# Patient Record
Sex: Female | Born: 1940 | Race: White | Hispanic: No | Marital: Married | Smoking: Never smoker
Health system: Southern US, Community
[De-identification: ages and names within clinical notes are randomized; demographics above are authoritative.]

## PROBLEM LIST (undated history)

## (undated) DIAGNOSIS — K219 Gastro-esophageal reflux disease without esophagitis: Secondary | ICD-10-CM

## (undated) DIAGNOSIS — Z9889 Other specified postprocedural states: Secondary | ICD-10-CM

## (undated) DIAGNOSIS — T7840XA Allergy, unspecified, initial encounter: Secondary | ICD-10-CM

## (undated) DIAGNOSIS — I219 Acute myocardial infarction, unspecified: Secondary | ICD-10-CM

## (undated) DIAGNOSIS — E7800 Pure hypercholesterolemia, unspecified: Secondary | ICD-10-CM

## (undated) DIAGNOSIS — T8859XA Other complications of anesthesia, initial encounter: Secondary | ICD-10-CM

## (undated) DIAGNOSIS — R0600 Dyspnea, unspecified: Secondary | ICD-10-CM

## (undated) DIAGNOSIS — K760 Fatty (change of) liver, not elsewhere classified: Secondary | ICD-10-CM

## (undated) DIAGNOSIS — H3530 Unspecified macular degeneration: Secondary | ICD-10-CM

## (undated) DIAGNOSIS — M1990 Unspecified osteoarthritis, unspecified site: Secondary | ICD-10-CM

## (undated) DIAGNOSIS — Z8051 Family history of malignant neoplasm of kidney: Secondary | ICD-10-CM

## (undated) DIAGNOSIS — G4730 Sleep apnea, unspecified: Secondary | ICD-10-CM

## (undated) DIAGNOSIS — R519 Headache, unspecified: Secondary | ICD-10-CM

## (undated) DIAGNOSIS — R112 Nausea with vomiting, unspecified: Secondary | ICD-10-CM

## (undated) DIAGNOSIS — Z806 Family history of leukemia: Secondary | ICD-10-CM

## (undated) DIAGNOSIS — Z803 Family history of malignant neoplasm of breast: Secondary | ICD-10-CM

## (undated) DIAGNOSIS — C50919 Malignant neoplasm of unspecified site of unspecified female breast: Secondary | ICD-10-CM

## (undated) DIAGNOSIS — I209 Angina pectoris, unspecified: Secondary | ICD-10-CM

## (undated) DIAGNOSIS — Z923 Personal history of irradiation: Secondary | ICD-10-CM

## (undated) DIAGNOSIS — I2510 Atherosclerotic heart disease of native coronary artery without angina pectoris: Secondary | ICD-10-CM

## (undated) DIAGNOSIS — H409 Unspecified glaucoma: Secondary | ICD-10-CM

## (undated) DIAGNOSIS — F419 Anxiety disorder, unspecified: Secondary | ICD-10-CM

## (undated) DIAGNOSIS — I10 Essential (primary) hypertension: Secondary | ICD-10-CM

## (undated) DIAGNOSIS — T4145XA Adverse effect of unspecified anesthetic, initial encounter: Secondary | ICD-10-CM

## (undated) DIAGNOSIS — J45909 Unspecified asthma, uncomplicated: Secondary | ICD-10-CM

## (undated) DIAGNOSIS — Z807 Family history of other malignant neoplasms of lymphoid, hematopoietic and related tissues: Secondary | ICD-10-CM

## (undated) HISTORY — DX: Family history of other malignant neoplasms of lymphoid, hematopoietic and related tissues: Z80.7

## (undated) HISTORY — PX: MASTECTOMY: SHX3

## (undated) HISTORY — DX: Family history of malignant neoplasm of breast: Z80.3

## (undated) HISTORY — DX: Malignant neoplasm of unspecified site of unspecified female breast: C50.919

## (undated) HISTORY — DX: Family history of leukemia: Z80.6

## (undated) HISTORY — PX: ACHILLES TENDON SURGERY: SHX542

## (undated) HISTORY — PX: TRIGGER FINGER RELEASE: SHX641

## (undated) HISTORY — PX: CORONARY ANGIOPLASTY: SHX604

## (undated) HISTORY — PX: OTHER SURGICAL HISTORY: SHX169

## (undated) HISTORY — PX: BUNIONECTOMY: SHX129

## (undated) HISTORY — PX: ABDOMINAL HYSTERECTOMY: SHX81

## (undated) HISTORY — DX: Family history of malignant neoplasm of kidney: Z80.51

---

## 2004-07-12 ENCOUNTER — Ambulatory Visit: Payer: Self-pay

## 2005-08-02 ENCOUNTER — Ambulatory Visit: Payer: Self-pay | Admitting: Internal Medicine

## 2005-08-10 ENCOUNTER — Ambulatory Visit: Payer: Self-pay | Admitting: Internal Medicine

## 2005-11-02 ENCOUNTER — Ambulatory Visit: Payer: Self-pay | Admitting: Gastroenterology

## 2005-11-28 ENCOUNTER — Ambulatory Visit: Payer: Self-pay | Admitting: Internal Medicine

## 2007-08-05 ENCOUNTER — Encounter: Admission: RE | Admit: 2007-08-05 | Discharge: 2007-08-05 | Payer: Self-pay | Admitting: Gynecology

## 2008-02-05 ENCOUNTER — Ambulatory Visit: Payer: Self-pay | Admitting: Internal Medicine

## 2008-05-16 ENCOUNTER — Ambulatory Visit: Payer: Self-pay | Admitting: Internal Medicine

## 2008-11-20 ENCOUNTER — Ambulatory Visit: Payer: Self-pay | Admitting: Family Medicine

## 2009-06-23 ENCOUNTER — Encounter: Payer: Self-pay | Admitting: Internal Medicine

## 2009-06-27 ENCOUNTER — Encounter: Payer: Self-pay | Admitting: Internal Medicine

## 2009-07-06 DIAGNOSIS — R002 Palpitations: Secondary | ICD-10-CM | POA: Insufficient documentation

## 2009-07-06 DIAGNOSIS — M109 Gout, unspecified: Secondary | ICD-10-CM

## 2009-07-06 DIAGNOSIS — I10 Essential (primary) hypertension: Secondary | ICD-10-CM | POA: Insufficient documentation

## 2009-07-08 ENCOUNTER — Ambulatory Visit: Payer: Self-pay | Admitting: Internal Medicine

## 2009-07-08 DIAGNOSIS — I6350 Cerebral infarction due to unspecified occlusion or stenosis of unspecified cerebral artery: Secondary | ICD-10-CM | POA: Insufficient documentation

## 2009-07-14 ENCOUNTER — Telehealth (INDEPENDENT_AMBULATORY_CARE_PROVIDER_SITE_OTHER): Payer: Self-pay | Admitting: *Deleted

## 2009-07-16 ENCOUNTER — Encounter: Admission: RE | Admit: 2009-07-16 | Discharge: 2009-07-16 | Payer: Self-pay | Admitting: Internal Medicine

## 2009-07-19 ENCOUNTER — Telehealth: Payer: Self-pay | Admitting: Internal Medicine

## 2009-08-05 ENCOUNTER — Ambulatory Visit: Payer: Self-pay

## 2009-08-05 ENCOUNTER — Ambulatory Visit: Payer: Self-pay | Admitting: Internal Medicine

## 2009-08-05 ENCOUNTER — Ambulatory Visit: Payer: Self-pay | Admitting: Cardiology

## 2009-08-05 ENCOUNTER — Ambulatory Visit (HOSPITAL_COMMUNITY): Admission: RE | Admit: 2009-08-05 | Discharge: 2009-08-05 | Payer: Self-pay | Admitting: Internal Medicine

## 2009-08-09 ENCOUNTER — Telehealth: Payer: Self-pay | Admitting: Internal Medicine

## 2009-08-10 ENCOUNTER — Encounter: Admission: RE | Admit: 2009-08-10 | Discharge: 2009-08-10 | Payer: Self-pay | Admitting: Gynecology

## 2009-08-10 ENCOUNTER — Telehealth: Payer: Self-pay | Admitting: Internal Medicine

## 2009-08-11 ENCOUNTER — Encounter: Payer: Self-pay | Admitting: Internal Medicine

## 2010-03-19 LAB — CONVERTED CEMR LAB
AST: 18 units/L (ref 0–37)
Alkaline Phosphatase: 65 units/L (ref 39–117)
Bilirubin, Direct: 0.1 mg/dL (ref 0.0–0.3)
Creatinine, Ser: 0.59 mg/dL (ref 0.40–1.20)
Direct LDL: 142.2 mg/dL
Glucose, Bld: 84 mg/dL (ref 70–99)
HDL: 45.9 mg/dL (ref >39.00)
Potassium: 4.5 meq/L (ref 3.5–5.3)
Sodium: 141 meq/L (ref 135–145)
Total CHOL/HDL Ratio: 5
Total Protein: 6.8 g/dL (ref 6.0–8.3)
VLDL: 42.8 mg/dL — ABNORMAL HIGH (ref 0.0–40.0)

## 2010-03-21 NOTE — Progress Notes (Signed)
Summary: [facility]   Imported By: Debby Freiberg 07/07/2009 15:39:45  _____________________________________________________________________  External Attachment:    Type:   Image     Comment:   External Document

## 2010-03-21 NOTE — Assessment & Plan Note (Signed)
Summary: np6/ chestpain-prince medal angina / pt has today options/ gd   Visit Type:  Initial Consult Referring Provider:  dr Champ Mungo (EYES) Primary Provider:  Dr Candelaria Stagers  CC:  headaches-chest pain- sob.  History of Present Illness: Patient is a 70 year old who was referred for evaluation of abnormal eye exam\par The pateitn was recently seen in the [facility] by Dr. Champ Mungo for visual problems.  SHe compained of seeing a gray cloud over her R eye. The patient says that it has improved but is still present.  Eye exam suggests a strricture in the artery feeding the retinal eara consistent with intermittent branch retinal artery occlusio, question temproary vs Prinzmetals.  The patient was told to take ASA SHe denies any other neurologic complaints.. The patient has no known CAD>  She has had episodes of chest pain that starts in the epigastric region.  Can radiate to neck.  Remote.  Has been relieved with Rolaids.  Current Medications (verified): 1)  Aspirin 81 Mg Tbec (Aspirin) .... Take One Tablet By Mouth Daily 2)  Vitamin D 2,000mg  .... Daily 3)  Travatan Z 0.004 % Soln (Travoprost) .... Eye Drops 4)  Metmucil .... Daily  Allergies (verified): 1)  ! * Mycin  Past History:  Past medical, surgical, family and social histories (including risk factors) reviewed, and no changes noted (except as noted below).  Past Medical History: Reviewed history from 07/06/2009 and no changes required. Current Problems:  * GLAUCOMA GOUT, UNSPECIFIED (ICD-274.9) HYPERTENSION (ICD-401.9) PALPITATIONS (ICD-785.1)  Past Surgical History: Reviewed history from 07/06/2009 and no changes required. hystrectomy 1987 achilhes tendon 2003 carpal tunnel 2002  Family History: Reviewed history from 07/06/2009 and no changes required. father deceased cancer age 77 mother deceased age 75 dememtia no siblings  Social History: Reviewed history from 07/06/2009 and no changes  required. retired , married, quit tobacco 1963, drinks occasional alcohol  Review of Systems       All systems reviewed.  Negative to the above problem.  Vital Signs:  Patient profile:   69 year old female Height:      62 inches Weight:      220 pounds BMI:     40.38 Pulse rate:   82 / minute Pulse rhythm:   irregular BP sitting:   119 / 78  (left arm) Cuff size:   large  Vitals Entered By: Burnett Kanaris, CNA (Jul 08, 2009 3:55 PM)  Physical Exam  Additional Exam:  Patient is in NAD HEENT:  Normocephalic, atraumatic. EOMI, PERRLA.  Neck: JVP is normal. No thyromegaly. No bruits.  Lungs: clear to auscultation. No rales no wheezes.  Heart: Regular rate and rhythm. Normal S1, S2. No S3.   No significant murmurs. PMI not displaced.  Abdomen:  Supple, nontender. Normal bowel sounds. No masses. No hepatomegaly.  Extremities:   Good distal pulses throughout. No lower extremity edema.  Musculoskeletal :moving all extremities.  Neuro:   alert and oriented x3.  CN II through XII intact with some cloudiness in fiedl from R eye.    Impression & Recommendations:  Problem # 1:  CVA (ICD-434.91) Ophthy exam with abnormality noted.  Patient without other symptoms.  Wil start with echo, carotd doppler, MRI/MRA brain.  Check lipids, ANA, BMET.    Problem # 2:  HYPERTENSION (ICD-401.9) BP good.  Not on meds.  Other Orders: EKG w/ Interpretation (93000) Carotid Duplex (Carotid Duplex) MRA (MRA) MRI (MRI) T-Basic Metabolic Panel (04540-98119) Echocardiogram (Echo)  Patient Instructions: 1)  Your physician has requested that you have an echocardiogram.  Echocardiography is a painless test that uses sound waves to create images of your heart. It provides your doctor with information about the size and shape of your heart and how well your heart's chambers and valves are working.  This procedure takes approximately one hour. There are no restrictions for this procedure. 2)  Your physician  has requested that you have a carotid duplex. This test is an ultrasound of the carotid arteries in your neck. It looks at blood flow through these arteries that supply the brain with blood. Allow one hour for this exam. There are no restrictions or special instructions. 3)  Your physician recommends that you return for a FASTING lipid profile:  day of echo 785.1 4)  MRI/MRA of brain 5)  Your physician wants you to follow-up in: 12 months  You will receive a reminder letter in the mail two months in advance. If you don't receive a letter, please call our office to schedule the follow-up appointment.   Appended Document: np6/ chestpain-prince medal angina / pt has today options/ gd EKG:  NSR.  82 bpm.  LVH.

## 2010-03-21 NOTE — Letter (Signed)
Summary: [facility] Office Note  [facility] Office Note   Imported By: Roderic Ovens 07/29/2009 11:44:10  _____________________________________________________________________  External Attachment:    Type:   Image     Comment:   External Document

## 2010-03-21 NOTE — Progress Notes (Signed)
  Phone Note Call from Patient Call back at Home Phone [phone] Call back at [phone]   Caller: Patient Reason for Call: Talk to Nurse Summary of Call: Want to know if Dr. Tenny Craw has seen her cartiod report yet and what is the next step.  Pt was in the office today.   Initial call taken by: Omar Person,  August 10, 2009 2:45 PM  Follow-up for Phone Call        Reviewed.  Patient needs referral to ophthy  ([facility]) here in [place].  Forward records.  Will need carotid f/u in 1 year. Follow-up by: Sherrill Raring, MD, [facility],  August 11, 2009 5:13 PM     Appended Document:  Called patient...she is aware. Will find out from Dr.Ross what doc she needs to see at [facility] for retina issue.  Appended Document:  Spoke with Dr. Champ Mungo.  Reviewed tests.  Will fax to him.  No signif abnormality found to explain eye findings. Continue ASA.  Begin Statin.  Patient would like Dr. Arlyce Dice to follow.  Wiill forward records to him as well as Beather Arbour and Dr. Champ Mungo. Pt will need f/u carotid [facility] in [place] in 1 year. F/U cardiology as needed.  Appended Document:  Patient aware of above...she does not want follow up labs here (cancelled appointment), she wants them at Dr.Chaplin's office in [place] ( called his nurse to inform) . All records faxed to Dr.Appenzeller,Dr.Chaplin,and Dr.Charles Lomax per patient's request.

## 2010-03-21 NOTE — Progress Notes (Signed)
Summary: Premed valium for MRI of Brain  Phone Note Call from Patient   Summary of Call: Pre med for MRI/MRA  Follow-up for Phone Call        Pt suffers from claustrophobia and needs Valium prior to procedure.  Called  in 2 doses of Valium 5mg  to pharmacy.   Follow-up by: J Jazzy Parmer RN    New/Updated Medications: VALIUM 5 MG TABS (DIAZEPAM) 1 tab 1 hour prior to MRI and 1 tab at testing center Prescriptions: VALIUM 5 MG TABS (DIAZEPAM) 1 tab 1 hour prior to MRI and 1 tab at testing center  #2 x 0   Entered by:   Layne Benton, RN, BSN   Authorized by:   Sherrill Raring, MD, [facility]   Signed by:   Layne Benton, RN, BSN on 07/14/2009   Method used:   Historical   RxID:   [id number]

## 2010-03-21 NOTE — Progress Notes (Signed)
Summary: MRI/MRA results  Phone Note Call from Patient Call back at [phone]   Caller: Patient Reason for Call: Talk to Nurse, Lab or Test Results Summary of Call: request results of MRI/MRA Initial call taken by: Migdalia Dk,  Jul 19, 2009 2:03 PM  Follow-up for Phone Call        Dr. Tenny Craw reviewing studies today. Follow-up by: Suzan Garibaldi RN  Additional Follow-up for Phone Call Additional follow up Details #1::        Dr.Ifeanyichukwu Wickham called patient with results. Additional Follow-up by: Suzan Garibaldi RN

## 2010-03-21 NOTE — Progress Notes (Signed)
  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 PLEASE ADVISE. Initial call taken by: Scherrie Bateman, LPN,  August 09, 2009 12:01 PM  Follow-up for Phone Call        I have reviewed already. Follow-up by: Sherrill Raring, MD, [facility],  August 09, 2009 3:37 PM     Appended Document:  Annice Pih to discuss with dr Tenny Craw today .  Appended Document:  See note from 08/11/2009.

## 2010-03-21 NOTE — Miscellaneous (Signed)
  Clinical Lists Changes  Medications: Added new medication of CRESTOR 5 MG TABS (ROSUVASTATIN CALCIUM) 1 tab every day - Signed Rx of CRESTOR 5 MG TABS (ROSUVASTATIN CALCIUM) 1 tab every day;  #30 x 4;  Signed;  Entered by: Layne Benton, RN, BSN;  Authorized by: Sherrill Raring, MD, [facility];  Method used: Electronically to [facility] [address], [place], [place]  [address], Ph: [phone], Fax: [phone]    Prescriptions: CRESTOR 5 MG TABS (ROSUVASTATIN CALCIUM) 1 tab every day  #30 x 4   Entered by:   Layne Benton, RN, BSN   Authorized by:   Sherrill Raring, MD, [facility]   Signed by:   Layne Benton, RN, BSN on 08/11/2009   Method used:   Electronically to        [facility]* (retail)       [address] [place], [place]  [address]       Ph: [phone]       Fax: [phone]   RxID:   [id number]

## 2010-04-15 ENCOUNTER — Ambulatory Visit: Payer: No Typology Code available for payment source | Admitting: Internal Medicine

## 2010-07-07 ENCOUNTER — Other Ambulatory Visit: Payer: Self-pay | Admitting: *Deleted

## 2010-07-07 MED ORDER — ROSUVASTATIN CALCIUM 5 MG PO TABS: 5.0000 mg | ORAL_TABLET | Freq: Every day | ORAL | Status: DC

## 2010-10-18 ENCOUNTER — Other Ambulatory Visit: Payer: Self-pay | Admitting: Gynecology

## 2010-10-18 DIAGNOSIS — Z1231 Encounter for screening mammogram for malignant neoplasm of breast: Secondary | ICD-10-CM

## 2010-11-01 ENCOUNTER — Ambulatory Visit
Admission: RE | Admit: 2010-11-01 | Discharge: 2010-11-01 | Disposition: A | Discharge status: Home / Self Care | Payer: No Typology Code available for payment source | Source: Ambulatory Visit | Attending: Gynecology | Admitting: Gynecology

## 2010-11-01 DIAGNOSIS — Z1231 Encounter for screening mammogram for malignant neoplasm of breast: Secondary | ICD-10-CM

## 2010-12-01 ENCOUNTER — Ambulatory Visit: Payer: No Typology Code available for payment source | Admitting: Urology

## 2010-12-05 ENCOUNTER — Ambulatory Visit: Payer: No Typology Code available for payment source | Admitting: Urology

## 2011-02-04 ENCOUNTER — Ambulatory Visit: Payer: No Typology Code available for payment source | Admitting: Internal Medicine

## 2011-05-09 ENCOUNTER — Ambulatory Visit: Payer: Self-pay

## 2011-08-11 ENCOUNTER — Emergency Department: Payer: Self-pay | Admitting: Emergency Medicine

## 2011-08-11 LAB — CBC
HGB: 16.8 g/dL — ABNORMAL HIGH (ref 12.0–16.0)
MCV: 93 fL (ref 80–100)
RBC: 5.21 10*6/uL — ABNORMAL HIGH (ref 3.80–5.20)
RDW: 12.8 % (ref 11.5–14.5)

## 2011-08-11 LAB — COMPREHENSIVE METABOLIC PANEL
Albumin: 3.8 g/dL (ref 3.4–5.0)
Alkaline Phosphatase: 72 U/L (ref 50–136)
Anion Gap: 10 (ref 7–16)
BUN: 15 mg/dL (ref 7–18)
Calcium, Total: 9 mg/dL (ref 8.5–10.1)
Chloride: 103 mmol/L (ref 98–107)
Co2: 26 mmol/L (ref 21–32)
EGFR (African American): >60
EGFR (Non-African Amer.): >60
SGPT (ALT): 32 U/L
Total Protein: 8.1 g/dL (ref 6.4–8.2)

## 2011-08-11 LAB — LIPASE, BLOOD: Lipase: 70 U/L — ABNORMAL LOW (ref 73–393)

## 2011-08-12 LAB — URINALYSIS, COMPLETE
Glucose,UR: 50 mg/dL (ref 0–75)
Leukocyte Esterase: NEGATIVE
Nitrite: NEGATIVE
Ph: 5 (ref 4.5–8.0)
Protein: 100
Specific Gravity: 1.033 (ref 1.003–1.030)
Squamous Epithelial: 3

## 2011-08-30 ENCOUNTER — Ambulatory Visit: Payer: Self-pay | Admitting: Unknown Physician Specialty

## 2011-11-05 ENCOUNTER — Other Ambulatory Visit: Payer: Self-pay | Admitting: Gynecology

## 2011-11-20 ENCOUNTER — Other Ambulatory Visit: Payer: Self-pay | Admitting: Gynecology

## 2011-11-20 DIAGNOSIS — Z1231 Encounter for screening mammogram for malignant neoplasm of breast: Secondary | ICD-10-CM

## 2011-12-25 ENCOUNTER — Ambulatory Visit
Admission: RE | Admit: 2011-12-25 | Discharge: 2011-12-25 | Disposition: A | Discharge status: Home / Self Care | Payer: Medicare Other | Source: Ambulatory Visit | Attending: Gynecology | Admitting: Gynecology

## 2011-12-25 DIAGNOSIS — Z1231 Encounter for screening mammogram for malignant neoplasm of breast: Secondary | ICD-10-CM

## 2012-03-31 ENCOUNTER — Ambulatory Visit: Payer: Self-pay | Admitting: Unknown Physician Specialty

## 2012-04-01 LAB — PATHOLOGY REPORT

## 2012-07-24 DIAGNOSIS — M545 Low back pain: Secondary | ICD-10-CM | POA: Insufficient documentation

## 2012-08-21 DIAGNOSIS — M654 Radial styloid tenosynovitis [de Quervain]: Secondary | ICD-10-CM | POA: Insufficient documentation

## 2012-12-26 ENCOUNTER — Other Ambulatory Visit: Payer: Self-pay

## 2012-12-26 DIAGNOSIS — Z1231 Encounter for screening mammogram for malignant neoplasm of breast: Secondary | ICD-10-CM

## 2013-01-27 ENCOUNTER — Ambulatory Visit: Payer: Medicare Other

## 2013-03-03 ENCOUNTER — Ambulatory Visit
Admission: RE | Admit: 2013-03-03 | Discharge: 2013-03-03 | Disposition: A | Discharge status: Home / Self Care | Payer: Medicare HMO | Source: Ambulatory Visit

## 2013-03-03 DIAGNOSIS — Z1231 Encounter for screening mammogram for malignant neoplasm of breast: Secondary | ICD-10-CM

## 2013-08-10 DIAGNOSIS — M5416 Radiculopathy, lumbar region: Secondary | ICD-10-CM | POA: Insufficient documentation

## 2013-08-10 DIAGNOSIS — M5136 Other intervertebral disc degeneration, lumbar region: Secondary | ICD-10-CM | POA: Insufficient documentation

## 2013-08-10 DIAGNOSIS — M51369 Other intervertebral disc degeneration, lumbar region without mention of lumbar back pain or lower extremity pain: Secondary | ICD-10-CM | POA: Insufficient documentation

## 2014-03-01 DIAGNOSIS — H5201 Hypermetropia, right eye: Secondary | ICD-10-CM | POA: Diagnosis not present

## 2014-03-01 DIAGNOSIS — H3531 Nonexudative age-related macular degeneration: Secondary | ICD-10-CM | POA: Diagnosis not present

## 2014-03-01 DIAGNOSIS — H401231 Low-tension glaucoma, bilateral, mild stage: Secondary | ICD-10-CM | POA: Diagnosis not present

## 2014-03-01 DIAGNOSIS — H5212 Myopia, left eye: Secondary | ICD-10-CM | POA: Diagnosis not present

## 2014-03-01 DIAGNOSIS — H2513 Age-related nuclear cataract, bilateral: Secondary | ICD-10-CM | POA: Diagnosis not present

## 2014-03-01 DIAGNOSIS — H52222 Regular astigmatism, left eye: Secondary | ICD-10-CM | POA: Diagnosis not present

## 2014-03-01 DIAGNOSIS — H524 Presbyopia: Secondary | ICD-10-CM | POA: Diagnosis not present

## 2014-04-09 DIAGNOSIS — L578 Other skin changes due to chronic exposure to nonionizing radiation: Secondary | ICD-10-CM | POA: Diagnosis not present

## 2014-04-09 DIAGNOSIS — D229 Melanocytic nevi, unspecified: Secondary | ICD-10-CM | POA: Diagnosis not present

## 2014-04-09 DIAGNOSIS — Z1283 Encounter for screening for malignant neoplasm of skin: Secondary | ICD-10-CM | POA: Diagnosis not present

## 2014-04-09 DIAGNOSIS — L853 Xerosis cutis: Secondary | ICD-10-CM | POA: Diagnosis not present

## 2014-04-09 DIAGNOSIS — D1800 Hemangioma unspecified site: Secondary | ICD-10-CM | POA: Diagnosis not present

## 2014-04-09 DIAGNOSIS — L570 Actinic keratosis: Secondary | ICD-10-CM | POA: Diagnosis not present

## 2014-04-09 DIAGNOSIS — L820 Inflamed seborrheic keratosis: Secondary | ICD-10-CM | POA: Diagnosis not present

## 2014-04-09 DIAGNOSIS — L821 Other seborrheic keratosis: Secondary | ICD-10-CM | POA: Diagnosis not present

## 2014-06-17 ENCOUNTER — Other Ambulatory Visit: Payer: Self-pay | Admitting: Physical Medicine and Rehabilitation

## 2014-06-17 DIAGNOSIS — M545 Low back pain: Secondary | ICD-10-CM

## 2014-07-01 DIAGNOSIS — M5441 Lumbago with sciatica, right side: Secondary | ICD-10-CM | POA: Diagnosis not present

## 2014-07-05 DIAGNOSIS — M5441 Lumbago with sciatica, right side: Secondary | ICD-10-CM | POA: Diagnosis not present

## 2014-07-06 ENCOUNTER — Ambulatory Visit
Admission: RE | Admit: 2014-07-06 | Discharge: 2014-07-06 | Disposition: A | Discharge status: Home / Self Care | Payer: Commercial Managed Care - HMO | Source: Ambulatory Visit | Attending: Physical Medicine and Rehabilitation | Admitting: Physical Medicine and Rehabilitation

## 2014-07-06 DIAGNOSIS — M5127 Other intervertebral disc displacement, lumbosacral region: Secondary | ICD-10-CM | POA: Diagnosis not present

## 2014-07-06 DIAGNOSIS — M545 Low back pain: Secondary | ICD-10-CM

## 2014-07-08 DIAGNOSIS — M5441 Lumbago with sciatica, right side: Secondary | ICD-10-CM | POA: Diagnosis not present

## 2014-07-13 DIAGNOSIS — M5416 Radiculopathy, lumbar region: Secondary | ICD-10-CM | POA: Diagnosis not present

## 2014-07-13 DIAGNOSIS — M5136 Other intervertebral disc degeneration, lumbar region: Secondary | ICD-10-CM | POA: Diagnosis not present

## 2014-07-14 DIAGNOSIS — R5383 Other fatigue: Secondary | ICD-10-CM | POA: Diagnosis not present

## 2014-07-14 DIAGNOSIS — R6883 Chills (without fever): Secondary | ICD-10-CM | POA: Diagnosis not present

## 2014-07-14 DIAGNOSIS — E780 Pure hypercholesterolemia: Secondary | ICD-10-CM | POA: Diagnosis not present

## 2014-07-15 DIAGNOSIS — R6883 Chills (without fever): Secondary | ICD-10-CM | POA: Diagnosis not present

## 2014-07-15 DIAGNOSIS — E780 Pure hypercholesterolemia: Secondary | ICD-10-CM | POA: Diagnosis not present

## 2014-07-15 DIAGNOSIS — R5383 Other fatigue: Secondary | ICD-10-CM | POA: Diagnosis not present

## 2014-07-16 DIAGNOSIS — R5383 Other fatigue: Secondary | ICD-10-CM | POA: Diagnosis not present

## 2014-07-16 DIAGNOSIS — R6883 Chills (without fever): Secondary | ICD-10-CM | POA: Diagnosis not present

## 2014-07-16 DIAGNOSIS — E780 Pure hypercholesterolemia: Secondary | ICD-10-CM | POA: Diagnosis not present

## 2014-07-20 DIAGNOSIS — M5441 Lumbago with sciatica, right side: Secondary | ICD-10-CM | POA: Diagnosis not present

## 2014-07-21 DIAGNOSIS — H2513 Age-related nuclear cataract, bilateral: Secondary | ICD-10-CM | POA: Diagnosis not present

## 2014-07-21 DIAGNOSIS — H401231 Low-tension glaucoma, bilateral, mild stage: Secondary | ICD-10-CM | POA: Diagnosis not present

## 2014-07-21 DIAGNOSIS — H5201 Hypermetropia, right eye: Secondary | ICD-10-CM | POA: Diagnosis not present

## 2014-07-21 DIAGNOSIS — H52222 Regular astigmatism, left eye: Secondary | ICD-10-CM | POA: Diagnosis not present

## 2014-07-21 DIAGNOSIS — H524 Presbyopia: Secondary | ICD-10-CM | POA: Diagnosis not present

## 2014-07-21 DIAGNOSIS — H3531 Nonexudative age-related macular degeneration: Secondary | ICD-10-CM | POA: Diagnosis not present

## 2014-07-21 DIAGNOSIS — H5212 Myopia, left eye: Secondary | ICD-10-CM | POA: Diagnosis not present

## 2014-07-23 ENCOUNTER — Other Ambulatory Visit: Payer: Self-pay

## 2014-07-23 DIAGNOSIS — Z1231 Encounter for screening mammogram for malignant neoplasm of breast: Secondary | ICD-10-CM

## 2014-07-23 DIAGNOSIS — M5441 Lumbago with sciatica, right side: Secondary | ICD-10-CM | POA: Diagnosis not present

## 2014-07-30 ENCOUNTER — Ambulatory Visit: Payer: Commercial Managed Care - HMO

## 2014-08-24 DIAGNOSIS — H40009 Preglaucoma, unspecified, unspecified eye: Secondary | ICD-10-CM | POA: Diagnosis not present

## 2014-08-26 DIAGNOSIS — M5416 Radiculopathy, lumbar region: Secondary | ICD-10-CM | POA: Diagnosis not present

## 2014-08-26 DIAGNOSIS — M5136 Other intervertebral disc degeneration, lumbar region: Secondary | ICD-10-CM | POA: Diagnosis not present

## 2014-09-01 ENCOUNTER — Ambulatory Visit
Admission: RE | Admit: 2014-09-01 | Discharge: 2014-09-01 | Disposition: A | Discharge status: Home / Self Care | Payer: Commercial Managed Care - HMO | Source: Ambulatory Visit

## 2014-09-01 DIAGNOSIS — Z1231 Encounter for screening mammogram for malignant neoplasm of breast: Secondary | ICD-10-CM | POA: Diagnosis not present

## 2014-12-15 DIAGNOSIS — H40013 Open angle with borderline findings, low risk, bilateral: Secondary | ICD-10-CM | POA: Diagnosis not present

## 2015-01-19 DIAGNOSIS — E538 Deficiency of other specified B group vitamins: Secondary | ICD-10-CM | POA: Diagnosis not present

## 2015-01-19 DIAGNOSIS — Z0000 Encounter for general adult medical examination without abnormal findings: Secondary | ICD-10-CM | POA: Diagnosis not present

## 2015-01-19 DIAGNOSIS — F411 Generalized anxiety disorder: Secondary | ICD-10-CM | POA: Insufficient documentation

## 2015-01-19 DIAGNOSIS — E7800 Pure hypercholesterolemia, unspecified: Secondary | ICD-10-CM | POA: Diagnosis not present

## 2015-01-25 DIAGNOSIS — E7800 Pure hypercholesterolemia, unspecified: Secondary | ICD-10-CM | POA: Diagnosis not present

## 2015-01-25 DIAGNOSIS — E538 Deficiency of other specified B group vitamins: Secondary | ICD-10-CM | POA: Diagnosis not present

## 2015-03-09 DIAGNOSIS — E7800 Pure hypercholesterolemia, unspecified: Secondary | ICD-10-CM | POA: Diagnosis not present

## 2015-03-09 DIAGNOSIS — I10 Essential (primary) hypertension: Secondary | ICD-10-CM | POA: Diagnosis not present

## 2015-03-09 DIAGNOSIS — F411 Generalized anxiety disorder: Secondary | ICD-10-CM | POA: Diagnosis not present

## 2015-03-16 DIAGNOSIS — H40013 Open angle with borderline findings, low risk, bilateral: Secondary | ICD-10-CM | POA: Diagnosis not present

## 2015-03-25 DIAGNOSIS — R509 Fever, unspecified: Secondary | ICD-10-CM | POA: Diagnosis not present

## 2015-04-11 DIAGNOSIS — L578 Other skin changes due to chronic exposure to nonionizing radiation: Secondary | ICD-10-CM | POA: Diagnosis not present

## 2015-04-11 DIAGNOSIS — I788 Other diseases of capillaries: Secondary | ICD-10-CM | POA: Diagnosis not present

## 2015-04-11 DIAGNOSIS — L720 Epidermal cyst: Secondary | ICD-10-CM | POA: Diagnosis not present

## 2015-04-11 DIAGNOSIS — L853 Xerosis cutis: Secondary | ICD-10-CM | POA: Diagnosis not present

## 2015-04-11 DIAGNOSIS — D1800 Hemangioma unspecified site: Secondary | ICD-10-CM | POA: Diagnosis not present

## 2015-04-11 DIAGNOSIS — L820 Inflamed seborrheic keratosis: Secondary | ICD-10-CM | POA: Diagnosis not present

## 2015-04-11 DIAGNOSIS — L821 Other seborrheic keratosis: Secondary | ICD-10-CM | POA: Diagnosis not present

## 2015-04-11 DIAGNOSIS — L814 Other melanin hyperpigmentation: Secondary | ICD-10-CM | POA: Diagnosis not present

## 2015-04-11 DIAGNOSIS — Z1283 Encounter for screening for malignant neoplasm of skin: Secondary | ICD-10-CM | POA: Diagnosis not present

## 2015-06-15 DIAGNOSIS — H9313 Tinnitus, bilateral: Secondary | ICD-10-CM | POA: Diagnosis not present

## 2015-06-15 DIAGNOSIS — E7800 Pure hypercholesterolemia, unspecified: Secondary | ICD-10-CM | POA: Diagnosis not present

## 2015-06-15 DIAGNOSIS — M546 Pain in thoracic spine: Secondary | ICD-10-CM | POA: Diagnosis not present

## 2015-06-15 DIAGNOSIS — I10 Essential (primary) hypertension: Secondary | ICD-10-CM | POA: Diagnosis not present

## 2015-06-16 DIAGNOSIS — E7800 Pure hypercholesterolemia, unspecified: Secondary | ICD-10-CM | POA: Diagnosis not present

## 2015-06-16 DIAGNOSIS — I10 Essential (primary) hypertension: Secondary | ICD-10-CM | POA: Diagnosis not present

## 2015-09-13 DIAGNOSIS — H40013 Open angle with borderline findings, low risk, bilateral: Secondary | ICD-10-CM | POA: Diagnosis not present

## 2015-09-21 DIAGNOSIS — H40013 Open angle with borderline findings, low risk, bilateral: Secondary | ICD-10-CM | POA: Diagnosis not present

## 2015-10-07 DIAGNOSIS — I10 Essential (primary) hypertension: Secondary | ICD-10-CM | POA: Diagnosis not present

## 2015-10-07 DIAGNOSIS — E7800 Pure hypercholesterolemia, unspecified: Secondary | ICD-10-CM | POA: Diagnosis not present

## 2015-10-19 DIAGNOSIS — I10 Essential (primary) hypertension: Secondary | ICD-10-CM | POA: Diagnosis not present

## 2015-10-19 DIAGNOSIS — E6609 Other obesity due to excess calories: Secondary | ICD-10-CM | POA: Diagnosis not present

## 2015-10-19 DIAGNOSIS — E782 Mixed hyperlipidemia: Secondary | ICD-10-CM | POA: Diagnosis not present

## 2015-10-19 DIAGNOSIS — F411 Generalized anxiety disorder: Secondary | ICD-10-CM | POA: Diagnosis not present

## 2015-10-19 DIAGNOSIS — K219 Gastro-esophageal reflux disease without esophagitis: Secondary | ICD-10-CM | POA: Diagnosis not present

## 2015-10-19 DIAGNOSIS — E538 Deficiency of other specified B group vitamins: Secondary | ICD-10-CM | POA: Diagnosis not present

## 2015-12-06 DIAGNOSIS — M5126 Other intervertebral disc displacement, lumbar region: Secondary | ICD-10-CM | POA: Diagnosis not present

## 2015-12-06 DIAGNOSIS — M5416 Radiculopathy, lumbar region: Secondary | ICD-10-CM | POA: Diagnosis not present

## 2016-01-16 DIAGNOSIS — J069 Acute upper respiratory infection, unspecified: Secondary | ICD-10-CM | POA: Diagnosis not present

## 2016-02-03 DIAGNOSIS — M5126 Other intervertebral disc displacement, lumbar region: Secondary | ICD-10-CM | POA: Diagnosis not present

## 2016-02-03 DIAGNOSIS — M5416 Radiculopathy, lumbar region: Secondary | ICD-10-CM | POA: Diagnosis not present

## 2016-02-03 DIAGNOSIS — M170 Bilateral primary osteoarthritis of knee: Secondary | ICD-10-CM | POA: Diagnosis not present

## 2016-02-09 DIAGNOSIS — I10 Essential (primary) hypertension: Secondary | ICD-10-CM | POA: Diagnosis not present

## 2016-02-09 DIAGNOSIS — E538 Deficiency of other specified B group vitamins: Secondary | ICD-10-CM | POA: Diagnosis not present

## 2016-02-09 DIAGNOSIS — E782 Mixed hyperlipidemia: Secondary | ICD-10-CM | POA: Diagnosis not present

## 2016-02-15 DIAGNOSIS — I10 Essential (primary) hypertension: Secondary | ICD-10-CM | POA: Diagnosis not present

## 2016-02-15 DIAGNOSIS — Z23 Encounter for immunization: Secondary | ICD-10-CM | POA: Diagnosis not present

## 2016-02-15 DIAGNOSIS — Z0000 Encounter for general adult medical examination without abnormal findings: Secondary | ICD-10-CM | POA: Diagnosis not present

## 2016-02-15 DIAGNOSIS — E6609 Other obesity due to excess calories: Secondary | ICD-10-CM | POA: Diagnosis not present

## 2016-02-15 DIAGNOSIS — E782 Mixed hyperlipidemia: Secondary | ICD-10-CM | POA: Diagnosis not present

## 2016-02-15 DIAGNOSIS — Z780 Asymptomatic menopausal state: Secondary | ICD-10-CM | POA: Diagnosis not present

## 2016-02-16 DIAGNOSIS — M8588 Other specified disorders of bone density and structure, other site: Secondary | ICD-10-CM | POA: Diagnosis not present

## 2016-02-17 DIAGNOSIS — M8589 Other specified disorders of bone density and structure, multiple sites: Secondary | ICD-10-CM | POA: Insufficient documentation

## 2016-03-20 DIAGNOSIS — H40013 Open angle with borderline findings, low risk, bilateral: Secondary | ICD-10-CM | POA: Diagnosis not present

## 2016-03-21 DIAGNOSIS — E559 Vitamin D deficiency, unspecified: Secondary | ICD-10-CM | POA: Diagnosis not present

## 2016-03-21 DIAGNOSIS — M8580 Other specified disorders of bone density and structure, unspecified site: Secondary | ICD-10-CM | POA: Diagnosis not present

## 2016-03-21 DIAGNOSIS — E538 Deficiency of other specified B group vitamins: Secondary | ICD-10-CM | POA: Diagnosis not present

## 2016-03-22 DIAGNOSIS — E559 Vitamin D deficiency, unspecified: Secondary | ICD-10-CM | POA: Insufficient documentation

## 2016-04-10 DIAGNOSIS — L814 Other melanin hyperpigmentation: Secondary | ICD-10-CM | POA: Diagnosis not present

## 2016-04-10 DIAGNOSIS — L578 Other skin changes due to chronic exposure to nonionizing radiation: Secondary | ICD-10-CM | POA: Diagnosis not present

## 2016-04-10 DIAGNOSIS — L720 Epidermal cyst: Secondary | ICD-10-CM | POA: Diagnosis not present

## 2016-04-10 DIAGNOSIS — L219 Seborrheic dermatitis, unspecified: Secondary | ICD-10-CM | POA: Diagnosis not present

## 2016-04-10 DIAGNOSIS — Z1283 Encounter for screening for malignant neoplasm of skin: Secondary | ICD-10-CM | POA: Diagnosis not present

## 2016-04-10 DIAGNOSIS — L820 Inflamed seborrheic keratosis: Secondary | ICD-10-CM | POA: Diagnosis not present

## 2016-04-10 DIAGNOSIS — D1800 Hemangioma unspecified site: Secondary | ICD-10-CM | POA: Diagnosis not present

## 2016-04-10 DIAGNOSIS — I788 Other diseases of capillaries: Secondary | ICD-10-CM | POA: Diagnosis not present

## 2016-04-10 DIAGNOSIS — L821 Other seborrheic keratosis: Secondary | ICD-10-CM | POA: Diagnosis not present

## 2016-04-25 DIAGNOSIS — M205X2 Other deformities of toe(s) (acquired), left foot: Secondary | ICD-10-CM | POA: Diagnosis not present

## 2016-04-25 DIAGNOSIS — B351 Tinea unguium: Secondary | ICD-10-CM | POA: Diagnosis not present

## 2016-04-25 DIAGNOSIS — M79674 Pain in right toe(s): Secondary | ICD-10-CM | POA: Diagnosis not present

## 2016-04-25 DIAGNOSIS — M79675 Pain in left toe(s): Secondary | ICD-10-CM | POA: Diagnosis not present

## 2016-04-25 DIAGNOSIS — M205X1 Other deformities of toe(s) (acquired), right foot: Secondary | ICD-10-CM | POA: Diagnosis not present

## 2016-06-07 DIAGNOSIS — R0602 Shortness of breath: Secondary | ICD-10-CM | POA: Diagnosis not present

## 2016-06-07 DIAGNOSIS — J40 Bronchitis, not specified as acute or chronic: Secondary | ICD-10-CM | POA: Diagnosis not present

## 2016-06-07 DIAGNOSIS — E559 Vitamin D deficiency, unspecified: Secondary | ICD-10-CM | POA: Diagnosis not present

## 2016-06-07 DIAGNOSIS — R05 Cough: Secondary | ICD-10-CM | POA: Diagnosis not present

## 2016-06-07 DIAGNOSIS — R5383 Other fatigue: Secondary | ICD-10-CM | POA: Diagnosis not present

## 2016-06-12 DIAGNOSIS — E782 Mixed hyperlipidemia: Secondary | ICD-10-CM | POA: Diagnosis not present

## 2016-06-12 DIAGNOSIS — J40 Bronchitis, not specified as acute or chronic: Secondary | ICD-10-CM | POA: Diagnosis not present

## 2016-06-12 DIAGNOSIS — R0602 Shortness of breath: Secondary | ICD-10-CM | POA: Diagnosis not present

## 2016-06-12 DIAGNOSIS — R5383 Other fatigue: Secondary | ICD-10-CM | POA: Diagnosis not present

## 2016-06-12 DIAGNOSIS — E559 Vitamin D deficiency, unspecified: Secondary | ICD-10-CM | POA: Diagnosis not present

## 2016-06-15 DIAGNOSIS — E6609 Other obesity due to excess calories: Secondary | ICD-10-CM | POA: Diagnosis not present

## 2016-06-15 DIAGNOSIS — L749 Eccrine sweat disorder, unspecified: Secondary | ICD-10-CM | POA: Diagnosis not present

## 2016-06-15 DIAGNOSIS — E782 Mixed hyperlipidemia: Secondary | ICD-10-CM | POA: Diagnosis not present

## 2016-06-15 DIAGNOSIS — E559 Vitamin D deficiency, unspecified: Secondary | ICD-10-CM | POA: Diagnosis not present

## 2016-06-15 DIAGNOSIS — I10 Essential (primary) hypertension: Secondary | ICD-10-CM | POA: Diagnosis not present

## 2016-06-18 DIAGNOSIS — L749 Eccrine sweat disorder, unspecified: Secondary | ICD-10-CM | POA: Diagnosis not present

## 2016-06-22 DIAGNOSIS — R350 Frequency of micturition: Secondary | ICD-10-CM | POA: Diagnosis not present

## 2016-06-22 DIAGNOSIS — R300 Dysuria: Secondary | ICD-10-CM | POA: Diagnosis not present

## 2016-06-25 DIAGNOSIS — M5126 Other intervertebral disc displacement, lumbar region: Secondary | ICD-10-CM | POA: Diagnosis not present

## 2016-06-25 DIAGNOSIS — N390 Urinary tract infection, site not specified: Secondary | ICD-10-CM | POA: Diagnosis not present

## 2016-06-25 DIAGNOSIS — M5416 Radiculopathy, lumbar region: Secondary | ICD-10-CM | POA: Diagnosis not present

## 2016-06-25 DIAGNOSIS — R079 Chest pain, unspecified: Secondary | ICD-10-CM | POA: Diagnosis not present

## 2016-07-04 DIAGNOSIS — M79675 Pain in left toe(s): Secondary | ICD-10-CM | POA: Diagnosis not present

## 2016-07-04 DIAGNOSIS — M79674 Pain in right toe(s): Secondary | ICD-10-CM | POA: Diagnosis not present

## 2016-07-06 DIAGNOSIS — R0789 Other chest pain: Secondary | ICD-10-CM | POA: Diagnosis not present

## 2016-07-06 DIAGNOSIS — R079 Chest pain, unspecified: Secondary | ICD-10-CM | POA: Diagnosis not present

## 2016-07-06 DIAGNOSIS — R9439 Abnormal result of other cardiovascular function study: Secondary | ICD-10-CM | POA: Diagnosis not present

## 2016-07-10 DIAGNOSIS — Z8249 Family history of ischemic heart disease and other diseases of the circulatory system: Secondary | ICD-10-CM | POA: Diagnosis not present

## 2016-07-10 DIAGNOSIS — E669 Obesity, unspecified: Secondary | ICD-10-CM | POA: Diagnosis not present

## 2016-07-10 DIAGNOSIS — I10 Essential (primary) hypertension: Secondary | ICD-10-CM | POA: Diagnosis not present

## 2016-07-10 DIAGNOSIS — R0602 Shortness of breath: Secondary | ICD-10-CM | POA: Diagnosis not present

## 2016-07-10 DIAGNOSIS — Z8659 Personal history of other mental and behavioral disorders: Secondary | ICD-10-CM | POA: Diagnosis not present

## 2016-07-10 DIAGNOSIS — R9439 Abnormal result of other cardiovascular function study: Secondary | ICD-10-CM | POA: Diagnosis not present

## 2016-07-10 DIAGNOSIS — I208 Other forms of angina pectoris: Secondary | ICD-10-CM | POA: Diagnosis not present

## 2016-07-17 NOTE — OR Nursing (Signed)
Notified taurus at dr. Etta Quill office that patient has no orders entered for procedure tomorrow

## 2016-07-18 ENCOUNTER — Encounter: Payer: Self-pay | Admitting: *Deleted

## 2016-07-18 ENCOUNTER — Observation Stay
Admission: AD | Admit: 2016-07-18 | Discharge: 2016-07-19 | Disposition: A | Discharge status: Home / Self Care | Payer: Medicare HMO | Source: Ambulatory Visit | Attending: Internal Medicine | Admitting: Internal Medicine

## 2016-07-18 ENCOUNTER — Encounter
Admission: AD | Disposition: A | Discharge status: Home / Self Care | Payer: Self-pay | Source: Ambulatory Visit | Attending: Internal Medicine

## 2016-07-18 DIAGNOSIS — Z823 Family history of stroke: Secondary | ICD-10-CM | POA: Diagnosis not present

## 2016-07-18 DIAGNOSIS — I25118 Atherosclerotic heart disease of native coronary artery with other forms of angina pectoris: Secondary | ICD-10-CM | POA: Diagnosis not present

## 2016-07-18 DIAGNOSIS — Z7982 Long term (current) use of aspirin: Secondary | ICD-10-CM | POA: Insufficient documentation

## 2016-07-18 DIAGNOSIS — I10 Essential (primary) hypertension: Secondary | ICD-10-CM | POA: Diagnosis not present

## 2016-07-18 DIAGNOSIS — R0602 Shortness of breath: Secondary | ICD-10-CM | POA: Insufficient documentation

## 2016-07-18 DIAGNOSIS — Z803 Family history of malignant neoplasm of breast: Secondary | ICD-10-CM | POA: Insufficient documentation

## 2016-07-18 DIAGNOSIS — K760 Fatty (change of) liver, not elsewhere classified: Secondary | ICD-10-CM | POA: Insufficient documentation

## 2016-07-18 DIAGNOSIS — Z8249 Family history of ischemic heart disease and other diseases of the circulatory system: Secondary | ICD-10-CM | POA: Insufficient documentation

## 2016-07-18 DIAGNOSIS — Z79899 Other long term (current) drug therapy: Secondary | ICD-10-CM | POA: Diagnosis not present

## 2016-07-18 DIAGNOSIS — Z6837 Body mass index (BMI) 37.0-37.9, adult: Secondary | ICD-10-CM | POA: Diagnosis not present

## 2016-07-18 DIAGNOSIS — H409 Unspecified glaucoma: Secondary | ICD-10-CM | POA: Insufficient documentation

## 2016-07-18 DIAGNOSIS — E7800 Pure hypercholesterolemia, unspecified: Secondary | ICD-10-CM | POA: Diagnosis not present

## 2016-07-18 DIAGNOSIS — R9439 Abnormal result of other cardiovascular function study: Secondary | ICD-10-CM | POA: Diagnosis not present

## 2016-07-18 DIAGNOSIS — E669 Obesity, unspecified: Secondary | ICD-10-CM | POA: Insufficient documentation

## 2016-07-18 DIAGNOSIS — Z90710 Acquired absence of both cervix and uterus: Secondary | ICD-10-CM | POA: Diagnosis not present

## 2016-07-18 DIAGNOSIS — Z881 Allergy status to other antibiotic agents status: Secondary | ICD-10-CM | POA: Diagnosis not present

## 2016-07-18 DIAGNOSIS — E538 Deficiency of other specified B group vitamins: Secondary | ICD-10-CM | POA: Diagnosis not present

## 2016-07-18 DIAGNOSIS — Z955 Presence of coronary angioplasty implant and graft: Secondary | ICD-10-CM

## 2016-07-18 DIAGNOSIS — I200 Unstable angina: Secondary | ICD-10-CM | POA: Diagnosis not present

## 2016-07-18 DIAGNOSIS — R931 Abnormal findings on diagnostic imaging of heart and coronary circulation: Secondary | ICD-10-CM | POA: Diagnosis present

## 2016-07-18 HISTORY — PX: CORONARY PRESSURE/FFR STUDY: CATH118243

## 2016-07-18 HISTORY — PX: LEFT HEART CATH AND CORONARY ANGIOGRAPHY: CATH118249

## 2016-07-18 HISTORY — PX: CORONARY STENT INTERVENTION: CATH118234

## 2016-07-18 LAB — POCT ACTIVATED CLOTTING TIME: Activated Clotting Time: 345 seconds

## 2016-07-18 LAB — CARDIAC CATHETERIZATION: CATHEFQUANT: 55 %

## 2016-07-18 SURGERY — RIGHT/LEFT HEART CATH AND CORONARY ANGIOGRAPHY
Anesthesia: Moderate Sedation

## 2016-07-18 SURGERY — LEFT HEART CATH AND CORONARY ANGIOGRAPHY
Anesthesia: Moderate Sedation

## 2016-07-18 MED ORDER — TICAGRELOR 90 MG PO TABS
ORAL_TABLET | ORAL | Status: DC | PRN
  Administered 2016-07-18: 180 mg via ORAL

## 2016-07-18 MED ORDER — ONDANSETRON HCL 4 MG/2ML IJ SOLN: 4.0000 mg | Freq: Four times a day (QID) | INTRAMUSCULAR | Status: DC | PRN

## 2016-07-18 MED ORDER — ACETAMINOPHEN 325 MG PO TABS: 650.0000 mg | ORAL_TABLET | ORAL | Status: DC | PRN

## 2016-07-18 MED ORDER — BIVALIRUDIN BOLUS VIA INFUSION - CUPID
INTRAVENOUS | Status: DC | PRN
  Administered 2016-07-18: 74.85 mg via INTRAVENOUS

## 2016-07-18 MED ORDER — MIDAZOLAM HCL 2 MG/2ML IJ SOLN
INTRAMUSCULAR | Status: AC
  Filled 2016-07-18: qty 2

## 2016-07-18 MED ORDER — ADENOSINE (DIAGNOSTIC) 3 MG/ML IV SOLN
INTRAVENOUS | Status: AC
  Filled 2016-07-18: qty 30

## 2016-07-18 MED ORDER — MIDAZOLAM HCL 2 MG/2ML IJ SOLN
INTRAMUSCULAR | Status: DC | PRN
  Administered 2016-07-18: 0.5 mg via INTRAVENOUS
  Administered 2016-07-18: 1 mg via INTRAVENOUS

## 2016-07-18 MED ORDER — IOPAMIDOL (ISOVUE-300) INJECTION 61%
INTRAVENOUS | Status: DC | PRN
Start: 2016-07-18 — End: 2016-07-18
  Administered 2016-07-18: 225 mL via INTRA_ARTERIAL

## 2016-07-18 MED ORDER — SODIUM CHLORIDE 0.9 % WEIGHT BASED INFUSION: 1.0000 mL/kg/h | INTRAVENOUS | Status: DC

## 2016-07-18 MED ORDER — SODIUM CHLORIDE 0.9% FLUSH
3.0000 mL | Freq: Two times a day (BID) | INTRAVENOUS | Status: DC
  Administered 2016-07-18 – 2016-07-19 (×2): 3 mL via INTRAVENOUS

## 2016-07-18 MED ORDER — LABETALOL HCL 5 MG/ML IV SOLN: 10.0000 mg | INTRAVENOUS | Status: AC | PRN

## 2016-07-18 MED ORDER — FENTANYL CITRATE (PF) 100 MCG/2ML IJ SOLN
INTRAMUSCULAR | Status: AC
  Filled 2016-07-18: qty 2

## 2016-07-18 MED ORDER — TICAGRELOR 90 MG PO TABS
ORAL_TABLET | ORAL | Status: AC
  Filled 2016-07-18: qty 2

## 2016-07-18 MED ORDER — NITROGLYCERIN 1 MG/10 ML FOR IR/CATH LAB
INTRA_ARTERIAL | Status: DC | PRN
  Administered 2016-07-18: 200 ug via INTRACORONARY

## 2016-07-18 MED ORDER — SODIUM CHLORIDE 0.9% FLUSH: 3.0000 mL | INTRAVENOUS | Status: DC | PRN

## 2016-07-18 MED ORDER — HEPARIN (PORCINE) IN NACL 2-0.9 UNIT/ML-% IJ SOLN
INTRAMUSCULAR | Status: AC
  Filled 2016-07-18: qty 500

## 2016-07-18 MED ORDER — SODIUM CHLORIDE 0.9 % IV SOLN
0.2500 mg/kg/h | INTRAVENOUS | Status: AC
  Filled 2016-07-18: qty 250

## 2016-07-18 MED ORDER — ASPIRIN 81 MG PO CHEW
CHEWABLE_TABLET | ORAL | Status: DC | PRN
  Administered 2016-07-18: 243 mg via ORAL

## 2016-07-18 MED ORDER — ADENOSINE (DIAGNOSTIC) 140MCG/KG/MIN
INTRAVENOUS | Status: DC | PRN
  Administered 2016-07-18: 140 ug/kg/min via INTRAVENOUS

## 2016-07-18 MED ORDER — ASPIRIN 81 MG PO CHEW
CHEWABLE_TABLET | ORAL | Status: AC
  Filled 2016-07-18: qty 3

## 2016-07-18 MED ORDER — ASPIRIN 81 MG PO CHEW
CHEWABLE_TABLET | ORAL | Status: AC
  Filled 2016-07-18: qty 1

## 2016-07-18 MED ORDER — BIVALIRUDIN TRIFLUOROACETATE 250 MG IV SOLR
INTRAVENOUS | Status: AC
  Filled 2016-07-18: qty 250

## 2016-07-18 MED ORDER — ASPIRIN 81 MG PO CHEW
81.0000 mg | CHEWABLE_TABLET | Freq: Every day | ORAL | Status: DC
Start: 2016-07-18 — End: 2016-07-19
  Administered 2016-07-19: 81 mg via ORAL
  Filled 2016-07-18: qty 1

## 2016-07-18 MED ORDER — SODIUM CHLORIDE 0.9% FLUSH: 3.0000 mL | Freq: Two times a day (BID) | INTRAVENOUS | Status: DC

## 2016-07-18 MED ORDER — SODIUM CHLORIDE 0.9 % IV SOLN: 250.0000 mL | INTRAVENOUS | Status: DC | PRN

## 2016-07-18 MED ORDER — SODIUM CHLORIDE 0.9 % IV SOLN
INTRAVENOUS | Status: AC | PRN
  Administered 2016-07-18: 1.75 mg/kg/h via INTRAVENOUS

## 2016-07-18 MED ORDER — ASPIRIN 81 MG PO CHEW
81.0000 mg | CHEWABLE_TABLET | ORAL | Status: AC
  Administered 2016-07-18: 81 mg via ORAL

## 2016-07-18 MED ORDER — HYDRALAZINE HCL 20 MG/ML IJ SOLN: 5.0000 mg | INTRAMUSCULAR | Status: AC | PRN

## 2016-07-18 MED ORDER — TICAGRELOR 90 MG PO TABS
90.0000 mg | ORAL_TABLET | Freq: Two times a day (BID) | ORAL | Status: DC
  Administered 2016-07-18 – 2016-07-19 (×2): 90 mg via ORAL
  Filled 2016-07-18 (×2): qty 1

## 2016-07-18 MED ORDER — NITROGLYCERIN 5 MG/ML IV SOLN
INTRAVENOUS | Status: AC
  Filled 2016-07-18: qty 10

## 2016-07-18 MED ORDER — FENTANYL CITRATE (PF) 100 MCG/2ML IJ SOLN
INTRAMUSCULAR | Status: DC | PRN
  Administered 2016-07-18 (×2): 25 ug via INTRAVENOUS

## 2016-07-18 MED ORDER — SODIUM CHLORIDE 0.9 % WEIGHT BASED INFUSION
3.0000 mL/kg/h | INTRAVENOUS | Status: DC
  Administered 2016-07-18: 3 mL/kg/h via INTRAVENOUS

## 2016-07-18 MED ORDER — SODIUM CHLORIDE 0.9 % WEIGHT BASED INFUSION
1.0000 mL/kg/h | INTRAVENOUS | Status: AC
  Administered 2016-07-18 (×2): 1 mL/kg/h via INTRAVENOUS

## 2016-07-18 MED ORDER — PAROXETINE HCL 20 MG PO TABS
20.0000 mg | ORAL_TABLET | Freq: Every day | ORAL | Status: DC
  Administered 2016-07-18: 20 mg via ORAL
  Filled 2016-07-18 (×2): qty 1

## 2016-07-18 SURGICAL SUPPLY — 17 items
CATH INFINITI 5FR ANG PIGTAIL (CATHETERS) ×2 IMPLANT
CATH INFINITI 5FR JL4 (CATHETERS) ×2 IMPLANT
CATH INFINITI JR4 5F (CATHETERS) ×2 IMPLANT
CATH VISTA GUIDE 6FRXBLAD3.5SH (CATHETERS) ×2 IMPLANT
DEVICE CLOSURE MYNXGRIP 6/7F (Vascular Products) ×2 IMPLANT
DEVICE INFLAT 30 PLUS (MISCELLANEOUS) ×2 IMPLANT
DILATOR VESSEL 38 20CM 5FR (VASCULAR PRODUCTS) ×2 IMPLANT
KIT MANI 3VAL PERCEP (MISCELLANEOUS) ×3 IMPLANT
NDL PERC 18GX7CM (NEEDLE) IMPLANT
NEEDLE PERC 18GX7CM (NEEDLE) ×3 IMPLANT
PACK CARDIAC CATH (CUSTOM PROCEDURE TRAY) ×3 IMPLANT
SHEATH AVANTI 5FR X 11CM (SHEATH) ×2 IMPLANT
SHEATH AVANTI 6FR X 11CM (SHEATH) ×2 IMPLANT
STENT XIENCE ALPINE RX 2.5X15 (Permanent Stent) ×2 IMPLANT
WIRE EMERALD 3MM-J .035X150CM (WIRE) ×2 IMPLANT
WIRE G HI TQ BMW 190 (WIRE) ×2 IMPLANT
WIRE PRESSURE VERRATA (WIRE) ×2 IMPLANT

## 2016-07-18 NOTE — Progress Notes (Signed)
Patient clinically stable post heart cath with stent placement per Dr Clayborn Bigness, denies complaints at this time. Family and friends at bedside. Dr Clayborn Bigness out to speak with patient and family with questions answered. No bleeding nor hematoma at right groin site. Post ekg done per orders. Will cont with angiomax gtt per orders. And d/c as ordered. Post ns infusion for 10hours at 100 ml/hr.

## 2016-07-18 NOTE — Progress Notes (Signed)
Pt remains stable post heart cath, no bleeding nor hematoma at right groin site. Denies complaints at this time post stent placement. angiomax gtt off at this time. Report called to Janett Billow RN with plan reviewed.

## 2016-07-18 NOTE — Discharge Instructions (Signed)
Groin Insertion Instructions-If you lose feeling or develop tingling or pain in your leg or foot after the procedure, please walk around first.  If the discomfort does not improve , contact your physician and proceed to the nearest emergency room.  Loss of feeling in your leg might mean that a blockage has formed in the artery and this can be appropriately treated.  Limit your activity for the next two days after your procedure.  Avoid stooping, bending, heavy lifting or exertion as this may put pressure on the insertion site.  Resume normal activities in 48 hours.  You may shower after 24 hours but avoid excessive warm water and do not scrub the site.  Remove clear dressing in 48 hours.  If you have had a closure device inserted, do not soak in a tub bath or a hot tub for at least one week. ° °No driving for 48 hours after discharge.  After the procedure, check the insertion site occasionally.  If any oozing occurs or there is apparent swelling, firm pressure over the site will prevent a bruise from forming.  You can not hurt anything by pressing directly on the site.  The pressure stops the bleeding by allowing a small clot to form.  If the bleeding continues after the pressure has been applied for more than 15 minutes, call 911 or go to the nearest emergency room.   ° °The x-ray dye causes you to pass a considerate amount of urine.  For this reason, you will be asked to drink plenty of liquids after the procedure to prevent dehydration.  You may resume you regular diet.  Avoid caffeine products.   ° °For pain at the site of your procedure, take non-aspirin medicines such as Tylenol. ° °Medications: A. Hold Metformin for 48 hours if applicable.  B. Continue taking all your present medications at home unless your doctor prescribes any changes. ° °Moderate Conscious Sedation, Adult, Care After °These instructions provide you with information about caring for yourself after your procedure. Your health care provider  may also give you more specific instructions. Your treatment has been planned according to current medical practices, but problems sometimes occur. Call your health care provider if you have any problems or questions after your procedure. °What can I expect after the procedure? °After your procedure, it is common: °· To feel sleepy for several hours. °· To feel clumsy and have poor balance for several hours. °· To have poor judgment for several hours. °· To vomit if you eat too soon. °Follow these instructions at home: °For at least 24 hours after the procedure:  ° °· Do not: °¨ Participate in activities where you could fall or become injured. °¨ Drive. °¨ Use heavy machinery. °¨ Drink alcohol. °¨ Take sleeping pills or medicines that cause drowsiness. °¨ Make important decisions or sign legal documents. °¨ Take care of children on your own. °· Rest. °Eating and drinking  °· Follow the diet recommended by your health care provider. °· If you vomit: °¨ Drink water, juice, or soup when you can drink without vomiting. °¨ Make sure you have little or no nausea before eating solid foods. °General instructions  °· Have a responsible adult stay with you until you are awake and alert. °· Take over-the-counter and prescription medicines only as told by your health care provider. °· If you smoke, do not smoke without supervision. °· Keep all follow-up visits as told by your health care provider. This is important. °Contact a health   care provider if: °· You keep feeling nauseous or you keep vomiting. °· You feel light-headed. °· You develop a rash. °· You have a fever. °Get help right away if: °· You have trouble breathing. °This information is not intended to replace advice given to you by your health care provider. Make sure you discuss any questions you have with your health care provider. °Document Released: 11/26/2012 Document Revised: 07/11/2015 Document Reviewed: 05/28/2015 °Elsevier Interactive Patient Education © 2017  Elsevier Inc. ° °

## 2016-07-19 ENCOUNTER — Encounter: Payer: Self-pay | Admitting: Internal Medicine

## 2016-07-19 DIAGNOSIS — E669 Obesity, unspecified: Secondary | ICD-10-CM | POA: Diagnosis not present

## 2016-07-19 DIAGNOSIS — I10 Essential (primary) hypertension: Secondary | ICD-10-CM | POA: Diagnosis not present

## 2016-07-19 DIAGNOSIS — K760 Fatty (change of) liver, not elsewhere classified: Secondary | ICD-10-CM | POA: Diagnosis not present

## 2016-07-19 DIAGNOSIS — R0602 Shortness of breath: Secondary | ICD-10-CM | POA: Diagnosis not present

## 2016-07-19 DIAGNOSIS — Z955 Presence of coronary angioplasty implant and graft: Secondary | ICD-10-CM

## 2016-07-19 DIAGNOSIS — Z90710 Acquired absence of both cervix and uterus: Secondary | ICD-10-CM | POA: Diagnosis not present

## 2016-07-19 DIAGNOSIS — E538 Deficiency of other specified B group vitamins: Secondary | ICD-10-CM | POA: Diagnosis not present

## 2016-07-19 DIAGNOSIS — H409 Unspecified glaucoma: Secondary | ICD-10-CM | POA: Diagnosis not present

## 2016-07-19 DIAGNOSIS — I25118 Atherosclerotic heart disease of native coronary artery with other forms of angina pectoris: Secondary | ICD-10-CM | POA: Diagnosis not present

## 2016-07-19 DIAGNOSIS — E7800 Pure hypercholesterolemia, unspecified: Secondary | ICD-10-CM | POA: Diagnosis not present

## 2016-07-19 MED ORDER — TICAGRELOR 90 MG PO TABS: 90.0000 mg | ORAL_TABLET | Freq: Two times a day (BID) | ORAL | 12 refills | Status: DC

## 2016-07-19 NOTE — Final Progress Note (Signed)
Physician Final Progress Note  Patient ID: ELASIA FURNISH MRN: [id number] DOB/AGE: 10/25/1940 76 y.o.  Admit date: 07/18/2016 Admitting provider: Yolonda Kida, MD Discharge date: 07/19/2016   Admission Diagnoses:USA ,positive functional study,CAD  Discharge Diagnoses:  Active Problems:   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  LAD primary  Consults: None  Significant Findings/ Diagnostic Studies: angiography: cardiac cath  Procedures: cardiac cath PCI stent DES LAD  Discharge Condition: stable  Disposition: Final discharge disposition not confirmed  Diet: Low fat, Low cholesterol diet  Discharge Activity: No heavy lifting for 2 weeks  Discharge Instructions    AMB Referral to Cardiac Rehabilitation - Phase II    Complete by:  As directed    Diagnosis:  Coronary Stents     Allergies as of 07/19/2016      Reactions   Metoprolol    Hypotension    Other Diarrhea   All mycin antibiotics    Red Yeast Rice [cholestin]    GI discomfort    Statins    Myalgia, cramps       Medication List    STOP taking these medications   acetaminophen 500 MG tablet Commonly known as:  TYLENOL     TAKE these medications   ALKA-SELTZER HEARTBURN + GAS 750-80 MG Chew Generic drug:  Calcium Carbonate-Simethicone Chew 1 tablet by mouth daily as needed (heartburn).   aspirin EC 81 MG tablet Take 81 mg by mouth at bedtime.   cholecalciferol 400 units Tabs tablet Commonly known as:  VITAMIN D Take 400 Units by mouth at bedtime.   ferrous sulfate 325 (65 FE) MG tablet Take 325 mg by mouth at bedtime.   fexofenadine 180 MG tablet Commonly known as:  ALLEGRA Take 180 mg by mouth at bedtime as needed for allergies or rhinitis.   HYDROcodone-acetaminophen 5-325 MG tablet Commonly known as:  NORCO/VICODIN Take 1 tablet by mouth daily as needed for moderate pain.   nitroGLYCERIN 0.4 MG SL tablet Commonly known as:  NITROSTAT Place 0.4 mg under the tongue  every 5 (five) minutes as needed for chest pain.   PARoxetine 20 MG tablet Commonly known as:  PAXIL Take 20 mg by mouth at bedtime.   ticagrelor 90 MG Tabs tablet Commonly known as:  BRILINTA Take 1 tablet (90 mg total) by mouth 2 (two) times daily.   TIGER BALM PAIN RELIEVING 80-24-16 MG Ptch Generic drug:  Camphor-Menthol-Capsicum Apply 1-3 patches topically daily as needed (pain).   trolamine salicylate 10 % cream Commonly known as:  ASPERCREME Apply 1 application topically as needed for muscle pain.   vitamin B-12 1000 MCG tablet Commonly known as:  CYANOCOBALAMIN Take 1,000 mcg by mouth at bedtime.   Vitamin D (Ergocalciferol) 50000 units Caps capsule Commonly known as:  DRISDOL Take 50,000 Units by mouth every Thursday.        Total time spent taking care of this patient: 30 minutes  Signed: Loran Senters Alissandra Geoffroy 07/19/2016, 9:34 AM

## 2016-07-19 NOTE — Care Management (Signed)
Elective cardiac cath resulting in PCI. Patient to discharge on Brilinta.  Confirmed pharmacy coverage with insurance.  Provided with coupon

## 2016-07-19 NOTE — Care Management Obs Status (Signed)
[place] NOTIFICATION   Patient Details  Name: Linda Yang MRN: [id number] Date of Birth: 1940/08/30   Medicare Observation Status Notification Given:  Yes Outpatient in bed procedure without complications.     Katrina Stack, RN 07/19/2016, 9:38 AM

## 2016-07-26 DIAGNOSIS — I25119 Atherosclerotic heart disease of native coronary artery with unspecified angina pectoris: Secondary | ICD-10-CM | POA: Diagnosis not present

## 2016-07-26 DIAGNOSIS — I2510 Atherosclerotic heart disease of native coronary artery without angina pectoris: Secondary | ICD-10-CM | POA: Insufficient documentation

## 2016-07-26 DIAGNOSIS — E782 Mixed hyperlipidemia: Secondary | ICD-10-CM | POA: Diagnosis not present

## 2016-07-27 ENCOUNTER — Emergency Department
Admission: EM | Admit: 2016-07-27 | Discharge: 2016-07-27 | Disposition: A | Discharge status: Home / Self Care | Payer: Medicare HMO | Attending: Emergency Medicine | Admitting: Emergency Medicine

## 2016-07-27 ENCOUNTER — Encounter: Payer: Self-pay | Admitting: Emergency Medicine

## 2016-07-27 DIAGNOSIS — I2510 Atherosclerotic heart disease of native coronary artery without angina pectoris: Secondary | ICD-10-CM | POA: Insufficient documentation

## 2016-07-27 DIAGNOSIS — I97610 Postprocedural hemorrhage and hematoma of a circulatory system organ or structure following a cardiac catheterization: Secondary | ICD-10-CM | POA: Diagnosis not present

## 2016-07-27 DIAGNOSIS — Z8673 Personal history of transient ischemic attack (TIA), and cerebral infarction without residual deficits: Secondary | ICD-10-CM | POA: Insufficient documentation

## 2016-07-27 DIAGNOSIS — Y929 Unspecified place or not applicable: Secondary | ICD-10-CM | POA: Diagnosis not present

## 2016-07-27 DIAGNOSIS — Y9389 Activity, other specified: Secondary | ICD-10-CM | POA: Insufficient documentation

## 2016-07-27 DIAGNOSIS — L7682 Other postprocedural complications of skin and subcutaneous tissue: Secondary | ICD-10-CM | POA: Diagnosis not present

## 2016-07-27 DIAGNOSIS — X500XXA Overexertion from strenuous movement or load, initial encounter: Secondary | ICD-10-CM | POA: Diagnosis not present

## 2016-07-27 DIAGNOSIS — I10 Essential (primary) hypertension: Secondary | ICD-10-CM | POA: Insufficient documentation

## 2016-07-27 DIAGNOSIS — Y998 Other external cause status: Secondary | ICD-10-CM | POA: Diagnosis not present

## 2016-07-27 DIAGNOSIS — S71131A Puncture wound without foreign body, right thigh, initial encounter: Secondary | ICD-10-CM | POA: Diagnosis not present

## 2016-07-27 HISTORY — DX: Atherosclerotic heart disease of native coronary artery without angina pectoris: I25.10

## 2016-07-27 HISTORY — DX: Pure hypercholesterolemia, unspecified: E78.00

## 2016-07-27 MED ORDER — LIDOCAINE HCL (PF) 1 % IJ SOLN
INTRAMUSCULAR | Status: AC
  Filled 2016-07-27: qty 5

## 2016-07-27 NOTE — ED Notes (Signed)
Pt ambulated well, no bleeding noted to femoral site. MD made aware

## 2016-07-27 NOTE — ED Provider Notes (Signed)
[facility] Emergency Department Provider Note       Time seen: ----------------------------------------- 6:22 PM on 07/27/2016 -----------------------------------------     I have reviewed the triage vital signs and the nursing notes.   HISTORY   Chief Complaint Post-op Problem    HPI Linda Yang is a 76 y.o. female who presents to the ED for bleeding from a recent cardiac catheterization site in her right femoral area. Patient reports she was lifting something heavy prior to when the bleeding started. She denies any issues post catheterization prior to today. Catheterization was performed last Wednesday which was 9 days ago. She denies any pain, fevers, chills, purulent drainage from the wound.   Past Medical History:  Diagnosis Date  . Coronary artery disease   . High cholesterol     Patient Active Problem List   Diagnosis Date Noted  . 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 07/19/2016  . CVA 07/08/2009  . GOUT, UNSPECIFIED 07/06/2009  . HYPERTENSION 07/06/2009  . PALPITATIONS 07/06/2009    Past Surgical History:  Procedure Laterality Date  . CORONARY STENT INTERVENTION N/A 07/18/2016   Procedure: Coronary Stent Intervention;  Surgeon: Yolonda Kida, MD;  Location: [place] [facility];  Service: Cardiovascular;  Laterality: N/A;  . INTRAVASCULAR PRESSURE WIRE/FFR STUDY N/A 07/18/2016   Procedure: Intravascular Pressure Wire/FFR Study;  Surgeon: Yolonda Kida, MD;  Location: [place] [facility];  Service: Cardiovascular;  Laterality: N/A;  . LEFT HEART CATH AND CORONARY ANGIOGRAPHY N/A 07/18/2016   Procedure: Left Heart Cath and Coronary Angiography;  Surgeon: Yolonda Kida, MD;  Location: [place] [facility];  Service: Cardiovascular;  Laterality: N/A;    Allergies Metoprolol; Other; Red yeast rice [cholestin]; and Statins  Social History Social History  Substance Use Topics  . Smoking status:  Never Smoker  . Smokeless tobacco: Never Used  . Alcohol use No    Review of Systems Constitutional: Negative for fever. Musculoskeletal: Negative for back pain. Skin: Positive for bleeding from the right groin Neurological: Negative for headaches, focal weakness or numbness.  All systems negative/normal/unremarkable except as stated in the HPI  ____________________________________________   PHYSICAL EXAM:  VITAL SIGNS: ED Triage Vitals  Enc Vitals Group     BP 07/27/16 1738 131/70     Pulse Rate 07/27/16 1738 63     Resp 07/27/16 1738 18     Temp 07/27/16 1738 97.9 F (36.6 C)     Temp Source 07/27/16 1738 Oral     SpO2 07/27/16 1738 96 %     Weight 07/27/16 1739 204 lb (92.5 kg)     Height 07/27/16 1739 5\' 2"  (1.575 m)     Head Circumference --      Peak Flow --      Pain Score 07/27/16 1738 0     Pain Loc --      Pain Edu? --      Excl. in [place]? --    Constitutional: Alert and oriented. Well appearing and in no distress. Musculoskeletal: Nontender with normal range of motion in extremities. No lower extremity tenderness nor edema.No tenderness around the puncture site Neurologic:  Normal speech and language. No gross focal neurologic deficits are appreciated.  Skin: Persistent venous bleeding from the right femoral access site Psychiatric: Mood and affect are normal. Speech and behavior are normal.  ____________________________________________  ED COURSE:  Pertinent labs & imaging results that were available during my care of the patient were  reviewed by me and considered in my medical decision making (see chart for details). Patient presents for bleeding from her right cardiac catheterization site, she will require a figure 8 stitch in the wound.   Marland Kitchen.Laceration Repair Date/Time: 07/27/2016 6:28 PM Performed by: Earleen Newport Authorized by: Lenise Arena E   Consent:    Consent obtained:  Verbal   Consent given by:  Patient Anesthesia (see MAR for  exact dosages):    Anesthesia method:  Local infiltration   Local anesthetic:  Lidocaine 1% w/o epi Laceration details:    Location:  Leg   Leg location:  R upper leg   Length (cm):  0.2 Repair type:    Repair type:  Simple Pre-procedure details:    Preparation:  Patient was prepped and draped in usual sterile fashion Exploration:    Hemostasis achieved with:  Direct pressure Treatment:    Area cleansed with:  Betadine Skin repair:    Repair method:  Sutures   Suture size:  4-0   Suture material:  Chromic gut   Suture technique:  Figure eight   Number of sutures:  1 Approximation:    Approximation:  Close   Vermilion border: well-aligned   Post-procedure details:    Dressing:  Adhesive bandage   ____________________________________________  FINAL ASSESSMENT AND PLAN  Bleeding from puncture wound  Plan: Patient had presented for bleeding from a recent puncture site for heart catheterization. This was nonpulsatile bleeding which was easily controlled with a figure 8 stitch. The femoral artery is nontender to palpation, I do not think an aneurysm has formed. She is stable for outpatient follow-up.   Earleen Newport, MD   Note: This note was generated in part or whole with voice recognition software. Voice recognition is usually quite accurate but there are transcription errors that can and very often do occur. I apologize for any typographical errors that were not detected and corrected.     Earleen Newport, MD 07/27/16 [phone]

## 2016-07-27 NOTE — ED Triage Notes (Signed)
Patient presents to the ED with bleeding from cardiac catheterization site to her right femoral area.  Patient reports she lifted something heavy prior to the bleeding starting.  Patient denies any issues post cath prior to today.  Cardiac catheterization was performed on last Wednesday (9 days ago).

## 2016-08-02 DIAGNOSIS — Z8249 Family history of ischemic heart disease and other diseases of the circulatory system: Secondary | ICD-10-CM | POA: Diagnosis not present

## 2016-08-02 DIAGNOSIS — E669 Obesity, unspecified: Secondary | ICD-10-CM | POA: Diagnosis not present

## 2016-08-02 DIAGNOSIS — R0602 Shortness of breath: Secondary | ICD-10-CM | POA: Diagnosis not present

## 2016-08-02 DIAGNOSIS — I2510 Atherosclerotic heart disease of native coronary artery without angina pectoris: Secondary | ICD-10-CM | POA: Diagnosis not present

## 2016-08-02 DIAGNOSIS — Z8659 Personal history of other mental and behavioral disorders: Secondary | ICD-10-CM | POA: Diagnosis not present

## 2016-08-02 DIAGNOSIS — I10 Essential (primary) hypertension: Secondary | ICD-10-CM | POA: Diagnosis not present

## 2016-08-02 DIAGNOSIS — Z955 Presence of coronary angioplasty implant and graft: Secondary | ICD-10-CM | POA: Diagnosis not present

## 2016-08-02 DIAGNOSIS — I208 Other forms of angina pectoris: Secondary | ICD-10-CM | POA: Diagnosis not present

## 2016-08-02 DIAGNOSIS — I9789 Other postprocedural complications and disorders of the circulatory system, not elsewhere classified: Secondary | ICD-10-CM | POA: Diagnosis not present

## 2016-08-09 ENCOUNTER — Ambulatory Visit: Payer: Medicare HMO

## 2016-08-23 DIAGNOSIS — R222 Localized swelling, mass and lump, trunk: Secondary | ICD-10-CM | POA: Diagnosis not present

## 2016-08-30 ENCOUNTER — Encounter: Payer: Self-pay | Admitting: *Deleted

## 2016-08-30 ENCOUNTER — Encounter: Payer: Medicare HMO | Attending: Internal Medicine | Admitting: *Deleted

## 2016-08-30 VITALS — Ht 63.5 in | Wt 203.8 lb

## 2016-08-30 DIAGNOSIS — Z955 Presence of coronary angioplasty implant and graft: Secondary | ICD-10-CM | POA: Diagnosis not present

## 2016-08-30 NOTE — Progress Notes (Signed)
Daily Session Note  Patient Details  Name: Linda Yang MRN: [id number] Date of Birth: 08-Sep-1940 Referring Provider:     Cardiac Rehab from 08/30/2016 in [facility] Cardiac and Pulmonary Rehab  Referring Provider  [facility]      Encounter Date: 08/30/2016  Check In:     Session Check In - 08/30/16 1406      Check-In   Location [facility]-Cardiac & Pulmonary Rehab   Staff Present Heath Lark, RN, BSN, Lance Sell, BA, ACSM CEP, Exercise Physiologist   Supervising physician immediately available to respond to emergencies See telemetry face sheet for immediately available ER MD   Medication changes reported     No   Fall or balance concerns reported    No   Warm-up and Cool-down Performed as group-led instruction   Resistance Training Performed Yes   VAD Patient? No     Pain Assessment   Currently in Pain? No/denies           Exercise Prescription Changes - 08/30/16 1400      Response to Exercise   Blood Pressure (Admit) 124/90   Blood Pressure (Exercise) 142/80   Blood Pressure (Exit) 128/88   Heart Rate (Admit) 61 bpm   Heart Rate (Exercise) 86 bpm   Heart Rate (Exit) 65 bpm   Perceived Dyspnea (Exercise) 17      History  Smoking Status  . Never Smoker  Smokeless Tobacco  . Never Used    Goals Met:  Exercise tolerated well Personal goals reviewed No report of cardiac concerns or symptoms Strength training completed today  Goals Unmet:  Not Applicable  Comments: medica review completed   Dr. Emily Filbert is Medical Director for [place] and [facility].

## 2016-08-30 NOTE — Patient Instructions (Signed)
Patient Instructions  Patient Details  Name: Linda Yang MRN: [id number] Date of Birth: [birth year] Referring Provider:  Yolonda Kida, MD  Below are the personal goals you chose as well as exercise and nutrition goals. Our goal is to help you keep on track towards obtaining and maintaining your goals. We will be discussing your progress on these goals with you throughout the program.  Initial Exercise Prescription:     Initial Exercise Prescription - 08/30/16 1400      Date of Initial Exercise RX and Referring Provider   Date 08/30/16   Referring Provider [facility]     Treadmill   MPH 1.7   Grade 0   Minutes 15   METs 2.3     Recumbant Bike   Level 1   RPM 60   Watts 10   Minutes 15   METs 2.3     NuStep   Level 2   SPM 80   Minutes 15   METs 2.3     Prescription Details   Frequency (times per week) 3   Duration Progress to 45 minutes of aerobic exercise without signs/symptoms of physical distress     Intensity   THRR 40-80% of Max Heartrate 94-128   Ratings of Perceived Exertion 11-13   Perceived Dyspnea 0-4     Resistance Training   Training Prescription Yes   Weight 2   Reps 10-15      Exercise Goals: Frequency: Be able to perform aerobic exercise three times per week working toward 3-5 days per week.  Intensity: Work with a perceived exertion of 11 (fairly light) - 15 (hard) as tolerated. Follow your new exercise prescription and watch for changes in prescription as you progress with the program. Changes will be reviewed with you when they are made.  Duration: You should be able to do 30 minutes of continuous aerobic exercise in addition to a 5 minute warm-up and a 5 minute cool-down routine.  Nutrition Goals: Your personal nutrition goals will be established when you do your nutrition analysis with the dietician.  The following are nutrition guidelines to follow: Cholesterol < 200mg /day Sodium < 1500mg /day Fiber: Women over 50 yrs - 21 grams  per day  Personal Goals:     Personal Goals and Risk Factors at Admission - 08/30/16 1429      Core Components/Risk Factors/Patient Goals on Admission   Hypertension Yes   Intervention Provide education on lifestyle modifcations including regular physical activity/exercise, weight management, moderate sodium restriction and increased consumption of fresh fruit, vegetables, and low fat dairy, alcohol moderation, and smoking cessation.;Monitor prescription use compliance.   Expected Outcomes Short Term: Continued assessment and intervention until BP is < 140/60mm HG in hypertensive participants. < 130/37mm HG in hypertensive participants with diabetes, heart failure or chronic kidney disease.;Long Term: Maintenance of blood pressure at goal levels.   Lipids Yes   Intervention Provide education and support for participant on nutrition & aerobic/resistive exercise along with prescribed medications to achieve LDL 70mg , HDL >40mg .   Expected Outcomes Short Term: Participant states understanding of desired cholesterol values and is compliant with medications prescribed. Participant is following exercise prescription and nutrition guidelines.;Long Term: Cholesterol controlled with medications as prescribed, with individualized exercise RX and with personalized nutrition plan. Value goals: LDL < 70mg , HDL > 40 mg.   Stress Yes   Intervention Offer individual and/or small group education and counseling on adjustment to heart disease, stress management and health-related lifestyle change. Teach and support self-help  strategies.;Refer participants experiencing significant psychosocial distress to appropriate mental health specialists for further evaluation and treatment. When possible, include family members and significant others in education/counseling sessions.   Expected Outcomes Short Term: Participant demonstrates changes in health-related behavior, relaxation and other stress management skills, ability  to obtain effective social support, and compliance with psychotropic medications if prescribed.;Long Term: Emotional wellbeing is indicated by absence of clinically significant psychosocial distress or social isolation.      Tobacco Use Initial Evaluation: History  Smoking Status  . Never Smoker  Smokeless Tobacco  . Never Used    Copy of goals given to participant.

## 2016-09-03 NOTE — Progress Notes (Signed)
Cardiac Individual Treatment Plan  Patient Details  Name: Linda Yang MRN: [id number] Date of Birth: [birth year] Referring Provider:     Cardiac Rehab from 08/30/2016 in [facility] Card[facility]monary Rehab  Referring Provider  [facility]      Initial Encounter Date:    Cardiac Rehab from 08/30/2016 in [facility] Cardiac and Pulmonary Rehab  Date  08/30/16  Referring Provider  [facility]      Visit Diagnosis: Status post coronary artery stent placement  Patient's Home Medications on Admission:  Current Outpatient Prescriptions:  .  aspirin EC 81 MG tablet, Take 81 mg by mouth at bedtime., Disp: , Rfl:  .  atorvastatin (LIPITOR) 40 MG tablet, Take by mouth., Disp: , Rfl:  .  Calcium Carbonate-Simethicone (ALKA-SELTZER HEARTBURN + GAS) 750-80 MG CHEW, Chew 1 tablet by mouth daily as needed (heartburn)., Disp: , Rfl:  .  Camphor-Menthol-Capsicum (TIGER BALM PAIN RELIEVING) 80-24-16 MG PTCH, Apply 1-3 patches topically daily as needed (pain)., Disp: , Rfl:  .  cholecalciferol (VITAMIN D) 400 units TABS tablet, Take 400 Units by mouth at bedtime., Disp: , Rfl:  .  ferrous sulfate 325 (65 FE) MG tablet, Take 325 mg by mouth at bedtime., Disp: , Rfl:  .  fexofenadine (ALLEGRA) 180 MG tablet, Take 180 mg by mouth at bedtime as needed for allergies or rhinitis., Disp: , Rfl:  .  HYDROcodone-acetaminophen (NORCO/VICODIN) 5-325 MG tablet, Take 1 tablet by mouth daily as needed for moderate pain., Disp: , Rfl:  .  metoprolol succinate (TOPROL-XL) 25 MG 24 hr tablet, Take by mouth., Disp: , Rfl:  .  nitroGLYCERIN (NITROSTAT) 0.4 MG SL tablet, Place 0.4 mg under the tongue every 5 (five) minutes as needed for chest pain. , Disp: , Rfl:  .  PARoxetine (PAXIL) 20 MG tablet, Take 20 mg by mouth at bedtime., Disp: , Rfl:  .  trolamine salicylate (ASPERCREME) 10 % cream, Apply 1 application topically as needed for muscle pain., Disp: , Rfl:  .  vitamin B-12 (CYANOCOBALAMIN) 1000 MCG tablet, Take 1,000 mcg by mouth  at bedtime., Disp: , Rfl:  .  Vitamin D, Ergocalciferol, (DRISDOL) 50000 units CAPS capsule, Take 50,000 Units by mouth every Thursday., Disp: , Rfl:  .  ticagrelor (BRILINTA) 90 MG TABS tablet, Take 1 tablet (90 mg total) by mouth 2 (two) times daily., Disp: 60 tablet, Rfl: 12  Past Medical History: Past Medical History:  Diagnosis Date  . Coronary artery disease   . High cholesterol     Tobacco Use: History  Smoking Status  . Never Smoker  Smokeless Tobacco  . Never Used    Labs: Recent Review Flowsheet Data    Labs for ITP Cardiac and Pulmonary Rehab Latest Ref Rng & Units 08/05/2009   Cholestrol 0 - 200 mg/dL 236(H)   LDLDIRECT mg/dL 142.2   HDL >39.00 mg/dL 45.90   Trlycerides 0.0 - 149.0 mg/dL 214.0(H)       Exercise Target Goals: Date: 08/30/16  Exercise Program Goal: Individual exercise prescription set with THRR, safety & activity barriers. Participant demonstrates ability to understand and report RPE using BORG scale, to self-measure pulse accurately, and to acknowledge the importance of the exercise prescription.  Exercise Prescription Goal: Starting with aerobic activity 30 plus minutes a day, 3 days per week for initial exercise prescription. Provide home exercise prescription and guidelines that participant acknowledges understanding prior to discharge.  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 Has meds to use for flareups   Cardiac Risk Stratification High      6 Minute Walk:     6 Minute Walk    Row Name 08/30/16 1424 08/30/16 1532       6 Minute Walk   Phase Initial  -    Distance 1430 feet 1430 feet    Walk Time 6 minutes 6 minutes    # of Rest Breaks 0  -    MPH 2.7  -    METS 2.28  -    RPE 17 17     VO2 Peak 7.97  -    Symptoms Yes (comment) Yes (comment)    Comments sciatic pain 10 sciatic pain 10    Resting HR 61 bpm 61 bpm    Resting BP 124/90 124/90    Max Ex. HR 84 bpm 84 bpm    Max Ex. BP 142/80 142/80    2 Minute Post BP 128/88  -       Oxygen Initial Assessment:   Oxygen Re-Evaluation:   Oxygen Discharge (Final Oxygen Re-Evaluation):   Initial Exercise Prescription:     Initial Exercise Prescription - 08/30/16 1400      Date of Initial Exercise RX and Referring Provider   Date 08/30/16   Referring Provider [facility]     Treadmill   MPH 1.7   Grade 0   Minutes 15   METs 2.3     Recumbant Bike   Level 1   RPM 60   Watts 10   Minutes 15   METs 2.3     NuStep   Level 2   SPM 80   Minutes 15   METs 2.3     Prescription Details   Frequency (times per week) 3   Duration Progress to 45 minutes of aerobic exercise without signs/symptoms of physical distress     Intensity   THRR 40-80% of Max Heartrate 94-128   Ratings of Perceived Exertion 11-13   Perceived Dyspnea 0-4     Resistance Training   Training Prescription Yes   Weight 2   Reps 10-15      Perform Capillary Blood Glucose checks as needed.  Exercise Prescription Changes:     Exercise Prescription Changes    Row Name 08/30/16 1400             Response to Exercise   Blood Pressure (Admit) 124/90       Blood Pressure (Exercise) 142/80       Blood Pressure (Exit) 128/88       Heart Rate (Admit) 61 bpm       Heart Rate (Exercise) 86 bpm       Heart Rate (Exit) 65 bpm       Perceived Dyspnea (Exercise) 17          Exercise Comments:   Exercise Goals and Review:     Exercise Goals    Row Name 08/30/16 1423             Exercise Goals   Increase Physical Activity Yes       Intervention Provide advice, education, support and counseling about physical activity/exercise needs.;Develop an individualized exercise prescription for aerobic and resistive training based  on initial evaluation findings, risk stratification, comorbidities and participant's personal goals.  Expected Outcomes Achievement of increased cardiorespiratory fitness and enhanced flexibility, muscular endurance and strength shown through measurements of functional capacity and personal statement of participant.       Increase Strength and Stamina Yes       Intervention Provide advice, education, support and counseling about physical activity/exercise needs.;Develop an individualized exercise prescription for aerobic and resistive training based on initial evaluation findings, risk stratification, comorbidities and participant's personal goals.       Expected Outcomes Achievement of increased cardiorespiratory fitness and enhanced flexibility, muscular endurance and strength shown through measurements of functional capacity and personal statement of participant.          Exercise Goals Re-Evaluation :   Discharge Exercise Prescription (Final Exercise Prescription Changes):     Exercise Prescription Changes - 08/30/16 1400      Response to Exercise   Blood Pressure (Admit) 124/90   Blood Pressure (Exercise) 142/80   Blood Pressure (Exit) 128/88   Heart Rate (Admit) 61 bpm   Heart Rate (Exercise) 86 bpm   Heart Rate (Exit) 65 bpm   Perceived Dyspnea (Exercise) 17      Nutrition:  Target Goals: Understanding of nutrition guidelines, daily intake of sodium 1500mg , cholesterol 200mg , calories 30% from fat and 7% or less from saturated fats, daily to have 5 or more servings of fruits and vegetables.  Biometrics:     Pre Biometrics - 08/30/16 1422      Pre Biometrics   Height 5' 3.5" (1.613 m)   Weight 203 lb 12.8 oz (92.4 kg)   Waist Circumference 43.5 inches   Hip Circumference 51 inches   Waist to Hip Ratio 0.85 %   BMI (Calculated) 35.6   Single Leg Stand 6.36 seconds       Nutrition Therapy Plan and Nutrition Goals:     Nutrition Therapy & Goals - 08/30/16  1417      Intervention Plan   Intervention Prescribe, educate and counsel regarding individualized specific dietary modifications aiming towards targeted core components such as weight, hypertension, lipid management, diabetes, heart failure and other comorbidities.   Expected Outcomes Short Term Goal: Understand basic principles of dietary content, such as calories, fat, sodium, cholesterol and nutrients.;Short Term Goal: A plan has been developed with personal nutrition goals set during dietitian appointment.;Long Term Goal: Adherence to prescribed nutrition plan.      Nutrition Discharge: Rate Your Plate Scores:     Nutrition Assessments - 08/30/16 1417      MEDFICTS Scores   Pre Score 80      Nutrition Goals Re-Evaluation:   Nutrition Goals Discharge (Final Nutrition Goals Re-Evaluation):   Psychosocial: Target Goals: Acknowledge presence or absence of significant depression and/or stress, maximize coping skills, provide positive support system. Participant is able to verbalize types and ability to use techniques and skills needed for reducing stress and depression.   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 Has been on meds in past.  Stated that she handles it on her own without medication.       Quality of Life Scores:      Quality of Life - 08/30/16 1424      Quality of Life Scores   Health/Function Pre 23.57 %   Socioeconomic Pre 17.79 %   Psych/Spiritual Pre 23.14 %  Family Pre 27 %   GLOBAL Pre 22.58 %      PHQ-9: Recent Review Flowsheet Data    Depression screen [facility] 2/9 08/30/2016   Decreased Interest 0   Down, Depressed, Hopeless 0   PHQ - 2 Score 0   Altered sleeping 1   Tired, decreased energy 1   Change in appetite 2   Feeling bad or failure about yourself  0   Trouble concentrating 2   Moving  slowly or fidgety/restless 2   Suicidal thoughts 0   PHQ-9 Score 8   Difficult doing work/chores Somewhat difficult     Interpretation of Total Score  Total Score Depression Severity:  1-4 = Minimal depression, 5-9 = Mild depression, 10-14 = Moderate depression, 15-19 = Moderately severe depression, 20-27 = Severe depression   Psychosocial Evaluation and Intervention:   Psychosocial Re-Evaluation:   Psychosocial Discharge (Final Psychosocial Re-Evaluation):   Vocational Rehabilitation: Provide vocational rehab assistance to qualifying candidates.   Vocational Rehab Evaluation & Intervention:     Vocational Rehab - 08/30/16 1426      Initial Vocational Rehab Evaluation & Intervention   Assessment shows need for Vocational Rehabilitation No      Education: Education Goals: Education classes will be provided on a weekly basis, covering required topics. Participant will state understanding/return demonstration of topics presented.  Learning Barriers/Preferences:     Learning Barriers/Preferences - 08/30/16 1426      Learning Barriers/Preferences   Learning Barriers Hearing   Learning Preferences Individual Instruction      Education Topics: General Nutrition Guidelines/Fats and Fiber: -Group instruction provided by verbal, written material, models and posters to present the general guidelines for heart healthy nutrition. Gives an explanation and review of dietary fats and fiber.   Controlling Sodium/Reading Food Labels: -Group verbal and written material supporting the discussion of sodium use in heart healthy nutrition. Review and explanation with models, verbal and written materials for utilization of the food label.   Exercise Physiology & Risk Factors: - Group verbal and written instruction with models to review the exercise physiology of the cardiovascular system and associated critical values. Details cardiovascular disease risk factors and the goals  associated with each risk factor.   Aerobic Exercise & Resistance Training: - Gives group verbal and written discussion on the health impact of inactivity. On the components of aerobic and resistive training programs and the benefits of this training and how to safely progress through these programs.   Flexibility, Balance, General Exercise Guidelines: - Provides group verbal and written instruction on the benefits of flexibility and balance training programs. Provides general exercise guidelines with specific guidelines to those with heart or lung disease. Demonstration and skill practice provided.   Stress Management: - Provides group verbal and written instruction about the health risks of elevated stress, cause of high stress, and healthy ways to reduce stress.   Depression: - Provides group verbal and written instruction on the correlation between heart/lung disease and depressed mood, treatment options, and the stigmas associated with seeking treatment.   Anatomy & Physiology of the Heart: - Group verbal and written instruction and models provide basic cardiac anatomy and physiology, with the coronary electrical and arterial systems. Review of: AMI, Angina, Valve disease, Heart Failure, Cardiac Arrhythmia, Pacemakers, and the ICD.   Cardiac Procedures: - Group verbal and written instruction and models to describe the testing methods done to diagnose heart disease. Reviews the outcomes of the test results. Describes the treatment choices: Medical Management, Angioplasty, or  Coronary Bypass Surgery.   Cardiac Medications: - Group verbal and written instruction to review commonly prescribed medications for heart disease. Reviews the medication, class of the drug, and side effects. Includes the steps to properly store meds and maintain the prescription regimen.   Go Sex-Intimacy & Heart Disease, Get SMART - Goal Setting: - Group verbal and written instruction through game format to  discuss heart disease and the return to sexual intimacy. Provides group verbal and written material to discuss and apply goal setting through the application of the S.M.A.R.T. Method.   Other Matters of the Heart: - Provides group verbal, written materials and models to describe Heart Failure, Angina, Valve Disease, and Diabetes in the realm of heart disease. Includes description of the disease process and treatment options available to the cardiac patient.   Exercise & Equipment Safety: - Individual verbal instruction and demonstration of equipment use and safety with use of the equipment.   Cardiac Rehab from 08/30/2016 in [facility] Cardiac and Pulmonary Rehab  Date  08/30/16  Educator  SB  Instruction Review Code  2- meets goals/outcomes      Infection Prevention: - Provides verbal and written material to individual with discussion of infection control including proper hand washing and proper equipment cleaning during exercise session.   Cardiac Rehab from 08/30/2016 in [facility] Cardiac and Pulmonary Rehab  Date  08/30/16  Educator  SB  Instruction Review Code  2- meets goals/outcomes      Falls Prevention: - Provides verbal and written material to individual with discussion of falls prevention and safety.   Cardiac Rehab from 08/30/2016 in [facility] Cardiac and Pulmonary Rehab  Date  08/30/16  Educator  Sb  Instruction Review Code  2- meets goals/outcomes      Diabetes: - Individual verbal and written instruction to review signs/symptoms of diabetes, desired ranges of glucose level fasting, after meals and with exercise. Advice that pre and post exercise glucose checks will be done for 3 sessions at entry of program.    Knowledge Questionnaire Score:     Knowledge Questionnaire Score - 08/30/16 1426      Knowledge Questionnaire Score   Pre Score 20/28      Core Components/Risk Factors/Patient Goals at Admission:     Personal Goals and Risk Factors at Admission - 08/30/16 1429       Core Components/Risk Factors/Patient Goals on Admission   Hypertension Yes   Intervention Provide education on lifestyle modifcations including regular physical activity/exercise, weight management, moderate sodium restriction and increased consumption of fresh fruit, vegetables, and low fat dairy, alcohol moderation, and smoking cessation.;Monitor prescription use compliance.   Expected Outcomes Short Term: Continued assessment and intervention until BP is < 140/36mm HG in hypertensive participants. < 130/52mm HG in hypertensive participants with diabetes, heart failure or chronic kidney disease.;Long Term: Maintenance of blood pressure at goal levels.   Lipids Yes   Intervention Provide education and support for participant on nutrition & aerobic/resistive exercise along with prescribed medications to achieve LDL 70mg , HDL >40mg .   Expected Outcomes Short Term: Participant states understanding of desired cholesterol values and is compliant with medications prescribed. Participant is following exercise prescription and nutrition guidelines.;Long Term: Cholesterol controlled with medications as prescribed, with individualized exercise RX and with personalized nutrition plan. Value goals: LDL < 70mg , HDL > 40 mg.   Stress Yes   Intervention Offer individual and/or small group education and counseling on adjustment to heart disease, stress management and health-related lifestyle change. Teach and support self-help  strategies.;Refer participants experiencing significant psychosocial distress to appropriate mental health specialists for further evaluation and treatment. When possible, include family members and significant others in education/counseling sessions.   Expected Outcomes Short Term: Participant demonstrates changes in health-related behavior, relaxation and other stress management skills, ability to obtain effective social support, and compliance with psychotropic medications if  prescribed.;Long Term: Emotional wellbeing is indicated by absence of clinically significant psychosocial distress or social isolation.      Core Components/Risk Factors/Patient Goals Review:    Core Components/Risk Factors/Patient Goals at Discharge (Final Review):    ITP Comments:     ITP Comments    Row Name 08/30/16 1409           ITP Comments Medical review completed Initial ITP created.  Diagnosis documentation can be found [facility] encounter 07/10/2016          Comments:

## 2016-09-05 ENCOUNTER — Encounter: Payer: Self-pay | Admitting: *Deleted

## 2016-09-05 DIAGNOSIS — Z955 Presence of coronary angioplasty implant and graft: Secondary | ICD-10-CM

## 2016-09-05 NOTE — Progress Notes (Signed)
Cardiac Individual Treatment Plan  Patient Details  Name: Linda Yang MRN: [id number] Date of Birth: January 16, 1941 Referring Provider:     Cardiac Rehab from 08/30/2016 in [facility] Cardiac and Pulmonary Rehab  Referring Provider  [facility]      Initial Encounter Date:    Cardiac Rehab from 08/30/2016 in [facility] Cardiac and Pulmonary Rehab  Date  08/30/16  Referring Provider  [facility]      Visit Diagnosis: Status post coronary artery stent placement  Patient's Home Medications on Admission:  Current Outpatient Prescriptions:  .  aspirin EC 81 MG tablet, Take 81 mg by mouth at bedtime., Disp: , Rfl:  .  atorvastatin (LIPITOR) 40 MG tablet, Take by mouth., Disp: , Rfl:  .  Calcium Carbonate-Simethicone (ALKA-SELTZER HEARTBURN + GAS) 750-80 MG CHEW, Chew 1 tablet by mouth daily as needed (heartburn)., Disp: , Rfl:  .  Camphor-Menthol-Capsicum (TIGER BALM PAIN RELIEVING) 80-24-16 MG PTCH, Apply 1-3 patches topically daily as needed (pain)., Disp: , Rfl:  .  cholecalciferol (VITAMIN D) 400 units TABS tablet, Take 400 Units by mouth at bedtime., Disp: , Rfl:  .  ferrous sulfate 325 (65 FE) MG tablet, Take 325 mg by mouth at bedtime., Disp: , Rfl:  .  fexofenadine (ALLEGRA) 180 MG tablet, Take 180 mg by mouth at bedtime as needed for allergies or rhinitis., Disp: , Rfl:  .  HYDROcodone-acetaminophen (NORCO/VICODIN) 5-325 MG tablet, Take 1 tablet by mouth daily as needed for moderate pain., Disp: , Rfl:  .  metoprolol succinate (TOPROL-XL) 25 MG 24 hr tablet, Take by mouth., Disp: , Rfl:  .  nitroGLYCERIN (NITROSTAT) 0.4 MG SL tablet, Place 0.4 mg under the tongue every 5 (five) minutes as needed for chest pain. , Disp: , Rfl:  .  PARoxetine (PAXIL) 20 MG tablet, Take 20 mg by mouth at bedtime., Disp: , Rfl:  .  ticagrelor (BRILINTA) 90 MG TABS tablet, Take 1 tablet (90 mg total) by mouth 2 (two) times daily., Disp: 60 tablet, Rfl: 12 .  trolamine salicylate (ASPERCREME) 10 % cream, Apply 1 application  topically as needed for muscle pain., Disp: , Rfl:  .  vitamin B-12 (CYANOCOBALAMIN) 1000 MCG tablet, Take 1,000 mcg by mouth at bedtime., Disp: , Rfl:  .  Vitamin D, Ergocalciferol, (DRISDOL) 50000 units CAPS capsule, Take 50,000 Units by mouth every Thursday., Disp: , Rfl:   Past Medical History: Past Medical History:  Diagnosis Date  . Coronary artery disease   . High cholesterol     Tobacco Use: History  Smoking Status  . Never Smoker  Smokeless Tobacco  . Never Used    Labs: Recent Review Flowsheet Data    Labs for ITP Cardiac and Pulmonary Rehab Latest Ref Rng & Units 08/05/2009   Cholestrol 0 - 200 mg/dL 236(H)   LDLDIRECT mg/dL 142.2   HDL >39.00 mg/dL 45.90   Trlycerides 0.0 - 149.0 mg/dL 214.0(H)       Exercise Target Goals:    Exercise Program Goal: Individual exercise prescription set with THRR, safety & activity barriers. Participant demonstrates ability to understand and report RPE using BORG scale, to self-measure pulse accurately, and to acknowledge the importance of the exercise prescription.  Exercise Prescription Goal: Starting with aerobic activity 30 plus minutes a day, 3 days per week for initial exercise prescription. Provide home exercise prescription and guidelines that participant acknowledges understanding prior to discharge.  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 Has meds to use for flareups   Cardiac Risk Stratification High      6 Minute Walk:     6 Minute Walk    Row Name 08/30/16 1424 08/30/16 1532       6 Minute Walk   Phase Initial  -    Distance 1430 feet 1430 feet    Walk Time 6 minutes 6 minutes    # of Rest Breaks 0  -    MPH 2.7  -    METS 2.28  -    RPE 17 17    VO2 Peak 7.97   -    Symptoms Yes (comment) Yes (comment)    Comments sciatic pain 10 sciatic pain 10    Resting HR 61 bpm 61 bpm    Resting BP 124/90 124/90    Max Ex. HR 84 bpm 84 bpm    Max Ex. BP 142/80 142/80    2 Minute Post BP 128/88  -       Oxygen Initial Assessment:   Oxygen Re-Evaluation:   Oxygen Discharge (Final Oxygen Re-Evaluation):   Initial Exercise Prescription:     Initial Exercise Prescription - 08/30/16 1400      Date of Initial Exercise RX and Referring Provider   Date 08/30/16   Referring Provider [facility]     Treadmill   MPH 1.7   Grade 0   Minutes 15   METs 2.3     Recumbant Bike   Level 1   RPM 60   Watts 10   Minutes 15   METs 2.3     NuStep   Level 2   SPM 80   Minutes 15   METs 2.3     Prescription Details   Frequency (times per week) 3   Duration Progress to 45 minutes of aerobic exercise without signs/symptoms of physical distress     Intensity   THRR 40-80% of Max Heartrate 94-128   Ratings of Perceived Exertion 11-13   Perceived Dyspnea 0-4     Resistance Training   Training Prescription Yes   Weight 2   Reps 10-15      Perform Capillary Blood Glucose checks as needed.  Exercise Prescription Changes:     Exercise Prescription Changes    Row Name 08/30/16 1400             Response to Exercise   Blood Pressure (Admit) 124/90       Blood Pressure (Exercise) 142/80       Blood Pressure (Exit) 128/88       Heart Rate (Admit) 61 bpm       Heart Rate (Exercise) 86 bpm       Heart Rate (Exit) 65 bpm       Perceived Dyspnea (Exercise) 17          Exercise Comments:   Exercise Goals and Review:     Exercise Goals    Row Name 08/30/16 1423             Exercise Goals   Increase Physical Activity Yes       Intervention Provide advice, education, support and counseling about physical activity/exercise needs.;Develop an individualized exercise prescription for aerobic and resistive training based on initial  evaluation findings, risk stratification, comorbidities and participant's personal goals.  Expected Outcomes Achievement of increased cardiorespiratory fitness and enhanced flexibility, muscular endurance and strength shown through measurements of functional capacity and personal statement of participant.       Increase Strength and Stamina Yes       Intervention Provide advice, education, support and counseling about physical activity/exercise needs.;Develop an individualized exercise prescription for aerobic and resistive training based on initial evaluation findings, risk stratification, comorbidities and participant's personal goals.       Expected Outcomes Achievement of increased cardiorespiratory fitness and enhanced flexibility, muscular endurance and strength shown through measurements of functional capacity and personal statement of participant.          Exercise Goals Re-Evaluation :   Discharge Exercise Prescription (Final Exercise Prescription Changes):     Exercise Prescription Changes - 08/30/16 1400      Response to Exercise   Blood Pressure (Admit) 124/90   Blood Pressure (Exercise) 142/80   Blood Pressure (Exit) 128/88   Heart Rate (Admit) 61 bpm   Heart Rate (Exercise) 86 bpm   Heart Rate (Exit) 65 bpm   Perceived Dyspnea (Exercise) 17      Nutrition:  Target Goals: Understanding of nutrition guidelines, daily intake of sodium 1500mg , cholesterol 200mg , calories 30% from fat and 7% or less from saturated fats, daily to have 5 or more servings of fruits and vegetables.  Biometrics:     Pre Biometrics - 08/30/16 1422      Pre Biometrics   Height 5' 3.5" (1.613 m)   Weight 203 lb 12.8 oz (92.4 kg)   Waist Circumference 43.5 inches   Hip Circumference 51 inches   Waist to Hip Ratio 0.85 %   BMI (Calculated) 35.6   Single Leg Stand 6.36 seconds       Nutrition Therapy Plan and Nutrition Goals:     Nutrition Therapy & Goals - 08/30/16 1417       Intervention Plan   Intervention Prescribe, educate and counsel regarding individualized specific dietary modifications aiming towards targeted core components such as weight, hypertension, lipid management, diabetes, heart failure and other comorbidities.   Expected Outcomes Short Term Goal: Understand basic principles of dietary content, such as calories, fat, sodium, cholesterol and nutrients.;Short Term Goal: A plan has been developed with personal nutrition goals set during dietitian appointment.;Long Term Goal: Adherence to prescribed nutrition plan.      Nutrition Discharge: Rate Your Plate Scores:     Nutrition Assessments - 08/30/16 1417      MEDFICTS Scores   Pre Score 80      Nutrition Goals Re-Evaluation:   Nutrition Goals Discharge (Final Nutrition Goals Re-Evaluation):   Psychosocial: Target Goals: Acknowledge presence or absence of significant depression and/or stress, maximize coping skills, provide positive support system. Participant is able to verbalize types and ability to use techniques and skills needed for reducing stress and depression.   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 Has been on meds in past.  Stated that she handles it on her own without medication.       Quality of Life Scores:      Quality of Life - 08/30/16 1424      Quality of Life Scores   Health/Function Pre 23.57 %   Socioeconomic Pre 17.79 %   Psych/Spiritual Pre 23.14 %  Family Pre 27 %   GLOBAL Pre 22.58 %      PHQ-9: Recent Review Flowsheet Data    Depression screen [facility] 2/9 08/30/2016   Decreased Interest 0   Down, Depressed, Hopeless 0   PHQ - 2 Score 0   Altered sleeping 1   Tired, decreased energy 1   Change in appetite 2   Feeling bad or failure about yourself  0   Trouble concentrating 2   Moving slowly or  fidgety/restless 2   Suicidal thoughts 0   PHQ-9 Score 8   Difficult doing work/chores Somewhat difficult     Interpretation of Total Score  Total Score Depression Severity:  1-4 = Minimal depression, 5-9 = Mild depression, 10-14 = Moderate depression, 15-19 = Moderately severe depression, 20-27 = Severe depression   Psychosocial Evaluation and Intervention:   Psychosocial Re-Evaluation:   Psychosocial Discharge (Final Psychosocial Re-Evaluation):   Vocational Rehabilitation: Provide vocational rehab assistance to qualifying candidates.   Vocational Rehab Evaluation & Intervention:     Vocational Rehab - 08/30/16 1426      Initial Vocational Rehab Evaluation & Intervention   Assessment shows need for Vocational Rehabilitation No      Education: Education Goals: Education classes will be provided on a weekly basis, covering required topics. Participant will state understanding/return demonstration of topics presented.  Learning Barriers/Preferences:     Learning Barriers/Preferences - 08/30/16 1426      Learning Barriers/Preferences   Learning Barriers Hearing   Learning Preferences Individual Instruction      Education Topics: General Nutrition Guidelines/Fats and Fiber: -Group instruction provided by verbal, written material, models and posters to present the general guidelines for heart healthy nutrition. Gives an explanation and review of dietary fats and fiber.   Controlling Sodium/Reading Food Labels: -Group verbal and written material supporting the discussion of sodium use in heart healthy nutrition. Review and explanation with models, verbal and written materials for utilization of the food label.   Exercise Physiology & Risk Factors: - Group verbal and written instruction with models to review the exercise physiology of the cardiovascular system and associated critical values. Details cardiovascular disease risk factors and the goals associated with  each risk factor.   Aerobic Exercise & Resistance Training: - Gives group verbal and written discussion on the health impact of inactivity. On the components of aerobic and resistive training programs and the benefits of this training and how to safely progress through these programs.   Flexibility, Balance, General Exercise Guidelines: - Provides group verbal and written instruction on the benefits of flexibility and balance training programs. Provides general exercise guidelines with specific guidelines to those with heart or lung disease. Demonstration and skill practice provided.   Stress Management: - Provides group verbal and written instruction about the health risks of elevated stress, cause of high stress, and healthy ways to reduce stress.   Depression: - Provides group verbal and written instruction on the correlation between heart/lung disease and depressed mood, treatment options, and the stigmas associated with seeking treatment.   Anatomy & Physiology of the Heart: - Group verbal and written instruction and models provide basic cardiac anatomy and physiology, with the coronary electrical and arterial systems. Review of: AMI, Angina, Valve disease, Heart Failure, Cardiac Arrhythmia, Pacemakers, and the ICD.   Cardiac Procedures: - Group verbal and written instruction and models to describe the testing methods done to diagnose heart disease. Reviews the outcomes of the test results. Describes the treatment choices: Medical Management, Angioplasty, or  Coronary Bypass Surgery.   Cardiac Medications: - Group verbal and written instruction to review commonly prescribed medications for heart disease. Reviews the medication, class of the drug, and side effects. Includes the steps to properly store meds and maintain the prescription regimen.   Go Sex-Intimacy & Heart Disease, Get SMART - Goal Setting: - Group verbal and written instruction through game format to discuss heart  disease and the return to sexual intimacy. Provides group verbal and written material to discuss and apply goal setting through the application of the S.M.A.R.T. Method.   Other Matters of the Heart: - Provides group verbal, written materials and models to describe Heart Failure, Angina, Valve Disease, and Diabetes in the realm of heart disease. Includes description of the disease process and treatment options available to the cardiac patient.   Exercise & Equipment Safety: - Individual verbal instruction and demonstration of equipment use and safety with use of the equipment.   Cardiac Rehab from 08/30/2016 in [facility] Cardiac and Pulmonary Rehab  Date  08/30/16  Educator  SB  Instruction Review Code  2- meets goals/outcomes      Infection Prevention: - Provides verbal and written material to individual with discussion of infection control including proper hand washing and proper equipment cleaning during exercise session.   Cardiac Rehab from 08/30/2016 in [facility] Cardiac and Pulmonary Rehab  Date  08/30/16  Educator  SB  Instruction Review Code  2- meets goals/outcomes      Falls Prevention: - Provides verbal and written material to individual with discussion of falls prevention and safety.   Cardiac Rehab from 08/30/2016 in [facility] Cardiac and Pulmonary Rehab  Date  08/30/16  Educator  Sb  Instruction Review Code  2- meets goals/outcomes      Diabetes: - Individual verbal and written instruction to review signs/symptoms of diabetes, desired ranges of glucose level fasting, after meals and with exercise. Advice that pre and post exercise glucose checks will be done for 3 sessions at entry of program.    Knowledge Questionnaire Score:     Knowledge Questionnaire Score - 08/30/16 1426      Knowledge Questionnaire Score   Pre Score 20/28      Core Components/Risk Factors/Patient Goals at Admission:     Personal Goals and Risk Factors at Admission - 08/30/16 1429      Core  Components/Risk Factors/Patient Goals on Admission   Hypertension Yes   Intervention Provide education on lifestyle modifcations including regular physical activity/exercise, weight management, moderate sodium restriction and increased consumption of fresh fruit, vegetables, and low fat dairy, alcohol moderation, and smoking cessation.;Monitor prescription use compliance.   Expected Outcomes Short Term: Continued assessment and intervention until BP is < 140/51mm HG in hypertensive participants. < 130/62mm HG in hypertensive participants with diabetes, heart failure or chronic kidney disease.;Long Term: Maintenance of blood pressure at goal levels.   Lipids Yes   Intervention Provide education and support for participant on nutrition & aerobic/resistive exercise along with prescribed medications to achieve LDL 70mg , HDL >40mg .   Expected Outcomes Short Term: Participant states understanding of desired cholesterol values and is compliant with medications prescribed. Participant is following exercise prescription and nutrition guidelines.;Long Term: Cholesterol controlled with medications as prescribed, with individualized exercise RX and with personalized nutrition plan. Value goals: LDL < 70mg , HDL > 40 mg.   Stress Yes   Intervention Offer individual and/or small group education and counseling on adjustment to heart disease, stress management and health-related lifestyle change. Teach and support self-help  strategies.;Refer participants experiencing significant psychosocial distress to appropriate mental health specialists for further evaluation and treatment. When possible, include family members and significant others in education/counseling sessions.   Expected Outcomes Short Term: Participant demonstrates changes in health-related behavior, relaxation and other stress management skills, ability to obtain effective social support, and compliance with psychotropic medications if prescribed.;Long Term:  Emotional wellbeing is indicated by absence of clinically significant psychosocial distress or social isolation.      Core Components/Risk Factors/Patient Goals Review:    Core Components/Risk Factors/Patient Goals at Discharge (Final Review):    ITP Comments:     ITP Comments    Row Name 08/30/16 1409 09/05/16 0718         ITP Comments Medical review completed Initial ITP created.  Diagnosis documentation can be found [facility] encounter 07/10/2016 30 day review. Continue with ITP unless directed changes per Medical Director review    New to Program         Comments:

## 2016-09-11 DIAGNOSIS — Z955 Presence of coronary angioplasty implant and graft: Secondary | ICD-10-CM | POA: Diagnosis not present

## 2016-09-11 NOTE — Progress Notes (Signed)
Daily Session Note  Patient Details  Name: Linda Yang MRN: [id number] Date of Birth: 06/03/1940 Referring Provider:     Cardiac Rehab from 08/30/2016 in [facility] Cardiac and Pulmonary Rehab  Referring Provider  [facility]      Encounter Date: 09/11/2016  Check In:     Session Check In - 09/11/16 0914      Check-In   Location [facility]-Cardiac & Pulmonary Rehab   Staff Present Heath Lark, RN, BSN, CCRP;Jessica [place], MA, ACSM RCEP, Exercise Physiologist;Amanda Oletta Darter, [place], ACSM CEP, Exercise Physiologist   Supervising physician immediately available to respond to emergencies See telemetry face sheet for immediately available ER MD   Medication changes reported     No   Fall or balance concerns reported    No   Warm-up and Cool-down Performed on first and last piece of equipment   Resistance Training Performed Yes   VAD Patient? No     Pain Assessment   Currently in Pain? No/denies         History  Smoking Status  . Never Smoker  Smokeless Tobacco  . Never Used    Goals Met:  Independence with exercise equipment Exercise tolerated well No report of cardiac concerns or symptoms Strength training completed today  Goals Unmet:  Not Applicable  Comments: First full day of exercise!  Patient was oriented to gym and equipment including functions, settings, policies, and procedures.  Patient's individual exercise prescription and treatment plan were reviewed.  All starting workloads were established based on the results of the 6 minute walk test done at initial orientation visit.  The plan for exercise progression was also introduced and progression will be customized based on patient's performance and goals.    Dr. Emily Filbert is Medical Director for [place] and [facility].

## 2016-09-11 NOTE — Patient Instructions (Signed)
Patient Instructions  Patient Details  Name: Linda Yang MRN: [id number] Date of Birth: 05-12-1940 Referring Provider:  Yolonda Kida, MD  Below are the personal goals you chose as well as exercise and nutrition goals. Our goal is to help you keep on track towards obtaining and maintaining your goals. We will be discussing your progress on these goals with you throughout the program.  Initial Exercise Prescription:     Initial Exercise Prescription - 08/30/16 1400      Date of Initial Exercise RX and Referring Provider   Date 08/30/16   Referring Provider [facility]     Treadmill   MPH 1.7   Grade 0   Minutes 15   METs 2.3     Recumbant Bike   Level 1   RPM 60   Watts 10   Minutes 15   METs 2.3     NuStep   Level 2   SPM 80   Minutes 15   METs 2.3     Prescription Details   Frequency (times per week) 3   Duration Progress to 45 minutes of aerobic exercise without signs/symptoms of physical distress     Intensity   THRR 40-80% of Max Heartrate 94-128   Ratings of Perceived Exertion 11-13   Perceived Dyspnea 0-4     Resistance Training   Training Prescription Yes   Weight 2   Reps 10-15      Exercise Goals: Frequency: Be able to perform aerobic exercise three times per week working toward 3-5 days per week.  Intensity: Work with a perceived exertion of 11 (fairly light) - 15 (hard) as tolerated. Follow your new exercise prescription and watch for changes in prescription as you progress with the program. Changes will be reviewed with you when they are made.  Duration: You should be able to do 30 minutes of continuous aerobic exercise in addition to a 5 minute warm-up and a 5 minute cool-down routine.  Nutrition Goals: Your personal nutrition goals will be established when you do your nutrition analysis with the dietician.  The following are nutrition guidelines to follow: Cholesterol < 200mg /day Sodium < 1500mg /day Fiber: Women over 50 yrs - 21 grams  per day  Personal Goals:     Personal Goals and Risk Factors at Admission - 08/30/16 1429      Core Components/Risk Factors/Patient Goals on Admission   Hypertension Yes   Intervention Provide education on lifestyle modifcations including regular physical activity/exercise, weight management, moderate sodium restriction and increased consumption of fresh fruit, vegetables, and low fat dairy, alcohol moderation, and smoking cessation.;Monitor prescription use compliance.   Expected Outcomes Short Term: Continued assessment and intervention until BP is < 140/22mm HG in hypertensive participants. < 130/58mm HG in hypertensive participants with diabetes, heart failure or chronic kidney disease.;Long Term: Maintenance of blood pressure at goal levels.   Lipids Yes   Intervention Provide education and support for participant on nutrition & aerobic/resistive exercise along with prescribed medications to achieve LDL 70mg , HDL >40mg .   Expected Outcomes Short Term: Participant states understanding of desired cholesterol values and is compliant with medications prescribed. Participant is following exercise prescription and nutrition guidelines.;Long Term: Cholesterol controlled with medications as prescribed, with individualized exercise RX and with personalized nutrition plan. Value goals: LDL < 70mg , HDL > 40 mg.   Stress Yes   Intervention Offer individual and/or small group education and counseling on adjustment to heart disease, stress management and health-related lifestyle change. Teach and support self-help  strategies.;Refer participants experiencing significant psychosocial distress to appropriate mental health specialists for further evaluation and treatment. When possible, include family members and significant others in education/counseling sessions.   Expected Outcomes Short Term: Participant demonstrates changes in health-related behavior, relaxation and other stress management skills, ability  to obtain effective social support, and compliance with psychotropic medications if prescribed.;Long Term: Emotional wellbeing is indicated by absence of clinically significant psychosocial distress or social isolation.      Tobacco Use Initial Evaluation: History  Smoking Status  . Never Smoker  Smokeless Tobacco  . Never Used    Copy of goals given to participant.

## 2016-09-13 DIAGNOSIS — Z955 Presence of coronary angioplasty implant and graft: Secondary | ICD-10-CM | POA: Diagnosis not present

## 2016-09-14 DIAGNOSIS — H40013 Open angle with borderline findings, low risk, bilateral: Secondary | ICD-10-CM | POA: Diagnosis not present

## 2016-09-19 DIAGNOSIS — H2512 Age-related nuclear cataract, left eye: Secondary | ICD-10-CM | POA: Diagnosis not present

## 2016-09-20 ENCOUNTER — Encounter: Payer: Medicare HMO | Attending: Internal Medicine

## 2016-09-20 DIAGNOSIS — Z955 Presence of coronary angioplasty implant and graft: Secondary | ICD-10-CM | POA: Diagnosis not present

## 2016-09-20 NOTE — Progress Notes (Signed)
Daily Session Note  Patient Details  Name: Linda Yang MRN: [id number] Date of Birth: Sep 30, 1940 Referring Provider:     Cardiac Rehab from 08/30/2016 in [facility] Cardiac and Pulmonary Rehab  Referring Provider  [facility]      Encounter Date: 09/20/2016  Check In:     Session Check In - 09/20/16 0845      Check-In   Location [facility]-Cardiac & Pulmonary Rehab   Staff Present Alberteen Sam, MA, ACSM RCEP, Exercise Physiologist;Amanda Oletta Darter, BA, ACSM CEP, Exercise Physiologist;Meredith Sherryll Burger, RN BSN   Supervising physician immediately available to respond to emergencies See telemetry face sheet for immediately available ER MD   Medication changes reported     No   Fall or balance concerns reported    No   Warm-up and Cool-down Performed on first and last piece of equipment   Resistance Training Performed Yes   VAD Patient? No     Pain Assessment   Currently in Pain? No/denies         History  Smoking Status  . Never Smoker  Smokeless Tobacco  . Never Used    Goals Met:  Independence with exercise equipment Exercise tolerated well No report of cardiac concerns or symptoms Strength training completed today  Goals Unmet:  Not Applicable  Comments: Pt able to follow exercise prescription today without complaint.  Will continue to monitor for progression.    Dr. Emily Filbert is Medical Director for [place] and [facility].

## 2016-09-25 ENCOUNTER — Encounter: Payer: Medicare HMO | Admitting: *Deleted

## 2016-09-25 ENCOUNTER — Encounter: Payer: Self-pay | Admitting: Dietician

## 2016-09-25 DIAGNOSIS — Z955 Presence of coronary angioplasty implant and graft: Secondary | ICD-10-CM | POA: Diagnosis not present

## 2016-09-25 NOTE — Progress Notes (Addendum)
Daily Session Note  Patient Details  Name: Linda Yang MRN: [id number] Date of Birth: June 08, 1940 Referring Provider:     Cardiac Rehab from 08/30/2016 in [facility] Cardiac and Pulmonary Rehab  Referring Provider  [facility]      Encounter Date: 09/25/2016  Check In:     Session Check In - 09/25/16 0817      Check-In   Location [facility]-Cardiac & Pulmonary Rehab   Staff Present Alberteen Sam, MA, ACSM RCEP, Exercise Physiologist;Susanne Bice, RN, BSN, Florestine Avers, RN BSN   Supervising physician immediately available to respond to emergencies See telemetry face sheet for immediately available ER MD   Medication changes reported     No   Fall or balance concerns reported    No   Warm-up and Cool-down Performed on first and last piece of equipment   Resistance Training Performed Yes   VAD Patient? No     Pain Assessment   Currently in Pain? No/denies   Multiple Pain Sites No         History  Smoking Status  . Never Smoker  Smokeless Tobacco  . Never Used    Goals Met:  Independence with exercise equipment Exercise tolerated well No report of cardiac concerns or symptoms Strength training completed today  Goals Unmet:  Not Applicable  Comments: Pt able to follow exercise prescription today without complaint.  Will continue to monitor for progression.  Reviewed home exercise with pt today.  Pt plans to walk and go to gym at home for exercise.  Reviewed THR, pulse, RPE, sign and symptoms, and when to call 911 or MD.  Also discussed weather considerations and indoor options.  Pt voiced understanding.   Dr. Emily Filbert is Medical Director for [place] and [facility].

## 2016-10-02 ENCOUNTER — Encounter: Payer: Medicare HMO | Admitting: *Deleted

## 2016-10-02 DIAGNOSIS — Z955 Presence of coronary angioplasty implant and graft: Secondary | ICD-10-CM

## 2016-10-02 NOTE — Progress Notes (Signed)
Daily Session Note  Patient Details  Name: Linda Yang MRN: [id number] Date of Birth: Jan 29, 1941 Referring Provider:     Cardiac Rehab from 08/30/2016 in [facility] Cardiac and Pulmonary Rehab  Referring Provider  [facility]      Encounter Date: 10/02/2016  Check In:     Session Check In - 10/02/16 0821      Check-In   Location [facility]-Cardiac & Pulmonary Rehab   Staff Present Heath Lark, RN, BSN, CCRP;Lizabeth Fellner Freer, MA, ACSM RCEP, Exercise Physiologist;Amanda Oletta Darter, [place], ACSM CEP, Exercise Physiologist   Supervising physician immediately available to respond to emergencies See telemetry face sheet for immediately available ER MD   Medication changes reported     No   Fall or balance concerns reported    No   Warm-up and Cool-down Performed on first and last piece of equipment   Resistance Training Performed Yes   VAD Patient? No     Pain Assessment   Currently in Pain? No/denies   Multiple Pain Sites No         History  Smoking Status  . Never Smoker  Smokeless Tobacco  . Never Used    Goals Met:  Independence with exercise equipment Exercise tolerated well No report of cardiac concerns or symptoms Strength training completed today  Goals Unmet:  Not Applicable  Comments: Pt able to follow exercise prescription today without complaint.  Will continue to monitor for progression.    Dr. Emily Filbert is Medical Director for [place] and [facility].

## 2016-10-03 ENCOUNTER — Encounter: Payer: Self-pay | Admitting: *Deleted

## 2016-10-03 DIAGNOSIS — Z955 Presence of coronary angioplasty implant and graft: Secondary | ICD-10-CM

## 2016-10-03 NOTE — Progress Notes (Signed)
Cardiac Individual Treatment Plan  Patient Details  Name: Linda Yang MRN: [id number] Date of Birth: January 16, 1941 Referring Provider:     Cardiac Rehab from 08/30/2016 in [facility] Cardiac and Pulmonary Rehab  Referring Provider  [facility]      Initial Encounter Date:    Cardiac Rehab from 08/30/2016 in [facility] Cardiac and Pulmonary Rehab  Date  08/30/16  Referring Provider  [facility]      Visit Diagnosis: Status post coronary artery stent placement  Patient's Home Medications on Admission:  Current Outpatient Prescriptions:  .  aspirin EC 81 MG tablet, Take 81 mg by mouth at bedtime., Disp: , Rfl:  .  atorvastatin (LIPITOR) 40 MG tablet, Take by mouth., Disp: , Rfl:  .  Calcium Carbonate-Simethicone (ALKA-SELTZER HEARTBURN + GAS) 750-80 MG CHEW, Chew 1 tablet by mouth daily as needed (heartburn)., Disp: , Rfl:  .  Camphor-Menthol-Capsicum (TIGER BALM PAIN RELIEVING) 80-24-16 MG PTCH, Apply 1-3 patches topically daily as needed (pain)., Disp: , Rfl:  .  cholecalciferol (VITAMIN D) 400 units TABS tablet, Take 400 Units by mouth at bedtime., Disp: , Rfl:  .  ferrous sulfate 325 (65 FE) MG tablet, Take 325 mg by mouth at bedtime., Disp: , Rfl:  .  fexofenadine (ALLEGRA) 180 MG tablet, Take 180 mg by mouth at bedtime as needed for allergies or rhinitis., Disp: , Rfl:  .  HYDROcodone-acetaminophen (NORCO/VICODIN) 5-325 MG tablet, Take 1 tablet by mouth daily as needed for moderate pain., Disp: , Rfl:  .  metoprolol succinate (TOPROL-XL) 25 MG 24 hr tablet, Take by mouth., Disp: , Rfl:  .  nitroGLYCERIN (NITROSTAT) 0.4 MG SL tablet, Place 0.4 mg under the tongue every 5 (five) minutes as needed for chest pain. , Disp: , Rfl:  .  PARoxetine (PAXIL) 20 MG tablet, Take 20 mg by mouth at bedtime., Disp: , Rfl:  .  ticagrelor (BRILINTA) 90 MG TABS tablet, Take 1 tablet (90 mg total) by mouth 2 (two) times daily., Disp: 60 tablet, Rfl: 12 .  trolamine salicylate (ASPERCREME) 10 % cream, Apply 1 application  topically as needed for muscle pain., Disp: , Rfl:  .  vitamin B-12 (CYANOCOBALAMIN) 1000 MCG tablet, Take 1,000 mcg by mouth at bedtime., Disp: , Rfl:  .  Vitamin D, Ergocalciferol, (DRISDOL) 50000 units CAPS capsule, Take 50,000 Units by mouth every Thursday., Disp: , Rfl:   Past Medical History: Past Medical History:  Diagnosis Date  . Coronary artery disease   . High cholesterol     Tobacco Use: History  Smoking Status  . Never Smoker  Smokeless Tobacco  . Never Used    Labs: Recent Review Flowsheet Data    Labs for ITP Cardiac and Pulmonary Rehab Latest Ref Rng & Units 08/05/2009   Cholestrol 0 - 200 mg/dL 236(H)   LDLDIRECT mg/dL 142.2   HDL >39.00 mg/dL 45.90   Trlycerides 0.0 - 149.0 mg/dL 214.0(H)       Exercise Target Goals:    Exercise Program Goal: Individual exercise prescription set with THRR, safety & activity barriers. Participant demonstrates ability to understand and report RPE using BORG scale, to self-measure pulse accurately, and to acknowledge the importance of the exercise prescription.  Exercise Prescription Goal: Starting with aerobic activity 30 plus minutes a day, 3 days per week for initial exercise prescription. Provide home exercise prescription and guidelines that participant acknowledges understanding prior to discharge.  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 Has meds to use for flareups   Cardiac Risk Stratification High      6 Minute Walk:     6 Minute Walk    Row Name 08/30/16 1424 08/30/16 1532       6 Minute Walk   Phase Initial  -    Distance 1430 feet 1430 feet    Walk Time 6 minutes 6 minutes    # of Rest Breaks 0  -    MPH 2.7  -    METS 2.28  -    RPE 17 17    VO2 Peak 7.97   -    Symptoms Yes (comment) Yes (comment)    Comments sciatic pain 10 sciatic pain 10    Resting HR 61 bpm 61 bpm    Resting BP 124/90 124/90    Max Ex. HR 84 bpm 84 bpm    Max Ex. BP 142/80 142/80    2 Minute Post BP 128/88  -       Oxygen Initial Assessment:   Oxygen Re-Evaluation:   Oxygen Discharge (Final Oxygen Re-Evaluation):   Initial Exercise Prescription:     Initial Exercise Prescription - 08/30/16 1400      Date of Initial Exercise RX and Referring Provider   Date 08/30/16   Referring Provider [facility]     Treadmill   MPH 1.7   Grade 0   Minutes 15   METs 2.3     Recumbant Bike   Level 1   RPM 60   Watts 10   Minutes 15   METs 2.3     NuStep   Level 2   SPM 80   Minutes 15   METs 2.3     Prescription Details   Frequency (times per week) 3   Duration Progress to 45 minutes of aerobic exercise without signs/symptoms of physical distress     Intensity   THRR 40-80% of Max Heartrate 94-128   Ratings of Perceived Exertion 11-13   Perceived Dyspnea 0-4     Resistance Training   Training Prescription Yes   Weight 2   Reps 10-15      Perform Capillary Blood Glucose checks as needed.  Exercise Prescription Changes:     Exercise Prescription Changes    Row Name 08/30/16 1400 09/12/16 1500 09/27/16 1600         Response to Exercise   Blood Pressure (Admit) 124/90 122/60 130/72     Blood Pressure (Exercise) 142/80 124/60 126/60     Blood Pressure (Exit) 128/88 130/60 112/60     Heart Rate (Admit) 61 bpm 69 bpm 76 bpm     Heart Rate (Exercise) 86 bpm 113 bpm 77 bpm     Heart Rate (Exit) 65 bpm 62 bpm 61 bpm     Perceived Dyspnea (Exercise) 17 11 11      Symptoms  - none none     Comments  - first full day of exercise  -     Duration  - Progress to 45 minutes of aerobic exercise without signs/symptoms of physical distress Continue with 45 min of aerobic exercise without signs/symptoms of physical distress.     Intensity  - THRR  unchanged THRR unchanged       Progression   Progression  - Continue to progress workloads to maintain  intensity without signs/symptoms of physical distress. Continue to progress workloads to maintain intensity without signs/symptoms of physical distress.     Average METs  - 2 2.3       Resistance Training   Training Prescription  - Yes Yes     Weight  - 2 lbs 2 lbs     Reps  - 10-15 10-15       Interval Training   Interval Training  - No No       Treadmill   MPH  -  - 2     Grade  -  - 0     Minutes  -  - 15     METs  -  - 2.53       Recumbant Bike   Level  - 3 3     Watts  - 29 29     Minutes  - 15 15     METs  - 3.05 3.05       NuStep   Level  - 2 2     Minutes  - 15 15     METs  - 1.7 1.5       Home Exercise Plan   Plans to continue exercise at  -  - Home (comment)  walking and going to gym in neighborhood     Frequency  -  - Add 3 additional days to program exercise sessions.     Initial Home Exercises Provided  -  - 09/26/16        Exercise Comments:     Exercise Comments    Row Name 09/11/16 0915           Exercise Comments First full day of exercise!  Patient was oriented to gym and equipment including functions, settings, policies, and procedures.  Patient's individual exercise prescription and treatment plan were reviewed.  All starting workloads were established based on the results of the 6 minute walk test done at initial orientation visit.  The plan for exercise progression was also introduced and progression will be customized based on patient's performance and goals.          Exercise Goals and Review:     Exercise Goals    Row Name 08/30/16 1423             Exercise Goals   Increase Physical Activity Yes       Intervention Provide advice, education, support and counseling about physical activity/exercise needs.;Develop an individualized exercise prescription for aerobic and resistive training based on initial evaluation findings, risk  stratification, comorbidities and participant's personal goals.       Expected Outcomes Achievement of increased cardiorespiratory fitness and enhanced flexibility, muscular endurance and strength shown through measurements of functional capacity and personal statement of participant.       Increase Strength and Stamina Yes       Intervention Provide advice, education, support and counseling about physical activity/exercise needs.;Develop an individualized exercise prescription for aerobic and resistive training based on initial evaluation findings, risk stratification, comorbidities and participant's personal goals.       Expected Outcomes Achievement of increased cardiorespiratory fitness and enhanced flexibility, muscular endurance and strength shown through measurements of functional capacity and personal statement of participant.          Exercise Goals Re-Evaluation :     Exercise Goals Re-Evaluation    Row Name 09/12/16 1458 09/20/16 0911 09/25/16 1019 09/27/16 1601       Exercise Goal  Re-Evaluation   Exercise Goals Review Increase Physical Activity;Increase Strenth and Stamina Increase Physical Activity;Increase Strenth and Stamina Increase Physical Activity;Increase Strenth and Stamina Increase Physical Activity;Increase Strenth and Stamina    Comments Cadince has completed her first full day of exercise. She did well and we will continue to monitor her progression.  [name] has been doing well in rehab.  She is feeling stronger and has more energy.  She has already started to notice a diffence in how she is feeling.  It makes her want to get up and get out to do more to feel better. Reviewed home exercise with pt today.  Pt plans to walk and go to gym at home for exercise.  Reviewed THR, pulse, RPE, sign and symptoms, and when to call 911 or MD.  Also discussed weather considerations and indoor options.  Pt voiced understanding. [name] has been doing well in rehab.  She is up to 29 watts on the  recumbent bike.  Her attendance will become sporadiac with upcoming potential surgeries.  She is going to keep [place] up to date with everything.  We will continue to monitor her progression.     Expected Outcomes Short: Jennelle will continue to attend classes regularly.  Long: Increase physical activity.  Short: Review home exercise. Long: Continue to exercise regularly. Short: Add in home exercise.  Long: Exercise regulary here and at home. Short: Start walking at home.  Long: Make exercise part of routine.        Discharge Exercise Prescription (Final Exercise Prescription Changes):     Exercise Prescription Changes - 09/27/16 1600      Response to Exercise   Blood Pressure (Admit) 130/72   Blood Pressure (Exercise) 126/60   Blood Pressure (Exit) 112/60   Heart Rate (Admit) 76 bpm   Heart Rate (Exercise) 77 bpm   Heart Rate (Exit) 61 bpm   Perceived Dyspnea (Exercise) 11   Symptoms none   Duration Continue with 45 min of aerobic exercise without signs/symptoms of physical distress.   Intensity THRR unchanged     Progression   Progression Continue to progress workloads to maintain intensity without signs/symptoms of physical distress.   Average METs 2.3     Resistance Training   Training Prescription Yes   Weight 2 lbs   Reps 10-15     Interval Training   Interval Training No     Treadmill   MPH 2   Grade 0   Minutes 15   METs 2.53     Recumbant Bike   Level 3   Watts 29   Minutes 15   METs 3.05     NuStep   Level 2   Minutes 15   METs 1.5     Home Exercise Plan   Plans to continue exercise at Home (comment)  walking and going to gym in neighborhood   Frequency Add 3 additional days to program exercise sessions.   Initial Home Exercises Provided 09/26/16      Nutrition:  Target Goals: Understanding of nutrition guidelines, daily intake of sodium <1559m, cholesterol <2035m calories 30% from fat and 7% or less from saturated fats, daily to have 5 or more  servings of fruits and vegetables.  Biometrics:     Pre Biometrics - 08/30/16 1422      Pre Biometrics   Height 5' 3.5" (1.613 m)   Weight 203 lb 12.8 oz (92.4 kg)   Waist Circumference 43.5 inches   Hip Circumference 51 inches  Waist to Hip Ratio 0.85 %   BMI (Calculated) 35.6   Single Leg Stand 6.36 seconds       Nutrition Therapy Plan and Nutrition Goals:     Nutrition Therapy & Goals - 09/25/16 1155      Nutrition Therapy   Diet TLC   Drug/Food Interactions Statins/Certain Fruits   Protein (specify units) 7oz   Fiber 20 grams   Fruits and Vegetables 5 servings/day   Sodium 1500 grams     Personal Nutrition Goals   Personal Goal #2 Lose at least 10lbs -- by continuing to work on portion control, especially starches   Personal Goal #3 track food intake using free phone app   Comments Ms. Fross and her husband have both been making an effort to improve diet with lower fat and lower carb intake. She states limiting portions is the biggest challenge for her, but does want to continue with this goal.      Intervention Plan   Intervention Nutrition handout(s) given to patient.      Nutrition Discharge: Rate Your Plate Scores:     Nutrition Assessments - 08/30/16 1417      MEDFICTS Scores   Pre Score 80      Nutrition Goals Re-Evaluation:     Nutrition Goals Re-Evaluation    Row Name 09/20/16 1009             Goals   Current Weight 205 lb 14.4 oz (93.4 kg)       Nutrition Goal meet with dietician       Comment [name] has an appointment scheduled to meet with dietician on Aug 7 after class.  She would like help to guide her towards a healthy diet and continue to lose weight.       Expected Outcome Meet with dietician          Nutrition Goals Discharge (Final Nutrition Goals Re-Evaluation):     Nutrition Goals Re-Evaluation - 09/20/16 1009      Goals   Current Weight 205 lb 14.4 oz (93.4 kg)   Nutrition Goal meet with dietician   Comment [name]  has an appointment scheduled to meet with dietician on Aug 7 after class.  She would like help to guide her towards a healthy diet and continue to lose weight.   Expected Outcome Meet with dietician      Psychosocial: Target Goals: Acknowledge presence or absence of significant depression and/or stress, maximize coping skills, provide positive support system. Participant is able to verbalize types and ability to use techniques and skills needed for reducing stress and depression.   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 Has been on meds in past.  Stated that she handles it on her own without medication.       Quality of Life Scores:      Quality of Life - 08/30/16 1424      Quality of Life Scores   Health/Function Pre 23.57 %   Socioeconomic Pre 17.79 %   Psych/Spiritual Pre 23.14 %   Family Pre 27 %   GLOBAL Pre 22.58 %      PHQ-9: Recent Review Flowsheet Data    Depression screen [facility] 2/9 08/30/2016   Decreased Interest 0   Down, Depressed, Hopeless 0   PHQ -  2 Score 0   Altered sleeping 1   Tired, decreased energy 1   Change in appetite 2   Feeling bad or failure about yourself  0   Trouble concentrating 2   Moving slowly or fidgety/restless 2   Suicidal thoughts 0   PHQ-9 Score 8   Difficult doing work/chores Somewhat difficult     Interpretation of Total Score  Total Score Depression Severity:  1-4 = Minimal depression, 5-9 = Mild depression, 10-14 = Moderate depression, 15-19 = Moderately severe depression, 20-27 = Severe depression   Psychosocial Evaluation and Intervention:     Psychosocial Evaluation - 09/25/16 0954      Psychosocial Evaluation & Interventions   Interventions Encouraged to exercise with the program and follow exercise prescription;Relaxation education;Stress management education    Comments Counselor met with Ms. Lia Hopping ([name]) today for initial psychosocial evaluation.  She is a 76 year old who had a stent inserted on 5/30.  [name] has a strong support system with a spouse of 61 years and several daughters who live locally.  [name] is also actively involved in her local church.  She reports chronic sleep problems not being able to go to sleep.  She also states she has a history of depression and anxiety and takes medication for this which is helpful.  [name] has multiple stressors in her life with relationship problems; her own health; finances and her current living conditions.  [name] has goals to be healthier overall and "take charge of my life and better care of myself."  Counselor encouraged [name] to speak with her doctor or pharmacist about her sleep problems and we developed a sleep plan for her which includes limited caffeine and afternoon naps to see if this helps.  Counselor and staff will follow with [name] throughout the course of this program.    Expected Outcomes [name] will benefit from consistent exercise to achieve her stated goals.  The educational and psychoeducational components of this program will be helpful in managing her stress and illness better.     Continue Psychosocial Services  Follow up required by staff      Psychosocial Re-Evaluation:     Psychosocial Re-Evaluation    [place] Name 09/20/16 1011             Psychosocial Re-Evaluation   Current issues with Current Stress Concerns       Comments [name] states that her stress has gotten better since starting the program.  She has made the decision to just let things go and determine how important things really are.  Her new mantra: "Will it still be important a year from now?" Rehab hasgiven her more motivation to get out and about and to make herself better.        Expected Outcomes Short: Continue to have a positive attitude and cope with stress.  Long: Continue to work on coping with stress positively.        Interventions Encouraged to attend Cardiac Rehabilitation for the exercise;Stress management education       Continue Psychosocial Services  Follow up required by staff         Initial Review   Source of Stress Concerns Chronic Illness;Family;Financial          Psychosocial Discharge (Final Psychosocial Re-Evaluation):     Psychosocial Re-Evaluation - 09/20/16 1011      Psychosocial Re-Evaluation   Current issues with Current Stress Concerns   Comments [name] states that her stress has gotten better since starting  the program.  She has made the decision to just let things go and determine how important things really are.  Her new mantra: "Will it still be important a year from now?" Rehab hasgiven her more motivation to get out and about and to make herself better.    Expected Outcomes Short: Continue to have a positive attitude and cope with stress.  Long: Continue to work on coping with stress positively.   Interventions Encouraged to attend Cardiac Rehabilitation for the exercise;Stress management education   Continue Psychosocial Services  Follow up required by staff     Initial Review   Source of Stress Concerns Chronic Illness;Family;Financial      Vocational Rehabilitation: Provide vocational rehab assistance to qualifying candidates.   Vocational Rehab Evaluation & Intervention:     Vocational Rehab - 08/30/16 1426      Initial Vocational Rehab Evaluation & Intervention   Assessment shows need for Vocational Rehabilitation No      Education: Education Goals: Education classes will be provided on a weekly basis, covering required topics. Participant will state understanding/return demonstration of topics presented.  Learning Barriers/Preferences:     Learning Barriers/Preferences - 08/30/16 1426      Learning Barriers/Preferences   Learning Barriers Hearing   Learning Preferences Individual Instruction      Education Topics: General Nutrition  Guidelines/Fats and Fiber: -Group instruction provided by verbal, written material, models and posters to present the general guidelines for heart healthy nutrition. Gives an explanation and review of dietary fats and fiber.   Controlling Sodium/Reading Food Labels: -Group verbal and written material supporting the discussion of sodium use in heart healthy nutrition. Review and explanation with models, verbal and written materials for utilization of the food label.   Exercise Physiology & Risk Factors: - Group verbal and written instruction with models to review the exercise physiology of the cardiovascular system and associated critical values. Details cardiovascular disease risk factors and the goals associated with each risk factor.   Aerobic Exercise & Resistance Training: - Gives group verbal and written discussion on the health impact of inactivity. On the components of aerobic and resistive training programs and the benefits of this training and how to safely progress through these programs.   Flexibility, Balance, General Exercise Guidelines: - Provides group verbal and written instruction on the benefits of flexibility and balance training programs. Provides general exercise guidelines with specific guidelines to those with heart or lung disease. Demonstration and skill practice provided.   Stress Management: - Provides group verbal and written instruction about the health risks of elevated stress, cause of high stress, and healthy ways to reduce stress.   Cardiac Rehab from 10/02/2016 in [facility] Cardiac and Pulmonary Rehab  Date  09/11/16  Educator  [facility]  Instruction Review Code  2- meets goals/outcomes      Depression: - Provides group verbal and written instruction on the correlation between heart/lung disease and depressed mood, treatment options, and the stigmas associated with seeking treatment.   Anatomy & Physiology of the Heart: - Group verbal and written instruction and  models provide basic cardiac anatomy and physiology, with the coronary electrical and arterial systems. Review of: AMI, Angina, Valve disease, Heart Failure, Cardiac Arrhythmia, Pacemakers, and the ICD.   Cardiac Rehab from 10/02/2016 in [facility] Cardiac and Pulmonary Rehab  Date  09/13/16  Educator  [facility]  Instruction Review Code  2- meets goals/outcomes      Cardiac Procedures: - Group verbal and written instruction and models to describe  the testing methods done to diagnose heart disease. Reviews the outcomes of the test results. Describes the treatment choices: Medical Management, Angioplasty, or Coronary Bypass Surgery.   Cardiac Medications: - Group verbal and written instruction to review commonly prescribed medications for heart disease. Reviews the medication, class of the drug, and side effects. Includes the steps to properly store meds and maintain the prescription regimen.   Cardiac Rehab from 10/02/2016 in [facility] Cardiac and Pulmonary Rehab  Date  09/25/16 [8/7 Part One]  Educator  SB  Instruction Review Code  2- meets goals/outcomes      Go Sex-Intimacy & Heart Disease, Get SMART - Goal Setting: - Group verbal and written instruction through game format to discuss heart disease and the return to sexual intimacy. Provides group verbal and written material to discuss and apply goal setting through the application of the S.M.A.R.T. Method.   Other Matters of the Heart: - Provides group verbal, written materials and models to describe Heart Failure, Angina, Valve Disease, and Diabetes in the realm of heart disease. Includes description of the disease process and treatment options available to the cardiac patient.   Cardiac Rehab from 10/02/2016 in [facility] Cardiac and Pulmonary Rehab  Date  09/13/16  Educator  [facility]  Instruction Review Code  2- meets goals/outcomes      Exercise & Equipment Safety: - Individual verbal instruction and demonstration of equipment use and safety with use of the  equipment.   Cardiac Rehab from 10/02/2016 in [facility] Cardiac and Pulmonary Rehab  Date  08/30/16  Educator  SB  Instruction Review Code  2- meets goals/outcomes      Infection Prevention: - Provides verbal and written material to individual with discussion of infection control including proper hand washing and proper equipment cleaning during exercise session.   Cardiac Rehab from 10/02/2016 in [facility] Cardiac and Pulmonary Rehab  Date  08/30/16  Educator  SB  Instruction Review Code  2- meets goals/outcomes      Falls Prevention: - Provides verbal and written material to individual with discussion of falls prevention and safety.   Cardiac Rehab from 10/02/2016 in [facility] Cardiac and Pulmonary Rehab  Date  08/30/16  Educator  Sb  Instruction Review Code  2- meets goals/outcomes      Diabetes: - Individual verbal and written instruction to review signs/symptoms of diabetes, desired ranges of glucose level fasting, after meals and with exercise. Advice that pre and post exercise glucose checks will be done for 3 sessions at entry of program.    Knowledge Questionnaire Score:     Knowledge Questionnaire Score - 08/30/16 1426      Knowledge Questionnaire Score   Pre Score 20/28      Core Components/Risk Factors/Patient Goals at Admission:     Personal Goals and Risk Factors at Admission - 08/30/16 1429      Core Components/Risk Factors/Patient Goals on Admission   Hypertension Yes   Intervention Provide education on lifestyle modifcations including regular physical activity/exercise, weight management, moderate sodium restriction and increased consumption of fresh fruit, vegetables, and low fat dairy, alcohol moderation, and smoking cessation.;Monitor prescription use compliance.   Expected Outcomes Short Term: Continued assessment and intervention until BP is < 140/47m HG in hypertensive participants. < 130/855mHG in hypertensive participants with diabetes, heart failure or  chronic kidney disease.;Long Term: Maintenance of blood pressure at goal levels.   Lipids Yes   Intervention Provide education and support for participant on nutrition & aerobic/resistive exercise along with prescribed medications  to achieve LDL <50m, HDL >432m   Expected Outcomes Short Term: Participant states understanding of desired cholesterol values and is compliant with medications prescribed. Participant is following exercise prescription and nutrition guidelines.;Long Term: Cholesterol controlled with medications as prescribed, with individualized exercise RX and with personalized nutrition plan. Value goals: LDL < 7026mHDL > 40 mg.   Stress Yes   Intervention Offer individual and/or small group education and counseling on adjustment to heart disease, stress management and health-related lifestyle change. Teach and support self-help strategies.;Refer participants experiencing significant psychosocial distress to appropriate mental health specialists for further evaluation and treatment. When possible, include family members and significant others in education/counseling sessions.   Expected Outcomes Short Term: Participant demonstrates changes in health-related behavior, relaxation and other stress management skills, ability to obtain effective social support, and compliance with psychotropic medications if prescribed.;Long Term: Emotional wellbeing is indicated by absence of clinically significant psychosocial distress or social isolation.      Core Components/Risk Factors/Patient Goals Review:      Goals and Risk Factor Review    Row Name 09/20/16 0957             Core Components/Risk Factors/Patient Goals Review   Personal Goals Review Weight Management/Obesity;Hypertension;Lipids;Stress       Review [name]'s weight has started to come back down.  She has cut back on sugar and eating better. She is now more motivated to get up and get out of the house and be healthier.  [name]'s  blood pressures have been stable.  She has been doing well on her medications.  She is getting better with her stress levels as well.       Expected Outcomes Short: Continue to work on weight loss.  Long: Continue to work on risk factor modification.           Core Components/Risk Factors/Patient Goals at Discharge (Final Review):      Goals and Risk Factor Review - 09/20/16 0957      Core Components/Risk Factors/Patient Goals Review   Personal Goals Review Weight Management/Obesity;Hypertension;Lipids;Stress   Review [name]'s weight has started to come back down.  She has cut back on sugar and eating better. She is now more motivated to get up and get out of the house and be healthier.  [name]'s blood pressures have been stable.  She has been doing well on her medications.  She is getting better with her stress levels as well.   Expected Outcomes Short: Continue to work on weight loss.  Long: Continue to work on risk factor modification.       ITP Comments:     ITP Comments    Row Name 08/30/16 1409 09/05/16 0718 09/20/16 0913 10/03/16 0621     ITP Comments Medical review completed Initial ITP created.  Diagnosis documentation can be found [facility] encounter 07/10/2016 30 day review. Continue with ITP unless directed changes per Medical Director review    New to Program (P)  [name] will be scheduling a surgery for her cataracts and possibly for bladder tacking.  She will keep us [place] to date for dates of surgery and what the doctor says.  She may discharge prior to surgery and return once cleared after both surgiers. 30 day review. Continue with ITP unless directed changes per Medical Director review 30 day review.        Comments:

## 2016-10-04 DIAGNOSIS — Z955 Presence of coronary angioplasty implant and graft: Secondary | ICD-10-CM

## 2016-10-04 NOTE — Progress Notes (Signed)
Daily Session Note  Patient Details  Name: URSULA DERMODY MRN: [id number] Date of Birth: [birth year] Referring Provider:     Cardiac Rehab from 08/30/2016 in [facility] Cardiac and Pulmonary Rehab  Referring Provider  [facility]      Encounter Date: 10/04/2016  Check In:     Session Check In - 10/04/16 0856      Check-In   Location [facility]-Cardiac & Pulmonary Rehab   Staff Present Alberteen Sam, MA, ACSM RCEP, Exercise Physiologist;Zianne Schubring Oletta Darter, BA, ACSM CEP, Exercise Physiologist;Meredith Sherryll Burger, RN BSN   Supervising physician immediately available to respond to emergencies See telemetry face sheet for immediately available ER MD   Medication changes reported     No   Fall or balance concerns reported    No   Warm-up and Cool-down Performed on first and last piece of equipment   Resistance Training Performed Yes   VAD Patient? No     Pain Assessment   Currently in Pain? No/denies         History  Smoking Status  . Never Smoker  Smokeless Tobacco  . Never Used    Goals Met:  Independence with exercise equipment Exercise tolerated well No report of cardiac concerns or symptoms Strength training completed today  Goals Unmet:  Not Applicable  Comments: Pt able to follow exercise prescription today without complaint.  Will continue to monitor for progression.    Dr. Emily Filbert is Medical Director for [place] and [facility].

## 2016-10-08 DIAGNOSIS — M5416 Radiculopathy, lumbar region: Secondary | ICD-10-CM | POA: Diagnosis not present

## 2016-10-08 DIAGNOSIS — M5126 Other intervertebral disc displacement, lumbar region: Secondary | ICD-10-CM | POA: Diagnosis not present

## 2016-10-10 NOTE — Progress Notes (Signed)
Daily Session Note  Patient Details  Name: Linda Yang MRN: [id number] Date of Birth: 1940-03-17 Referring Provider:     Cardiac Rehab from 08/30/2016 in [facility] Cardiac and Pulmonary Rehab  Referring Provider  [facility]      Encounter Date: 09/13/2016  Check In:      History  Smoking Status  . Never Smoker  Smokeless Tobacco  . Never Used    Goals Met:  Proper associated with RPD/PD & O2 Sat Independence with exercise equipment Exercise tolerated well Strength training completed today  Goals Unmet:  Not Applicable  Comments: Pt able to follow exercise prescription today without complaint.  Will continue to monitor for progression.    Dr. Emily Filbert is Medical Director for [place] and [facility].

## 2016-10-11 ENCOUNTER — Encounter: Payer: Medicare HMO | Admitting: *Deleted

## 2016-10-11 DIAGNOSIS — Z955 Presence of coronary angioplasty implant and graft: Secondary | ICD-10-CM | POA: Diagnosis not present

## 2016-10-11 NOTE — Progress Notes (Signed)
Daily Session Note  Patient Details  Name: KORTNEE BAS MRN: [id number] Date of Birth: Sep 04, 1940 Referring Provider:     Cardiac Rehab from 08/30/2016 in [facility] Cardiac and Pulmonary Rehab  Referring Provider  [facility]      Encounter Date: 10/11/2016  Check In:     Session Check In - 10/11/16 0923      Check-In   Location [facility]-Cardiac & Pulmonary Rehab   Staff Present Alberteen Sam, MA, ACSM RCEP, Exercise Physiologist;Amanda Oletta Darter, BA, ACSM CEP, Exercise Physiologist;Meredith Sherryll Burger, RN BSN   Supervising physician immediately available to respond to emergencies See telemetry face sheet for immediately available ER MD   Medication changes reported     No   Fall or balance concerns reported    No   Warm-up and Cool-down Performed on first and last piece of equipment   Resistance Training Performed Yes   VAD Patient? No     Pain Assessment   Currently in Pain? No/denies   Multiple Pain Sites No         History  Smoking Status  . Never Smoker  Smokeless Tobacco  . Never Used    Goals Met:  Independence with exercise equipment Exercise tolerated well No report of cardiac concerns or symptoms Strength training completed today  Goals Unmet:  Not Applicable  Comments: Pt able to follow exercise prescription today without complaint.  Will continue to monitor for progression.    Dr. Emily Filbert is Medical Director for [place] and [facility].

## 2016-10-16 ENCOUNTER — Encounter: Payer: Medicare HMO | Admitting: *Deleted

## 2016-10-16 DIAGNOSIS — Z955 Presence of coronary angioplasty implant and graft: Secondary | ICD-10-CM | POA: Diagnosis not present

## 2016-10-16 NOTE — Progress Notes (Signed)
Daily Session Note  Patient Details  Name: Linda Yang MRN: [id number] Date of Birth: 1940/04/26 Referring Provider:     Cardiac Rehab from 08/30/2016 in [facility] Cardiac and Pulmonary Rehab  Referring Provider  [facility]      Encounter Date: 10/16/2016  Check In:     Session Check In - 10/16/16 0834      Check-In   Location [facility]-Cardiac & Pulmonary Rehab   Staff Present Heath Lark, RN, BSN, CCRP;Jessica Luan Pulling, MA, ACSM RCEP, Exercise Physiologist;Amanda Oletta Darter, [place], ACSM CEP, Exercise Physiologist   Supervising physician immediately available to respond to emergencies See telemetry face sheet for immediately available ER MD   Medication changes reported     No   Fall or balance concerns reported    No   Warm-up and Cool-down Performed on first and last piece of equipment   Resistance Training Performed Yes   VAD Patient? No     Pain Assessment   Currently in Pain? No/denies   Multiple Pain Sites No         History  Smoking Status  . Never Smoker  Smokeless Tobacco  . Never Used    Goals Met:  Independence with exercise equipment Exercise tolerated well Personal goals reviewed No report of cardiac concerns or symptoms Strength training completed today  Goals Unmet:  Not Applicable  Comments: Pt able to follow exercise prescription today without complaint.  Will continue to monitor for progression.   Dr. Emily Filbert is Medical Director for [place] and [facility].

## 2016-10-18 DIAGNOSIS — Z955 Presence of coronary angioplasty implant and graft: Secondary | ICD-10-CM | POA: Diagnosis not present

## 2016-10-18 NOTE — Progress Notes (Signed)
Daily Session Note  Patient Details  Name: Linda Yang MRN: [id number] Date of Birth: [birth year] Referring Provider:     Cardiac Rehab from 08/30/2016 in [facility] Cardiac and Pulmonary Rehab  Referring Provider  [facility]      Encounter Date: 10/18/2016  Check In:     Session Check In - 10/18/16 0926      Check-In   Location [facility]-Cardiac & Pulmonary Rehab   Staff Present Alberteen Sam, MA, ACSM RCEP, Exercise Physiologist;Tavien Chestnut Oletta Darter, BA, ACSM CEP, Exercise Physiologist;Krista Frederico Hamman, RN BSN   Supervising physician immediately available to respond to emergencies See telemetry face sheet for immediately available ER MD   Medication changes reported     No   Fall or balance concerns reported    No   Warm-up and Cool-down Performed on first and last piece of equipment   Resistance Training Performed Yes   VAD Patient? No     Pain Assessment   Currently in Pain? No/denies         History  Smoking Status  . Never Smoker  Smokeless Tobacco  . Never Used    Goals Met:  Independence with exercise equipment Exercise tolerated well No report of cardiac concerns or symptoms Strength training completed today  Goals Unmet:  Not Applicable  Comments: Pt able to follow exercise prescription today without complaint.  Will continue to monitor for progression.    Dr. Emily Filbert is Medical Director for [place] and [facility].

## 2016-10-23 ENCOUNTER — Encounter: Payer: Medicare HMO | Attending: Internal Medicine

## 2016-10-23 DIAGNOSIS — Z955 Presence of coronary angioplasty implant and graft: Secondary | ICD-10-CM | POA: Diagnosis not present

## 2016-10-23 NOTE — Progress Notes (Signed)
Daily Session Note  Patient Details  Name: TANIKA BRACCO MRN: [id number] Date of Birth: [birth year] Referring Provider:     Cardiac Rehab from 08/30/2016 in [facility] Cardiac and Pulmonary Rehab  Referring Provider  [facility]      Encounter Date: 10/23/2016  Check In:     Session Check In - 10/23/16 1000      Check-In   Location [facility]-Cardiac & Pulmonary Rehab   Staff Present Nada Maclachlan, BA, ACSM CEP, Exercise Physiologist;Susanne Bice, RN, BSN, CCRP;Jessica Luan Pulling, MA, ACSM RCEP, Exercise Physiologist   Supervising physician immediately available to respond to emergencies See telemetry face sheet for immediately available ER MD   Medication changes reported     No   Fall or balance concerns reported    No   Warm-up and Cool-down Performed on first and last piece of equipment         History  Smoking Status  . Never Smoker  Smokeless Tobacco  . Never Used    Goals Met:  Independence with exercise equipment Exercise tolerated well No report of cardiac concerns or symptoms Strength training completed today  Goals Unmet:  Not Applicable  Comments: Reviewed RPE scale, THR and program prescription with pt today.  Pt voiced understanding and was given a copy of goals to take home.   Short: Use RPE daily to regulate intensity.  Long: Follow program prescription in THR.    Dr. Emily Filbert is Medical Director for [place] and [facility].

## 2016-10-24 DIAGNOSIS — I10 Essential (primary) hypertension: Secondary | ICD-10-CM | POA: Diagnosis not present

## 2016-10-24 DIAGNOSIS — N8110 Cystocele, unspecified: Secondary | ICD-10-CM | POA: Diagnosis not present

## 2016-10-24 DIAGNOSIS — R400 Somnolence: Secondary | ICD-10-CM | POA: Diagnosis not present

## 2016-10-24 DIAGNOSIS — E782 Mixed hyperlipidemia: Secondary | ICD-10-CM | POA: Diagnosis not present

## 2016-10-24 DIAGNOSIS — E6609 Other obesity due to excess calories: Secondary | ICD-10-CM | POA: Diagnosis not present

## 2016-10-24 DIAGNOSIS — E559 Vitamin D deficiency, unspecified: Secondary | ICD-10-CM | POA: Diagnosis not present

## 2016-10-25 DIAGNOSIS — Z955 Presence of coronary angioplasty implant and graft: Secondary | ICD-10-CM

## 2016-10-25 DIAGNOSIS — E559 Vitamin D deficiency, unspecified: Secondary | ICD-10-CM | POA: Diagnosis not present

## 2016-10-25 DIAGNOSIS — E782 Mixed hyperlipidemia: Secondary | ICD-10-CM | POA: Diagnosis not present

## 2016-10-25 DIAGNOSIS — I10 Essential (primary) hypertension: Secondary | ICD-10-CM | POA: Diagnosis not present

## 2016-10-25 NOTE — Progress Notes (Signed)
Daily Session Note  Patient Details  Name: Linda Yang MRN: [id number] Date of Birth: 1940-03-27 Referring Provider:     Cardiac Rehab from 08/30/2016 in [facility] Cardiac and Pulmonary Rehab  Referring Provider  [facility]      Encounter Date: 10/25/2016  Check In:     Session Check In - 10/25/16 0843      Check-In   Location [facility]-Cardiac & Pulmonary Rehab   Staff Present Alberteen Sam, MA, ACSM RCEP, Exercise Physiologist;Amanda Oletta Darter, BA, ACSM CEP, Exercise Physiologist;Meredith Sherryll Burger, RN BSN   Supervising physician immediately available to respond to emergencies See telemetry face sheet for immediately available ER MD   Medication changes reported     No   Fall or balance concerns reported    No   Warm-up and Cool-down Performed on first and last piece of equipment   Resistance Training Performed Yes   VAD Patient? No     Pain Assessment   Currently in Pain? No/denies         History  Smoking Status  . Never Smoker  Smokeless Tobacco  . Never Used    Goals Met:  Independence with exercise equipment Exercise tolerated well No report of cardiac concerns or symptoms Strength training completed today  Goals Unmet:  Not Applicable  Comments: Pt able to follow exercise prescription today without complaint.  Will continue to monitor for progression.    Dr. Emily Filbert is Medical Director for [place] and [facility].

## 2016-10-26 DIAGNOSIS — R079 Chest pain, unspecified: Secondary | ICD-10-CM | POA: Diagnosis not present

## 2016-10-26 DIAGNOSIS — I2510 Atherosclerotic heart disease of native coronary artery without angina pectoris: Secondary | ICD-10-CM | POA: Diagnosis not present

## 2016-10-26 DIAGNOSIS — R002 Palpitations: Secondary | ICD-10-CM | POA: Diagnosis not present

## 2016-10-31 ENCOUNTER — Encounter: Payer: Self-pay | Admitting: *Deleted

## 2016-10-31 DIAGNOSIS — E875 Hyperkalemia: Secondary | ICD-10-CM | POA: Diagnosis not present

## 2016-10-31 DIAGNOSIS — Z955 Presence of coronary angioplasty implant and graft: Secondary | ICD-10-CM

## 2016-10-31 NOTE — Progress Notes (Signed)
Cardiac Individual Treatment Plan  Patient Details  Name: Linda Yang MRN: [id number] Date of Birth: 05/09/1940 Referring Provider:     Cardiac Rehab from 08/30/2016 in [facility] Cardiac and Pulmonary Rehab  Referring Provider  [facility]      Initial Encounter Date:    Cardiac Rehab from 08/30/2016 in [facility] Cardiac and Pulmonary Rehab  Date  08/30/16  Referring Provider  [facility]      Visit Diagnosis: Status post coronary artery stent placement  Patient's Home Medications on Admission:  Current Outpatient Prescriptions:  .  aspirin EC 81 MG tablet, Take 81 mg by mouth at bedtime., Disp: , Rfl:  .  atorvastatin (LIPITOR) 40 MG tablet, Take by mouth., Disp: , Rfl:  .  Calcium Carbonate-Simethicone (ALKA-SELTZER HEARTBURN + GAS) 750-80 MG CHEW, Chew 1 tablet by mouth daily as needed (heartburn)., Disp: , Rfl:  .  Camphor-Menthol-Capsicum (TIGER BALM PAIN RELIEVING) 80-24-16 MG PTCH, Apply 1-3 patches topically daily as needed (pain)., Disp: , Rfl:  .  cholecalciferol (VITAMIN D) 400 units TABS tablet, Take 400 Units by mouth at bedtime., Disp: , Rfl:  .  ferrous sulfate 325 (65 FE) MG tablet, Take 325 mg by mouth at bedtime., Disp: , Rfl:  .  fexofenadine (ALLEGRA) 180 MG tablet, Take 180 mg by mouth at bedtime as needed for allergies or rhinitis., Disp: , Rfl:  .  HYDROcodone-acetaminophen (NORCO/VICODIN) 5-325 MG tablet, Take 1 tablet by mouth daily as needed for moderate pain., Disp: , Rfl:  .  metoprolol succinate (TOPROL-XL) 25 MG 24 hr tablet, Take by mouth., Disp: , Rfl:  .  nitroGLYCERIN (NITROSTAT) 0.4 MG SL tablet, Place 0.4 mg under the tongue every 5 (five) minutes as needed for chest pain. , Disp: , Rfl:  .  PARoxetine (PAXIL) 20 MG tablet, Take 20 mg by mouth at bedtime., Disp: , Rfl:  .  ticagrelor (BRILINTA) 90 MG TABS tablet, Take 1 tablet (90 mg total) by mouth 2 (two) times daily., Disp: 60 tablet, Rfl: 12 .  trolamine salicylate (ASPERCREME) 10 % cream, Apply 1 application  topically as needed for muscle pain., Disp: , Rfl:  .  vitamin B-12 (CYANOCOBALAMIN) 1000 MCG tablet, Take 1,000 mcg by mouth at bedtime., Disp: , Rfl:  .  Vitamin D, Ergocalciferol, (DRISDOL) 50000 units CAPS capsule, Take 50,000 Units by mouth every Thursday., Disp: , Rfl:   Past Medical History: Past Medical History:  Diagnosis Date  . Coronary artery disease   . High cholesterol     Tobacco Use: History  Smoking Status  . Never Smoker  Smokeless Tobacco  . Never Used    Labs: Recent Review Flowsheet Data    Labs for ITP Cardiac and Pulmonary Rehab Latest Ref Rng & Units 08/05/2009   Cholestrol 0 - 200 mg/dL 236(H)   LDLDIRECT mg/dL 142.2   HDL >39.00 mg/dL 45.90   Trlycerides 0.0 - 149.0 mg/dL 214.0(H)       Exercise Target Goals:    Exercise Program Goal: Individual exercise prescription set with THRR, safety & activity barriers. Participant demonstrates ability to understand and report RPE using BORG scale, to self-measure pulse accurately, and to acknowledge the importance of the exercise prescription.  Exercise Prescription Goal: Starting with aerobic activity 30 plus minutes a day, 3 days per week for initial exercise prescription. Provide home exercise prescription and guidelines that participant acknowledges understanding prior to discharge.  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 Has meds to use for flareups   Cardiac Risk Stratification High      6 Minute Walk:     6 Minute Walk    Row Name 08/30/16 1424 08/30/16 1532       6 Minute Walk   Phase Initial  -    Distance 1430 feet 1430 feet    Walk Time 6 minutes 6 minutes    # of Rest Breaks 0  -    MPH 2.7  -    METS 2.28  -    RPE 17 17    VO2 Peak 7.97   -    Symptoms Yes (comment) Yes (comment)    Comments sciatic pain 10 sciatic pain 10    Resting HR 61 bpm 61 bpm    Resting BP 124/90 124/90    Max Ex. HR 84 bpm 84 bpm    Max Ex. BP 142/80 142/80    2 Minute Post BP 128/88  -       Oxygen Initial Assessment:   Oxygen Re-Evaluation:   Oxygen Discharge (Final Oxygen Re-Evaluation):   Initial Exercise Prescription:     Initial Exercise Prescription - 08/30/16 1400      Date of Initial Exercise RX and Referring Provider   Date 08/30/16   Referring Provider [facility]     Treadmill   MPH 1.7   Grade 0   Minutes 15   METs 2.3     Recumbant Bike   Level 1   RPM 60   Watts 10   Minutes 15   METs 2.3     NuStep   Level 2   SPM 80   Minutes 15   METs 2.3     Prescription Details   Frequency (times per week) 3   Duration Progress to 45 minutes of aerobic exercise without signs/symptoms of physical distress     Intensity   THRR 40-80% of Max Heartrate 94-128   Ratings of Perceived Exertion 11-13   Perceived Dyspnea 0-4     Resistance Training   Training Prescription Yes   Weight 2   Reps 10-15      Perform Capillary Blood Glucose checks as needed.  Exercise Prescription Changes:     Exercise Prescription Changes    Row Name 08/30/16 1400 09/12/16 1500 09/27/16 1600 10/12/16 1400 10/23/16 1600     Response to Exercise   Blood Pressure (Admit) 124/90 122/60 130/72 122/82 126/60   Blood Pressure (Exercise) 142/80 124/60 126/60 126/72 132/70   Blood Pressure (Exit) 128/88 130/60 112/60 118/66 124/70   Heart Rate (Admit) 61 bpm 69 bpm 76 bpm 79 bpm 71 bpm   Heart Rate (Exercise) 86 bpm 113 bpm 77 bpm 87 bpm 93 bpm   Heart Rate (Exit) 65 bpm 62 bpm 61 bpm 57 bpm 64 bpm   Perceived Dyspnea (Exercise) 17 11 11 13 13    Symptoms  - none none nonw none   Comments  - first full day of exercise  -  -  -   Duration  - Progress to 45 minutes of aerobic exercise without signs/symptoms of physical distress  Continue with 45 min of aerobic exercise without signs/symptoms of physical distress. Continue with 45 min of aerobic exercise without signs/symptoms of physical distress. Continue with 45 min of aerobic exercise without signs/symptoms  of physical distress.   Intensity  - THRR unchanged THRR unchanged THRR unchanged THRR unchanged     Progression   Progression  - Continue to progress workloads to maintain intensity without signs/symptoms of physical distress. Continue to progress workloads to maintain intensity without signs/symptoms of physical distress. Continue to progress workloads to maintain intensity without signs/symptoms of physical distress. Continue to progress workloads to maintain intensity without signs/symptoms of physical distress.   Average METs  - 2 2.3 2.42 2.45     Resistance Training   Training Prescription  - Yes Yes Yes Yes   Weight  - 2 lbs 2 lbs 2 lbs 2 lbs   Reps  - 10-15 10-15 10-15 10-15     Interval Training   Interval Training  - No No No No     Treadmill   MPH  -  - 2 2 2.3   Grade  -  - 0 0 0   Minutes  -  - 15 15 15    METs  -  - 2.53 2.53 2.76     Recumbant Bike   Level  - 3 3 3 3    Watts  - 29 29 25 25    Minutes  - 15 15 15 15    METs  - 3.05 3.05 2.83 2.83     NuStep   Level  - 2 2 2 2    Minutes  - 15 15 15 15    METs  - 1.7 1.5 2 1.8     Home Exercise Plan   Plans to continue exercise at  -  - Home (comment)  walking and going to gym in neighborhood Home (comment)  walking and going to gym in neighborhood Home (comment)  walking and going to gym in neighborhood   Frequency  -  - Add 3 additional days to program exercise sessions. Add 3 additional days to program exercise sessions. Add 3 additional days to program exercise sessions.   Initial Home Exercises Provided  -  - 09/26/16 09/26/16 09/26/16      Exercise Comments:     Exercise Comments    Row Name 09/11/16 0915           Exercise Comments First full day of exercise!  Patient  was oriented to gym and equipment including functions, settings, policies, and procedures.  Patient's individual exercise prescription and treatment plan were reviewed.  All starting workloads were established based on the results of the 6 minute walk test done at initial orientation visit.  The plan for exercise progression was also introduced and progression will be customized based on patient's performance and goals.          Exercise Goals and Review:     Exercise Goals    Row Name 08/30/16 1423             Exercise Goals   Increase Physical Activity Yes       Intervention Provide advice, education, support and counseling about physical activity/exercise needs.;Develop an individualized exercise prescription for aerobic and resistive training based on initial evaluation findings, risk stratification, comorbidities and participant's personal goals.       Expected Outcomes Achievement of increased cardiorespiratory fitness and enhanced flexibility, muscular endurance and strength shown through measurements of functional capacity and personal statement of participant.       Increase Strength and Stamina Yes       Intervention Provide advice, education, support and counseling about physical activity/exercise needs.;Develop an individualized exercise prescription for  aerobic and resistive training based on initial evaluation findings, risk stratification, comorbidities and participant's personal goals.       Expected Outcomes Achievement of increased cardiorespiratory fitness and enhanced flexibility, muscular endurance and strength shown through measurements of functional capacity and personal statement of participant.          Exercise Goals Re-Evaluation :     Exercise Goals Re-Evaluation    Row Name 09/12/16 1458 09/20/16 0911 09/25/16 1019 09/27/16 1601 10/12/16 1426     Exercise Goal Re-Evaluation   Exercise Goals Review Increase Physical Activity;Increase Strenth and Stamina  Increase Physical Activity;Increase Strenth and Stamina Increase Physical Activity;Increase Strenth and Stamina Increase Physical Activity;Increase Strenth and Stamina Increase Physical Activity;Increase Strenth and Stamina   Comments Linda Yang has completed her first full day of exercise. She did well and we will continue to monitor her progression.  Linda Yang has been doing well in rehab.  She is feeling stronger and has more energy.  She has already started to notice a diffence in how she is feeling.  It makes her want to get up and get out to do more to feel better. Reviewed home exercise with pt today.  Pt plans to walk and go to gym at home for exercise.  Reviewed THR, pulse, RPE, sign and symptoms, and when to call 911 or MD.  Also discussed weather considerations and indoor options.  Pt voiced understanding. Linda Yang has been doing well in rehab.  She is up to 29 watts on the recumbent bike.  Her attendance will become sporadiac with upcoming potential surgeries.  She is going to keep [place] up to date with everything.  We will continue to monitor her progression.  Linda Yang has been doing well in rehab.  She is now up to level 3 on the bike!  We will continue to monitor her progression.    Expected Outcomes Short: Linda Yang will continue to attend classes regularly.  Long: Increase physical activity.  Short: Review home exercise. Long: Continue to exercise regularly. Short: Add in home exercise.  Long: Exercise regulary here and at home. Short: Start walking at home.  Long: Make exercise part of routine.  Short: Walk more at home.  Long: Continue to build strength and stamina.    [place] Name 10/23/16 1642             Exercise Goal Re-Evaluation   Exercise Goals Review Increase Physical Activity;Increase Strength and Stamina       Comments Linda Yang continues to do well in rehab.  She is now up to 2.3 mph on the treadmill. We will continue to monitor her progression.        Expected Outcomes Short: Increase workloads.  Long:  Exercise more independently.          Discharge Exercise Prescription (Final Exercise Prescription Changes):     Exercise Prescription Changes - 10/23/16 1600      Response to Exercise   Blood Pressure (Admit) 126/60   Blood Pressure (Exercise) 132/70   Blood Pressure (Exit) 124/70   Heart Rate (Admit) 71 bpm   Heart Rate (Exercise) 93 bpm   Heart Rate (Exit) 64 bpm   Perceived Dyspnea (Exercise) 13   Symptoms none   Duration Continue with 45 min of aerobic exercise without signs/symptoms of physical distress.   Intensity THRR unchanged     Progression   Progression Continue to progress workloads to maintain intensity without signs/symptoms of physical distress.   Average METs 2.45     Resistance  Training   Training Prescription Yes   Weight 2 lbs   Reps 10-15     Interval Training   Interval Training No     Treadmill   MPH 2.3   Grade 0   Minutes 15   METs 2.76     Recumbant Bike   Level 3   Watts 25   Minutes 15   METs 2.83     NuStep   Level 2   Minutes 15   METs 1.8     Home Exercise Plan   Plans to continue exercise at Home (comment)  walking and going to gym in neighborhood   Frequency Add 3 additional days to program exercise sessions.   Initial Home Exercises Provided 09/26/16      Nutrition:  Target Goals: Understanding of nutrition guidelines, daily intake of sodium <1556m, cholesterol <2043m calories 30% from fat and 7% or less from saturated fats, daily to have 5 or more servings of fruits and vegetables.  Biometrics:     Pre Biometrics - 08/30/16 1422      Pre Biometrics   Height 5' 3.5" (1.613 m)   Weight 203 lb 12.8 oz (92.4 kg)   Waist Circumference 43.5 inches   Hip Circumference 51 inches   Waist to Hip Ratio 0.85 %   BMI (Calculated) 35.6   Single Leg Stand 6.36 seconds       Nutrition Therapy Plan and Nutrition Goals:     Nutrition Therapy & Goals - 09/25/16 1155      Nutrition Therapy   Diet TLC   Drug/Food  Interactions Statins/Certain Fruits   Protein (specify units) 7oz   Fiber 20 grams   Fruits and Vegetables 5 servings/day   Sodium 1500 grams     Personal Nutrition Goals   Personal Goal #2 Lose at least 10lbs -- by continuing to work on portion control, especially starches   Personal Goal #3 track food intake using free phone app   Comments Ms. IsBudand her husband have both been making an effort to improve diet with lower fat and lower carb intake. She states limiting portions is the biggest challenge for her, but does want to continue with this goal.      Intervention Plan   Intervention Nutrition handout(s) given to patient.      Nutrition Discharge: Rate Your Plate Scores:     Nutrition Assessments - 08/30/16 1417      MEDFICTS Scores   Pre Score 80      Nutrition Goals Re-Evaluation:     Nutrition Goals Re-Evaluation    Row Name 09/20/16 1009             Goals   Current Weight 205 lb 14.4 oz (93.4 kg)       Nutrition Goal meet with dietician       Comment Linda Yang has an appointment scheduled to meet with dietician on Aug 7 after class.  She would like help to guide her towards a healthy diet and continue to lose weight.       Expected Outcome Meet with dietician          Nutrition Goals Discharge (Final Nutrition Goals Re-Evaluation):     Nutrition Goals Re-Evaluation - 09/20/16 1009      Goals   Current Weight 205 lb 14.4 oz (93.4 kg)   Nutrition Goal meet with dietician   Comment Linda Yang has an appointment scheduled to meet with dietician on Aug 7 after class.  She  would like help to guide her towards a healthy diet and continue to lose weight.   Expected Outcome Meet with dietician      Psychosocial: Target Goals: Acknowledge presence or absence of significant depression and/or stress, maximize coping skills, provide positive support system. Participant is able to verbalize types and ability to use techniques and skills needed for reducing stress and  depression.   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 Has been on meds in past.  Stated that she handles it on her own without medication.       Quality of Life Scores:      Quality of Life - 08/30/16 1424      Quality of Life Scores   Health/Function Pre 23.57 %   Socioeconomic Pre 17.79 %   Psych/Spiritual Pre 23.14 %   Family Pre 27 %   GLOBAL Pre 22.58 %      PHQ-9: Recent Review Flowsheet Data    Depression screen [facility] 2/9 08/30/2016   Decreased Interest 0   Down, Depressed, Hopeless 0   PHQ - 2 Score 0   Altered sleeping 1   Tired, decreased energy 1   Change in appetite 2   Feeling bad or failure about yourself  0   Trouble concentrating 2   Moving slowly or fidgety/restless 2   Suicidal thoughts 0   PHQ-9 Score 8   Difficult doing work/chores Somewhat difficult     Interpretation of Total Score  Total Score Depression Severity:  1-4 = Minimal depression, 5-9 = Mild depression, 10-14 = Moderate depression, 15-19 = Moderately severe depression, 20-27 = Severe depression   Psychosocial Evaluation and Intervention:     Psychosocial Evaluation - 09/25/16 0954      Psychosocial Evaluation & Interventions   Interventions Encouraged to exercise with the program and follow exercise prescription;Relaxation education;Stress management education   Comments Counselor met with Ms. Lia Hopping (Linda Yang) today for initial psychosocial evaluation.  She is a 76 year old who had a stent inserted on 5/30.  Linda Yang has a strong support system with a spouse of 5 years and several daughters who live locally.  Linda Yang is also actively involved in her local church.  She reports chronic sleep problems not being able to go to sleep.  She also states she has a history of depression and anxiety and takes medication for this which is helpful.  Linda Yang  has multiple stressors in her life with relationship problems; her own health; finances and her current living conditions.  Linda Yang has goals to be healthier overall and "take charge of my life and better care of myself."  Counselor encouraged Linda Yang to speak with her doctor or pharmacist about her sleep problems and we developed a sleep plan for her which includes limited caffeine and afternoon naps to see if this helps.  Counselor and staff will follow with Linda Yang throughout the course of this program.    Expected Outcomes Linda Yang will benefit from consistent exercise to achieve her stated goals.  The educational and psychoeducational components of this program will be helpful in managing her stress and illness better.     Continue Psychosocial Services  Follow up required by staff      Psychosocial Re-Evaluation:     Psychosocial Re-Evaluation    [place] Name 09/20/16  1011             Psychosocial Re-Evaluation   Current issues with Current Stress Concerns       Comments Linda Yang states that her stress has gotten better since starting the program.  She has made the decision to just let things go and determine how important things really are.  Her new mantra: "Will it still be important a year from now?" Rehab hasgiven her more motivation to get out and about and to make herself better.        Expected Outcomes Short: Continue to have a positive attitude and cope with stress.  Long: Continue to work on coping with stress positively.       Interventions Encouraged to attend Cardiac Rehabilitation for the exercise;Stress management education       Continue Psychosocial Services  Follow up required by staff         Initial Review   Source of Stress Concerns Chronic Illness;Family;Financial          Psychosocial Discharge (Final Psychosocial Re-Evaluation):     Psychosocial Re-Evaluation - 09/20/16 1011      Psychosocial Re-Evaluation   Current issues with Current Stress Concerns   Comments Linda Yang  states that her stress has gotten better since starting the program.  She has made the decision to just let things go and determine how important things really are.  Her new mantra: "Will it still be important a year from now?" Rehab hasgiven her more motivation to get out and about and to make herself better.    Expected Outcomes Short: Continue to have a positive attitude and cope with stress.  Long: Continue to work on coping with stress positively.   Interventions Encouraged to attend Cardiac Rehabilitation for the exercise;Stress management education   Continue Psychosocial Services  Follow up required by staff     Initial Review   Source of Stress Concerns Chronic Illness;Family;Financial      Vocational Rehabilitation: Provide vocational rehab assistance to qualifying candidates.   Vocational Rehab Evaluation & Intervention:     Vocational Rehab - 08/30/16 1426      Initial Vocational Rehab Evaluation & Intervention   Assessment shows need for Vocational Rehabilitation No      Education: Education Goals: Education classes will be provided on a variety of topics geared toward better understanding of heart health and risk factor modification. Participant will state understanding/return demonstration of topics presented as noted by education test scores.  Learning Barriers/Preferences:     Learning Barriers/Preferences - 08/30/16 1426      Learning Barriers/Preferences   Learning Barriers Hearing   Learning Preferences Individual Instruction      Education Topics: General Nutrition Guidelines/Fats and Fiber: -Group instruction provided by verbal, written material, models and posters to present the general guidelines for heart healthy nutrition. Gives an explanation and review of dietary fats and fiber.   Controlling Sodium/Reading Food Labels: -Group verbal and written material supporting the discussion of sodium use in heart healthy nutrition. Review and explanation  with models, verbal and written materials for utilization of the food label.   Cardiac Rehab from 10/25/2016 in [facility] Cardiac and Pulmonary Rehab  Date  10/16/16  Educator  PI  Instruction Review Code  1- Verbalizes Understanding      Exercise Physiology & Risk Factors: - Group verbal and written instruction with models to review the exercise physiology of the cardiovascular system and associated critical values. Details cardiovascular disease risk factors and the goals  associated with each risk factor.   Cardiac Rehab from 10/25/2016 in [facility] Cardiac and Pulmonary Rehab  Date  10/25/16  Educator  [facility]  Instruction Review Code  1- Verbalizes Understanding      Aerobic Exercise & Resistance Training: - Gives group verbal and written discussion on the health impact of inactivity. On the components of aerobic and resistive training programs and the benefits of this training and how to safely progress through these programs.   Flexibility, Balance, General Exercise Guidelines: - Provides group verbal and written instruction on the benefits of flexibility and balance training programs. Provides general exercise guidelines with specific guidelines to those with heart or lung disease. Demonstration and skill practice provided.   Stress Management: - Provides group verbal and written instruction about the health risks of elevated stress, cause of high stress, and healthy ways to reduce stress.   Cardiac Rehab from 10/25/2016 in [facility] Cardiac and Pulmonary Rehab  Date  09/11/16  Educator  [place]      Depression: - Provides group verbal and written instruction on the correlation between heart/lung disease and depressed mood, treatment options, and the stigmas associated with seeking treatment.   Cardiac Rehab from 10/25/2016 in [facility] Cardiac and Pulmonary Rehab  Date  10/04/16  Educator  [facility]  Instruction Review Code (retired)  2- meets goals/outcomes  Instruction Review Code  1- Armed forces training and education officer & Physiology of the Heart: - Group verbal and written instruction and models provide basic cardiac anatomy and physiology, with the coronary electrical and arterial systems. Review of: AMI, Angina, Valve disease, Heart Failure, Cardiac Arrhythmia, Pacemakers, and the ICD.   Cardiac Rehab from 10/25/2016 in [facility] Cardiac and Pulmonary Rehab  Date  09/13/16  Educator  [facility]      Cardiac Procedures: - Group verbal and written instruction to review commonly prescribed medications for heart disease. Reviews the medication, class of the drug, and side effects. Includes the steps to properly store meds and maintain the prescription regimen. (beta blockers and nitrates)   Cardiac Medications I: - Group verbal and written instruction to review commonly prescribed medications for heart disease. Reviews the medication, class of the drug, and side effects. Includes the steps to properly store meds and maintain the prescription regimen.   Cardiac Rehab from 10/25/2016 in [facility] Cardiac and Pulmonary Rehab  Date  09/25/16 [8/7 Part One]  Educator  SB      Cardiac Medications II: -Group verbal and written instruction to review commonly prescribed medications for heart disease. Reviews the medication, class of the drug, and side effects. (all other drug classes)    Go Sex-Intimacy & Heart Disease, Get SMART - Goal Setting: - Group verbal and written instruction through game format to discuss heart disease and the return to sexual intimacy. Provides group verbal and written material to discuss and apply goal setting through the application of the S.M.A.R.T. Method.   Other Matters of the Heart: - Provides group verbal, written materials and models to describe Heart Failure, Angina, Valve Disease, Peripheral Artery Disease, and Diabetes in the realm of heart disease. Includes description of the disease process and treatment options available to the cardiac patient.   Cardiac Rehab from 10/25/2016 in  [facility] Cardiac and Pulmonary Rehab  Date  09/13/16  Educator  [facility]      Exercise & Equipment Safety: - Individual verbal instruction and demonstration of equipment use and safety with use of the equipment.   Cardiac Rehab from 10/25/2016  in [facility] Cardiac and Pulmonary Rehab  Date  08/30/16  Educator  SB      Infection Prevention: - Provides verbal and written material to individual with discussion of infection control including proper hand washing and proper equipment cleaning during exercise session.   Cardiac Rehab from 10/25/2016 in [facility] Cardiac and Pulmonary Rehab  Date  08/30/16  Educator  SB      Falls Prevention: - Provides verbal and written material to individual with discussion of falls prevention and safety.   Cardiac Rehab from 10/25/2016 in [facility] Cardiac and Pulmonary Rehab  Date  08/30/16  Educator  Sb  Instruction Review Code (retired)  2- meets goals/outcomes      Diabetes: - Individual verbal and written instruction to review signs/symptoms of diabetes, desired ranges of glucose level fasting, after meals and with exercise. Acknowledge that pre and post exercise glucose checks will be done for 3 sessions at entry of program.   Other: -Provides group and verbal instruction on various topics (see comments)    Knowledge Questionnaire Score:     Knowledge Questionnaire Score - 08/30/16 1426      Knowledge Questionnaire Score   Pre Score 20/28      Core Components/Risk Factors/Patient Goals at Admission:     Personal Goals and Risk Factors at Admission - 08/30/16 1429      Core Components/Risk Factors/Patient Goals on Admission   Hypertension Yes   Intervention Provide education on lifestyle modifcations including regular physical activity/exercise, weight management, moderate sodium restriction and increased consumption of fresh fruit, vegetables, and low fat dairy, alcohol moderation, and smoking cessation.;Monitor prescription use compliance.   Expected  Outcomes Short Term: Continued assessment and intervention until BP is < 140/80m HG in hypertensive participants. < 130/881mHG in hypertensive participants with diabetes, heart failure or chronic kidney disease.;Long Term: Maintenance of blood pressure at goal levels.   Lipids Yes   Intervention Provide education and support for participant on nutrition & aerobic/resistive exercise along with prescribed medications to achieve LDL <7019mHDL >2m15m Expected Outcomes Short Term: Participant states understanding of desired cholesterol values and is compliant with medications prescribed. Participant is following exercise prescription and nutrition guidelines.;Long Term: Cholesterol controlled with medications as prescribed, with individualized exercise RX and with personalized nutrition plan. Value goals: LDL < 70mg98mL > 40 mg.   Stress Yes   Intervention Offer individual and/or small group education and counseling on adjustment to heart disease, stress management and health-related lifestyle change. Teach and support self-help strategies.;Refer participants experiencing significant psychosocial distress to appropriate mental health specialists for further evaluation and treatment. When possible, include family members and significant others in education/counseling sessions.   Expected Outcomes Short Term: Participant demonstrates changes in health-related behavior, relaxation and other stress management skills, ability to obtain effective social support, and compliance with psychotropic medications if prescribed.;Long Term: Emotional wellbeing is indicated by absence of clinically significant psychosocial distress or social isolation.      Core Components/Risk Factors/Patient Goals Review:      Goals and Risk Factor Review    Row Name 09/20/16 0957 10/16/16 1122           Core Components/Risk Factors/Patient Goals Review   Personal Goals Review Weight  Management/Obesity;Hypertension;Lipids;Stress Weight Management/Obesity;Stress      Review Linda Yang's weight has started to come back down.  She has cut back on sugar and eating better. She is now more motivated to get up and get out of the house and be healthier.  Linda Yang's blood pressures have been stable.  She has been doing well on her medications.  She is getting better with her stress levels as well. Linda Yang states she has not started walking at home.  She plans to walk at her grandsons gym when he opens.  Her weight is about the same.  She did meet with the RD and is trying to buy and prepare fresh foods and not boxed foods.  She states she knows what to do and also needs to control portion sizes.  She deals with stress by exercising.  Her main stress has been that her dog is sick and one grandson is going into the [employer].       Expected Outcomes Short: Continue to work on weight loss.  Long: Continue to work on risk factor modification.  Short - Linda Yang will begin adding one day per wek walking in addition to HT.  Long - Linda Yang will continue exercising oon her own and will implement healthier dietary choices.         Core Components/Risk Factors/Patient Goals at Discharge (Final Review):      Goals and Risk Factor Review - 10/16/16 1122      Core Components/Risk Factors/Patient Goals Review   Personal Goals Review Weight Management/Obesity;Stress   Review Linda Yang states she has not started walking at home.  She plans to walk at her grandsons gym when he opens.  Her weight is about the same.  She did meet with the RD and is trying to buy and prepare fresh foods and not boxed foods.  She states she knows what to do and also needs to control portion sizes.  She deals with stress by exercising.  Her main stress has been that her dog is sick and one grandson is going into the [employer].    Expected Outcomes Short - Linda Yang will begin adding one day per wek walking in addition to HT.  Long - Linda Yang will continue  exercising oon her own and will implement healthier dietary choices.      ITP Comments:     ITP Comments    Row Name 08/30/16 1409 09/05/16 0718 09/20/16 0913 10/03/16 0621 10/12/16 1426   ITP Comments Medical review completed Initial ITP created.  Diagnosis documentation can be found [facility] encounter 07/10/2016 30 day review. Continue with ITP unless directed changes per Medical Director review    New to Program (P)  Linda Yang will be scheduling a surgery for her cataracts and possibly for bladder tacking.  She will keep [place] up to date for dates of surgery and what the doctor says.  She may discharge prior to surgery and return once cleared after both surgiers. 30 day review. Continue with ITP unless directed changes per Medical Director review 30 day review.  Linda Yang may have gotten scratched by a bat a home on Monday night.  She has talked with her doctor and the health department about it.  No one had examined her yet, but it was recommended that she be treated for rabies.   [place] Name 10/16/16 1156 10/18/16 1043 10/23/16 1001 10/31/16 1119     ITP Comments Linda Yang had someone come and check their house and no evidence of bats was found.  She has chosen not to go through with rabies vaccination with this information. On the TM at her normal speed Linda Yang began having multiple PVC's resulting in trigeminy. She was asymptomatic but stated she had been feeling more tired than normal at home. Her trigeminy and frequent  PVC's resolved once speed was decreased and did not re-occur during her session. Dr. Etta Quill office was made aware and session info adn strip print out was faxed over to his office. She states she has an appointment with him next week.  Linda Yang had palpitations over the weekend and took a nitro and they went away.  She sees her PCP tomorrow and cardiologist Friday.  Staff recommended she speak with both about the palpitations. 30 day review. Continue with ITP unless directed changes per Medical Director  review.         Comments:

## 2016-11-01 ENCOUNTER — Encounter: Payer: Medicare HMO | Admitting: *Deleted

## 2016-11-01 DIAGNOSIS — R079 Chest pain, unspecified: Secondary | ICD-10-CM | POA: Diagnosis not present

## 2016-11-01 DIAGNOSIS — Z955 Presence of coronary angioplasty implant and graft: Secondary | ICD-10-CM | POA: Diagnosis not present

## 2016-11-01 DIAGNOSIS — I2510 Atherosclerotic heart disease of native coronary artery without angina pectoris: Secondary | ICD-10-CM | POA: Diagnosis not present

## 2016-11-01 NOTE — Progress Notes (Signed)
Daily Session Note  Patient Details  Name: Linda Yang MRN: [id number] Date of Birth: 06-Oct-1940 Referring Provider:     Cardiac Rehab from 08/30/2016 in [facility] Cardiac and Pulmonary Rehab  Referring Provider  [facility]      Encounter Date: 11/01/2016  Check In:     Session Check In - 11/01/16 0821      Check-In   Location [facility]-Cardiac & Pulmonary Rehab   Staff Present Alberteen Sam, MA, ACSM RCEP, Exercise Physiologist;Amanda Oletta Darter, BA, ACSM CEP, Exercise Physiologist;Meredith Sherryll Burger, RN BSN   Supervising physician immediately available to respond to emergencies See telemetry face sheet for immediately available ER MD   Medication changes reported     No   Fall or balance concerns reported    No   Warm-up and Cool-down Performed on first and last piece of equipment   Resistance Training Performed Yes   VAD Patient? No     Pain Assessment   Currently in Pain? No/denies   Multiple Pain Sites No         History  Smoking Status  . Never Smoker  Smokeless Tobacco  . Never Used    Goals Met:  Independence with exercise equipment Exercise tolerated well No report of cardiac concerns or symptoms Strength training completed today  Goals Unmet:  Not Applicable  Comments: Pt able to follow exercise prescription today without complaint.  Will continue to monitor for progression. [name] did reduced workloads today as she has a stress test scheduled this afternoon and still wanted to exercise in class today.    Dr. Emily Filbert is Medical Director for [place] and [facility].

## 2016-11-08 DIAGNOSIS — Z8659 Personal history of other mental and behavioral disorders: Secondary | ICD-10-CM | POA: Diagnosis not present

## 2016-11-08 DIAGNOSIS — I200 Unstable angina: Secondary | ICD-10-CM | POA: Diagnosis not present

## 2016-11-08 DIAGNOSIS — Z955 Presence of coronary angioplasty implant and graft: Secondary | ICD-10-CM

## 2016-11-08 DIAGNOSIS — R0602 Shortness of breath: Secondary | ICD-10-CM | POA: Diagnosis not present

## 2016-11-08 DIAGNOSIS — Z8249 Family history of ischemic heart disease and other diseases of the circulatory system: Secondary | ICD-10-CM | POA: Diagnosis not present

## 2016-11-08 DIAGNOSIS — R002 Palpitations: Secondary | ICD-10-CM | POA: Diagnosis not present

## 2016-11-08 DIAGNOSIS — I208 Other forms of angina pectoris: Secondary | ICD-10-CM | POA: Diagnosis not present

## 2016-11-08 DIAGNOSIS — E669 Obesity, unspecified: Secondary | ICD-10-CM | POA: Diagnosis not present

## 2016-11-08 DIAGNOSIS — I10 Essential (primary) hypertension: Secondary | ICD-10-CM | POA: Diagnosis not present

## 2016-11-08 DIAGNOSIS — I2510 Atherosclerotic heart disease of native coronary artery without angina pectoris: Secondary | ICD-10-CM | POA: Diagnosis not present

## 2016-11-08 NOTE — Progress Notes (Signed)
Daily Session Note  Patient Details  Name: Linda Yang MRN: [id number] Date of Birth: [birth year] Referring Provider:     Cardiac Rehab from 08/30/2016 in [facility] Cardiac and Pulmonary Rehab  Referring Provider  [facility]      Encounter Date: 11/08/2016  Check In:     Session Check In - 11/08/16 0933      Check-In   Location [facility]-Cardiac & Pulmonary Rehab   Staff Present Alberteen Sam, MA, ACSM RCEP, Exercise Physiologist;Shanen Norris Oletta Darter, BA, ACSM CEP, Exercise Physiologist;Meredith Sherryll Burger, RN BSN   Supervising physician immediately available to respond to emergencies See telemetry face sheet for immediately available ER MD   Medication changes reported     No   Fall or balance concerns reported    No   Warm-up and Cool-down Performed on first and last piece of equipment   Resistance Training Performed Yes   VAD Patient? No     Pain Assessment   Currently in Pain? No/denies         History  Smoking Status  . Never Smoker  Smokeless Tobacco  . Never Used    Goals Met:  Independence with exercise equipment Changing diet to healthy choices, watching portion sizes No report of cardiac concerns or symptoms Strength training completed today  Goals Unmet:  Not Applicable  Comments: Pt able to follow exercise prescription today without complaint.  Will continue to monitor for progression.    Dr. Emily Filbert is Medical Director for [place] and [facility].

## 2016-11-12 DIAGNOSIS — E875 Hyperkalemia: Secondary | ICD-10-CM | POA: Diagnosis not present

## 2016-11-14 DIAGNOSIS — R002 Palpitations: Secondary | ICD-10-CM | POA: Diagnosis not present

## 2016-11-14 DIAGNOSIS — R079 Chest pain, unspecified: Secondary | ICD-10-CM | POA: Diagnosis not present

## 2016-11-15 DIAGNOSIS — Z955 Presence of coronary angioplasty implant and graft: Secondary | ICD-10-CM

## 2016-11-15 DIAGNOSIS — R51 Headache: Secondary | ICD-10-CM | POA: Diagnosis not present

## 2016-11-15 NOTE — Progress Notes (Signed)
Daily Session Note  Patient Details  Name: Linda Yang MRN: [id number] Date of Birth: [birth year] Referring Provider:     Cardiac Rehab from 08/30/2016 in [facility] Cardiac and Pulmonary Rehab  Referring Provider  [facility]      Encounter Date: 11/15/2016  Check In:     Session Check In - 11/15/16 0911      Check-In   Location [facility]-Cardiac & Pulmonary Rehab   Staff Present Alberteen Sam, MA, ACSM RCEP, Exercise Physiologist;Daviyon Widmayer Oletta Darter, BA, ACSM CEP, Exercise Physiologist;Meredith Sherryll Burger, RN BSN         History  Smoking Status  . Never Smoker  Smokeless Tobacco  . Never Used    Goals Met:  Independence with exercise equipment Exercise tolerated well No report of cardiac concerns or symptoms Strength training completed today  Goals Unmet:  Not Applicable  Comments: Pt able to follow exercise prescription today without complaint.  Will continue to monitor for progression.    Dr. Emily Filbert is Medical Director for [place] and [facility].

## 2016-11-20 ENCOUNTER — Encounter: Payer: Medicare HMO | Attending: Internal Medicine

## 2016-11-20 DIAGNOSIS — Z955 Presence of coronary angioplasty implant and graft: Secondary | ICD-10-CM | POA: Insufficient documentation

## 2016-11-21 DIAGNOSIS — I2510 Atherosclerotic heart disease of native coronary artery without angina pectoris: Secondary | ICD-10-CM | POA: Diagnosis not present

## 2016-11-21 DIAGNOSIS — I200 Unstable angina: Secondary | ICD-10-CM | POA: Diagnosis not present

## 2016-11-27 ENCOUNTER — Encounter: Payer: Medicare HMO | Admitting: *Deleted

## 2016-11-27 DIAGNOSIS — Z955 Presence of coronary angioplasty implant and graft: Secondary | ICD-10-CM | POA: Diagnosis not present

## 2016-11-27 NOTE — Progress Notes (Signed)
Daily Session Note  Patient Details  Name: Linda Yang MRN: [id number] Date of Birth: Oct 18, 1940 Referring Provider:     Cardiac Rehab from 08/30/2016 in [facility] Cardiac and Pulmonary Rehab  Referring Provider  [facility]      Encounter Date: 11/27/2016  Check In:     Session Check In - 11/27/16 0858      Check-In   Location [facility]-Cardiac & Pulmonary Rehab   Staff Present Gerlene Burdock, RN, BSN;Taytum Scheck Luan Pulling, MA, ACSM RCEP, Exercise Physiologist;Amanda Oletta Darter, BA, ACSM CEP, Exercise Physiologist   Supervising physician immediately available to respond to emergencies See telemetry face sheet for immediately available ER MD   Medication changes reported     No   Fall or balance concerns reported    No   Warm-up and Cool-down Performed on first and last piece of equipment   Resistance Training Performed Yes   VAD Patient? No     Pain Assessment   Currently in Pain? No/denies   Multiple Pain Sites No         History  Smoking Status  . Never Smoker  Smokeless Tobacco  . Never Used    Goals Met:  Independence with exercise equipment Exercise tolerated well No report of cardiac concerns or symptoms Strength training completed today  Goals Unmet:  Not Applicable  Comments: Pt able to follow exercise prescription today without complaint.  Will continue to monitor for progression.    Dr. Emily Filbert is Medical Director for [place] and [facility].

## 2016-11-28 ENCOUNTER — Encounter: Payer: Self-pay | Admitting: *Deleted

## 2016-11-28 NOTE — Progress Notes (Signed)
Cardiac Individual Treatment Plan  Patient Details  Name: Linda Yang MRN: [id number] Date of Birth: 05-27-1940 Referring Provider:     Cardiac Rehab from 08/30/2016 in [facility] Cardiac and Pulmonary Rehab  Referring Provider  [id number]      Initial Encounter Date:    Cardiac Rehab from 08/30/2016 in [facility] Cardiac and Pulmonary Rehab  Date  08/30/16  Referring Provider  [facility]      Visit Diagnosis: No diagnosis found.  Patient's Home Medications on Admission:  Current Outpatient Prescriptions:  .  aspirin EC 81 MG tablet, Take 81 mg by mouth at bedtime., Disp: , Rfl:  .  atorvastatin (LIPITOR) 40 MG tablet, Take by mouth., Disp: , Rfl:  .  Calcium Carbonate-Simethicone (ALKA-SELTZER HEARTBURN + GAS) 750-80 MG CHEW, Chew 1 tablet by mouth daily as needed (heartburn)., Disp: , Rfl:  .  Camphor-Menthol-Capsicum (TIGER BALM PAIN RELIEVING) 80-24-16 MG PTCH, Apply 1-3 patches topically daily as needed (pain)., Disp: , Rfl:  .  cholecalciferol (VITAMIN D) 400 units TABS tablet, Take 400 Units by mouth at bedtime., Disp: , Rfl:  .  ferrous sulfate 325 (65 FE) MG tablet, Take 325 mg by mouth at bedtime., Disp: , Rfl:  .  fexofenadine (ALLEGRA) 180 MG tablet, Take 180 mg by mouth at bedtime as needed for allergies or rhinitis., Disp: , Rfl:  .  HYDROcodone-acetaminophen (NORCO/VICODIN) 5-325 MG tablet, Take 1 tablet by mouth daily as needed for moderate pain., Disp: , Rfl:  .  metoprolol succinate (TOPROL-XL) 25 MG 24 hr tablet, Take by mouth., Disp: , Rfl:  .  nitroGLYCERIN (NITROSTAT) 0.4 MG SL tablet, Place 0.4 mg under the tongue every 5 (five) minutes as needed for chest pain. , Disp: , Rfl:  .  PARoxetine (PAXIL) 20 MG tablet, Take 20 mg by mouth at bedtime., Disp: , Rfl:  .  ticagrelor (BRILINTA) 90 MG TABS tablet, Take 1 tablet (90 mg total) by mouth 2 (two) times daily., Disp: 60 tablet, Rfl: 12 .  trolamine salicylate (ASPERCREME) 10 % cream, Apply 1 application topically as needed for  muscle pain., Disp: , Rfl:  .  vitamin B-12 (CYANOCOBALAMIN) 1000 MCG tablet, Take 1,000 mcg by mouth at bedtime., Disp: , Rfl:  .  Vitamin D, Ergocalciferol, (DRISDOL) 50000 units CAPS capsule, Take 50,000 Units by mouth every Thursday., Disp: , Rfl:   Past Medical History: Past Medical History:  Diagnosis Date  . Coronary artery disease   . High cholesterol     Tobacco Use: History  Smoking Status  . Never Smoker  Smokeless Tobacco  . Never Used    Labs: Recent Review Flowsheet Data    Labs for ITP Cardiac and Pulmonary Rehab Latest Ref Rng & Units 08/05/2009   Cholestrol 0 - 200 mg/dL 236(H)   LDLDIRECT mg/dL 142.2   HDL >39.00 mg/dL 45.90   Trlycerides 0.0 - 149.0 mg/dL 214.0(H)       Exercise Target Goals:    Exercise Program Goal: Individual exercise prescription set with THRR, safety & activity barriers. Participant demonstrates ability to understand and report RPE using BORG scale, to self-measure pulse accurately, and to acknowledge the importance of the exercise prescription.  Exercise Prescription Goal: Starting with aerobic activity 30 plus minutes a day, 3 days per week for initial exercise prescription. Provide home exercise prescription and guidelines that participant acknowledges understanding prior to discharge.  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 Has meds to use for flareups   Cardiac Risk Stratification High      6 Minute Walk:     6 Minute Walk    Row Name 08/30/16 1424 08/30/16 1532       6 Minute Walk   Phase Initial  -    Distance 1430 feet 1430 feet    Walk Time 6 minutes 6 minutes    # of Rest Breaks 0  -    MPH 2.7  -    METS 2.28  -    RPE 17 17    VO2 Peak 7.97  -    Symptoms Yes  (comment) Yes (comment)    Comments sciatic pain 10 sciatic pain 10    Resting HR 61 bpm 61 bpm    Resting BP 124/90 124/90    Max Ex. HR 84 bpm 84 bpm    Max Ex. BP 142/80 142/80    2 Minute Post BP 128/88  -       Oxygen Initial Assessment:   Oxygen Re-Evaluation:   Oxygen Discharge (Final Oxygen Re-Evaluation):   Initial Exercise Prescription:     Initial Exercise Prescription - 08/30/16 1400      Date of Initial Exercise RX and Referring Provider   Date 08/30/16   Referring Provider [facility]     Treadmill   MPH 1.7   Grade 0   Minutes 15   METs 2.3     Recumbant Bike   Level 1   RPM 60   Watts 10   Minutes 15   METs 2.3     NuStep   Level 2   SPM 80   Minutes 15   METs 2.3     Prescription Details   Frequency (times per week) 3   Duration Progress to 45 minutes of aerobic exercise without signs/symptoms of physical distress     Intensity   THRR 40-80% of Max Heartrate 94-128   Ratings of Perceived Exertion 11-13   Perceived Dyspnea 0-4     Resistance Training   Training Prescription Yes   Weight 2   Reps 10-15      Perform Capillary Blood Glucose checks as needed.  Exercise Prescription Changes:     Exercise Prescription Changes    Row Name 08/30/16 1400 09/12/16 1500 09/27/16 1600 10/12/16 1400 10/23/16 1600     Response to Exercise   Blood Pressure (Admit) 124/90 122/60 130/72 122/82 126/60   Blood Pressure (Exercise) 142/80 124/60 126/60 126/72 132/70   Blood Pressure (Exit) 128/88 130/60 112/60 118/66 124/70   Heart Rate (Admit) 61 bpm 69 bpm 76 bpm 79 bpm 71 bpm   Heart Rate (Exercise) 86 bpm 113 bpm 77 bpm 87 bpm 93 bpm   Heart Rate (Exit) 65 bpm 62 bpm 61 bpm 57 bpm 64 bpm   Perceived Dyspnea (Exercise) '17 11 11 13 13   '$ Symptoms  - none none nonw none   Comments  - first full day of exercise  -  -  -   Duration  - Progress to 45 minutes of aerobic exercise without signs/symptoms of physical distress Continue with 45 min of  aerobic exercise without signs/symptoms of physical distress. Continue with 45 min of aerobic exercise without signs/symptoms of physical distress. Continue with 45 min of aerobic exercise without signs/symptoms of physical distress.   Intensity  -  THRR unchanged THRR unchanged THRR unchanged THRR unchanged     Progression   Progression  - Continue to progress workloads to maintain intensity without signs/symptoms of physical distress. Continue to progress workloads to maintain intensity without signs/symptoms of physical distress. Continue to progress workloads to maintain intensity without signs/symptoms of physical distress. Continue to progress workloads to maintain intensity without signs/symptoms of physical distress.   Average METs  - 2 2.3 2.42 2.45     Resistance Training   Training Prescription  - Yes Yes Yes Yes   Weight  - 2 lbs 2 lbs 2 lbs 2 lbs   Reps  - 10-15 10-15 10-15 10-15     Interval Training   Interval Training  - No No No No     Treadmill   MPH  -  - 2 2 2.3   Grade  -  - 0 0 0   Minutes  -  - '15 15 15   '$ METs  -  - 2.53 2.53 2.76     Recumbant Bike   Level  - '3 3 3 3   '$ Watts  - '29 29 25 25   '$ Minutes  - '15 15 15 15   '$ METs  - 3.05 3.05 2.83 2.83     NuStep   Level  - '2 2 2 2   '$ Minutes  - '15 15 15 15   '$ METs  - 1.7 1.5 2 1.8     Home Exercise Plan   Plans to continue exercise at  -  - Home (comment)  walking and going to gym in neighborhood Home (comment)  walking and going to gym in neighborhood Home (comment)  walking and going to gym in neighborhood   Frequency  -  - Add 3 additional days to program exercise sessions. Add 3 additional days to program exercise sessions. Add 3 additional days to program exercise sessions.   Initial Home Exercises Provided  -  - 09/26/16 09/26/16 09/26/16   Row Name 11/06/16 1400 11/23/16 1000           Response to Exercise   Blood Pressure (Admit) 128/68 134/70      Blood Pressure (Exercise) 124/84 138/60      Blood  Pressure (Exit) 122/60 124/60      Heart Rate (Admit) 65 bpm 63 bpm      Heart Rate (Exercise) 83 bpm 111 bpm      Heart Rate (Exit) 62 bpm 70 bpm      Perceived Dyspnea (Exercise) 13 15      Symptoms none none      Duration Continue with 45 min of aerobic exercise without signs/symptoms of physical distress. Continue with 45 min of aerobic exercise without signs/symptoms of physical distress.      Intensity THRR unchanged THRR unchanged        Progression   Progression Continue to progress workloads to maintain intensity without signs/symptoms of physical distress. Continue to progress workloads to maintain intensity without signs/symptoms of physical distress.      Average METs 2.63 2.99        Resistance Training   Training Prescription Yes Yes      Weight 2 lbs 2 lbs      Reps 10-15 10-15        Interval Training   Interval Training No No        Treadmill   MPH 2.3 2.3      Grade 0 0.5  Minutes 15 15      METs 2.76 2.92        Recumbant Bike   Level 3 3      Watts 29 29      Minutes 15 15      METs 3.05 3.05        NuStep   Level 2  -      Minutes 15  -      METs 2.1  -        Home Exercise Plan   Plans to continue exercise at Home (comment)  walking and going to gym in neighborhood Home (comment)  walking and going to gym in neighborhood      Frequency Add 3 additional days to program exercise sessions. Add 3 additional days to program exercise sessions.      Initial Home Exercises Provided 09/26/16 09/26/16         Exercise Comments:     Exercise Comments    Row Name 09/11/16 0915 11/01/16 0935         Exercise Comments First full day of exercise!  Patient was oriented to gym and equipment including functions, settings, policies, and procedures.  Patient's individual exercise prescription and treatment plan were reviewed.  All starting workloads were established based on the results of the 6 minute walk test done at initial orientation visit.  The plan  for exercise progression was also introduced and progression will be customized based on patient's performance and goals. [name] did reduced workloads today as she has a stress test scheduled this afternoon and still wanted to exercise in class today.          Exercise Goals and Review:     Exercise Goals    Row Name 08/30/16 1423             Exercise Goals   Increase Physical Activity Yes       Intervention Provide advice, education, support and counseling about physical activity/exercise needs.;Develop an individualized exercise prescription for aerobic and resistive training based on initial evaluation findings, risk stratification, comorbidities and participant's personal goals.       Expected Outcomes Achievement of increased cardiorespiratory fitness and enhanced flexibility, muscular endurance and strength shown through measurements of functional capacity and personal statement of participant.       Increase Strength and Stamina Yes       Intervention Provide advice, education, support and counseling about physical activity/exercise needs.;Develop an individualized exercise prescription for aerobic and resistive training based on initial evaluation findings, risk stratification, comorbidities and participant's personal goals.       Expected Outcomes Achievement of increased cardiorespiratory fitness and enhanced flexibility, muscular endurance and strength shown through measurements of functional capacity and personal statement of participant.          Exercise Goals Re-Evaluation :     Exercise Goals Re-Evaluation    Row Name 09/12/16 1458 09/20/16 0911 09/25/16 1019 09/27/16 1601 10/12/16 1426     Exercise Goal Re-Evaluation   Exercise Goals Review Increase Physical Activity;Increase Strenth and Stamina Increase Physical Activity;Increase Strenth and Stamina Increase Physical Activity;Increase Strenth and Stamina Increase Physical Activity;Increase Strenth and Stamina Increase  Physical Activity;Increase Strenth and Stamina   Comments Lakin has completed her first full day of exercise. She did well and we will continue to monitor her progression.  [name] has been doing well in rehab.  She is feeling stronger and has more energy.  She has already started to notice a  diffence in how she is feeling.  It makes her want to get up and get out to do more to feel better. Reviewed home exercise with pt today.  Pt plans to walk and go to gym at home for exercise.  Reviewed THR, pulse, RPE, sign and symptoms, and when to call 911 or MD.  Also discussed weather considerations and indoor options.  Pt voiced understanding. [name] has been doing well in rehab.  She is up to 29 watts on the recumbent bike.  Her attendance will become sporadiac with upcoming potential surgeries.  She is going to keep [place] up to date with everything.  We will continue to monitor her progression.  [name] has been doing well in rehab.  She is now up to level 3 on the bike!  We will continue to monitor her progression.    Expected Outcomes Short: Larah will continue to attend classes regularly.  Long: Increase physical activity.  Short: Review home exercise. Long: Continue to exercise regularly. Short: Add in home exercise.  Long: Exercise regulary here and at home. Short: Start walking at home.  Long: Make exercise part of routine.  Short: Walk more at home.  Long: Continue to build strength and stamina.    [place] Name 10/23/16 1642 11/06/16 1414 11/08/16 1126 11/23/16 1036       Exercise Goal Re-Evaluation   Exercise Goals Review Increase Physical Activity;Increase Strength and Stamina Increase Physical Activity;Increase Strength and Stamina Increase Physical Activity;Increase Strength and Stamina;Understanding of Exercise Prescription Increase Physical Activity;Increase Strength and Stamina    Comments [name] continues to do well in rehab.  She is now up to 2.3 mph on the treadmill. We will continue to monitor her  progression.  [name] has continued to do well in rehab.  She is up to 29 Watts on the recumbent bike.  We will continue to monitor her progress Shauna Hugh has been doing well.  She has been going to the gym twice a week.  Her strength and stamina are getting better.  She is just very busy.  She had her stress test last week but did not last long enough for it to be diagnostic.  She likes coming to rehab as it makes her feel safe. [name] has been doing well in rehab. She had appointments keeping her out this week.  She is up to 2.3 mph and 0.5% grade on the treadmill. We will continue to monitor her progression.     Expected Outcomes Short: Increase workloads.  Long: Exercise more independently. Short: Discuss adding in intervals.  Long: Continue to build strenght and stamina.  Short: Discuss adding in intervals.  Long: Continue to build strenght and stamina.  Short: Try to increase more workloads.  Long: Continue to increase physical activity.        Discharge Exercise Prescription (Final Exercise Prescription Changes):     Exercise Prescription Changes - 11/23/16 1000      Response to Exercise   Blood Pressure (Admit) 134/70   Blood Pressure (Exercise) 138/60   Blood Pressure (Exit) 124/60   Heart Rate (Admit) 63 bpm   Heart Rate (Exercise) 111 bpm   Heart Rate (Exit) 70 bpm   Perceived Dyspnea (Exercise) 15   Symptoms none   Duration Continue with 45 min of aerobic exercise without signs/symptoms of physical distress.   Intensity THRR unchanged     Progression   Progression Continue to progress workloads to maintain intensity without signs/symptoms of physical distress.   Average METs 2.99  Resistance Training   Training Prescription Yes   Weight 2 lbs   Reps 10-15     Interval Training   Interval Training No     Treadmill   MPH 2.3   Grade 0.5   Minutes 15   METs 2.92     Recumbant Bike   Level 3   Watts 29   Minutes 15   METs 3.05     Home Exercise Plan   Plans to  continue exercise at Home (comment)  walking and going to gym in neighborhood   Frequency Add 3 additional days to program exercise sessions.   Initial Home Exercises Provided 09/26/16      Nutrition:  Target Goals: Understanding of nutrition guidelines, daily intake of sodium '1500mg'$ , cholesterol '200mg'$ , calories 30% from fat and 7% or less from saturated fats, daily to have 5 or more servings of fruits and vegetables.  Biometrics:     Pre Biometrics - 08/30/16 1422      Pre Biometrics   Height 5' 3.5" (1.613 m)   Weight 203 lb 12.8 oz (92.4 kg)   Waist Circumference 43.5 inches   Hip Circumference 51 inches   Waist to Hip Ratio 0.85 %   BMI (Calculated) 35.6   Single Leg Stand 6.36 seconds       Nutrition Therapy Plan and Nutrition Goals:     Nutrition Therapy & Goals - 09/25/16 1155      Nutrition Therapy   Diet TLC   Drug/Food Interactions Statins/Certain Fruits   Protein (specify units) 7oz   Fiber 20 grams   Fruits and Vegetables 5 servings/day   Sodium 1500 grams     Personal Nutrition Goals   Personal Goal #2 Lose at least 10lbs -- by continuing to work on portion control, especially starches   Personal Goal #3 track food intake using free phone app   Comments Ms. Portillo and her husband have both been making an effort to improve diet with lower fat and lower carb intake. She states limiting portions is the biggest challenge for her, but does want to continue with this goal.      Intervention Plan   Intervention Nutrition handout(s) given to patient.      Nutrition Discharge: Rate Your Plate Scores:     Nutrition Assessments - 08/30/16 1417      MEDFICTS Scores   Pre Score 80      Nutrition Goals Re-Evaluation:     Nutrition Goals Re-Evaluation    Friend Name 09/20/16 1009 11/08/16 1134           Goals   Current Weight 205 lb 14.4 oz (93.4 kg)  -      Nutrition Goal meet with dietician Lose 10lbs work on portion control, watch starches, track  on phone.      Comment [name] has an appointment scheduled to meet with dietician on Aug 7 after class.  She would like help to guide her towards a healthy diet and continue to lose weight. [name] still needs to work on portion control.  Eat a piece of cake not half the cake.  She is not tracking her food intake as she doesn't feel like she has enough time to do that too. She admitted to needing to coordinate eating and exercise better.  She is doing better overall, but still has some slip ups.       Expected Outcome Meet with dietician Short: Work on portion control.  Long: Coordinate eating and exercise.  Nutrition Goals Discharge (Final Nutrition Goals Re-Evaluation):     Nutrition Goals Re-Evaluation - 11/08/16 1134      Goals   Nutrition Goal Lose 10lbs work on portion control, watch starches, track on phone.   Comment [name] still needs to work on portion control.  Eat a piece of cake not half the cake.  She is not tracking her food intake as she doesn't feel like she has enough time to do that too. She admitted to needing to coordinate eating and exercise better.  She is doing better overall, but still has some slip ups.    Expected Outcome Short: Work on portion control.  Long: Coordinate eating and exercise.       Psychosocial: Target Goals: Acknowledge presence or absence of significant depression and/or stress, maximize coping skills, provide positive support system. Participant is able to verbalize types and ability to use techniques and skills needed for reducing stress and depression.   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 Has been on meds in past.  Stated that she handles it on her own without medication.       Quality of Life Scores:      Quality of Life - 08/30/16 1424      Quality of Life Scores    Health/Function Pre 23.57 %   Socioeconomic Pre 17.79 %   Psych/Spiritual Pre 23.14 %   Family Pre 27 %   GLOBAL Pre 22.58 %      PHQ-9: Recent Review Flowsheet Data    Depression screen [facility] 2/9 08/30/2016   Decreased Interest 0   Down, Depressed, Hopeless 0   PHQ - 2 Score 0   Altered sleeping 1   Tired, decreased energy 1   Change in appetite 2   Feeling bad or failure about yourself  0   Trouble concentrating 2   Moving slowly or fidgety/restless 2   Suicidal thoughts 0   PHQ-9 Score 8   Difficult doing work/chores Somewhat difficult     Interpretation of Total Score  Total Score Depression Severity:  1-4 = Minimal depression, 5-9 = Mild depression, 10-14 = Moderate depression, 15-19 = Moderately severe depression, 20-27 = Severe depression   Psychosocial Evaluation and Intervention:     Psychosocial Evaluation - 09/25/16 0954      Psychosocial Evaluation & Interventions   Interventions Encouraged to exercise with the program and follow exercise prescription;Relaxation education;Stress management education   Comments Counselor met with Ms. Lia Hopping ([name]) today for initial psychosocial evaluation.  She is a 76 year old who had a stent inserted on 5/30.  [name] has a strong support system with a spouse of 13 years and several daughters who live locally.  [name] is also actively involved in her local church.  She reports chronic sleep problems not being able to go to sleep.  She also states she has a history of depression and anxiety and takes medication for this which is helpful.  [name] has multiple stressors in her life with relationship problems; her own health; finances and her current living conditions.  [name] has goals to be healthier overall and "take charge of my life and better care of myself."  Counselor encouraged [name] to speak with her doctor or pharmacist about her sleep problems and we developed a sleep  plan for her which includes limited caffeine and afternoon naps to  see if this helps.  Counselor and staff will follow with [name] throughout the course of this program.    Expected Outcomes [name] will benefit from consistent exercise to achieve her stated goals.  The educational and psychoeducational components of this program will be helpful in managing her stress and illness better.     Continue Psychosocial Services  Follow up required by staff      Psychosocial Re-Evaluation:     Psychosocial Re-Evaluation    [place] Name 09/20/16 1011 11/01/16 0944 11/08/16 1136         Psychosocial Re-Evaluation   Current issues with Current Stress Concerns  - Current Stress Concerns     Comments [name] states that her stress has gotten better since starting the program.  She has made the decision to just let things go and determine how important things really are.  Her new mantra: "Will it still be important a year from now?" Rehab hasgiven her more motivation to get out and about and to make herself better.  Counselor follow up with [name] today reporting she is having a stress test today so is taking it easy in class.  She reports sleeping well and having more energy since coming into this class.  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  Counselor discussed ways to approach this and getting out daily for a few minutes no matter what is one of the ways.  Also speaking to her Dr. about either light therapy or possibly an SSRI as needed.  [name] agreed to do so in order to improve her quality of life.   [name] did not last long enough on her stress test to make it diagnostic.  She is stressed by her busy schedule.  She is having a hard time saying no to others.  I gave her the handout from the stress class again.  She feels like she needs a break and plans to take a vacation after rehab is over.  She is upset by her messy house from their multiple moves in the last few years.  She even admitted  to dreading getting up first thing in the morning, but then once she is up she is ready to take on her tasks for the day!     Expected Outcomes Short: Continue to have a positive attitude and cope with stress.  Long: Continue to work on coping with stress positively. [name] will check with her Dr. about light therapy or possibly an SSRI to help her through her Seasonal Affective disorder for 6 months every year.  Staff will follow with her.  Short: Work on saying no.  Long: Talk to doctor about SSRI per [facility] suggestion.      Interventions Encouraged to attend Cardiac Rehabilitation for the exercise;Stress management education Stress management education Encouraged to attend Cardiac Rehabilitation for the exercise;Stress management education     Continue Psychosocial Services  Follow up required by staff Follow up required by staff Follow up required by staff       Initial Review   Source of Stress Concerns Chronic Illness;Family;Financial  - Chronic Illness;Family;Financial        Psychosocial Discharge (Final Psychosocial Re-Evaluation):     Psychosocial Re-Evaluation - 11/08/16 1136      Psychosocial Re-Evaluation   Current issues with Current Stress Concerns   Comments [name] did  not last long enough on her stress test to make it diagnostic.  She is stressed by her busy schedule.  She is having a hard time saying no to others.  I gave her the handout from the stress class again.  She feels like she needs a break and plans to take a vacation after rehab is over.  She is upset by her messy house from their multiple moves in the last few years.  She even admitted to dreading getting up first thing in the morning, but then once she is up she is ready to take on her tasks for the day!   Expected Outcomes Short: Work on saying no.  Long: Talk to doctor about SSRI per [facility] suggestion.    Interventions Encouraged to attend Cardiac Rehabilitation for the exercise;Stress management education    Continue Psychosocial Services  Follow up required by staff     Initial Review   Source of Stress Concerns Chronic Illness;Family;Financial      Vocational Rehabilitation: Provide vocational rehab assistance to qualifying candidates.   Vocational Rehab Evaluation & Intervention:     Vocational Rehab - 08/30/16 1426      Initial Vocational Rehab Evaluation & Intervention   Assessment shows need for Vocational Rehabilitation No      Education: Education Goals: Education classes will be provided on a variety of topics geared toward better understanding of heart health and risk factor modification. Participant will state understanding/return demonstration of topics presented as noted by education test scores.  Learning Barriers/Preferences:     Learning Barriers/Preferences - 08/30/16 1426      Learning Barriers/Preferences   Learning Barriers Hearing   Learning Preferences Individual Instruction      Education Topics: General Nutrition Guidelines/Fats and Fiber: -Group instruction provided by verbal, written material, models and posters to present the general guidelines for heart healthy nutrition. Gives an explanation and review of dietary fats and fiber.   Controlling Sodium/Reading Food Labels: -Group verbal and written material supporting the discussion of sodium use in heart healthy nutrition. Review and explanation with models, verbal and written materials for utilization of the food label.   Cardiac Rehab from 11/27/2016 in [facility] Cardiac and Pulmonary Rehab  Date  10/16/16  Educator  PI  Instruction Review Code  1- Verbalizes Understanding      Exercise Physiology & Risk Factors: - Group verbal and written instruction with models to review the exercise physiology of the cardiovascular system and associated critical values. Details cardiovascular disease risk factors and the goals associated with each risk factor.   Cardiac Rehab from 11/27/2016 in [facility] Cardiac and  Pulmonary Rehab  Date  10/25/16  Educator  [facility]  Instruction Review Code  1- Verbalizes Understanding      Aerobic Exercise & Resistance Training: - Gives group verbal and written discussion on the health impact of inactivity. On the components of aerobic and resistive training programs and the benefits of this training and how to safely progress through these programs.   Flexibility, Balance, General Exercise Guidelines: - Provides group verbal and written instruction on the benefits of flexibility and balance training programs. Provides general exercise guidelines with specific guidelines to those with heart or lung disease. Demonstration and skill practice provided.   Cardiac Rehab from 11/27/2016 in [facility] Cardiac and Pulmonary Rehab  Date  11/01/16  Educator  AS  Instruction Review Code  1- Verbalizes Understanding      Stress Management: - Provides group verbal and written instruction about the health  risks of elevated stress, cause of high stress, and healthy ways to reduce stress.   Cardiac Rehab from 11/27/2016 in [facility] Cardiac and Pulmonary Rehab  Date  09/11/16  Educator  [place]      Depression: - Provides group verbal and written instruction on the correlation between heart/lung disease and depressed mood, treatment options, and the stigmas associated with seeking treatment.   Cardiac Rehab from 11/27/2016 in [facility] Cardiac and Pulmonary Rehab  Date  11/27/16  Educator  [facility]  Instruction Review Code (retired)  2- meets goals/outcomes  Instruction Review Code  1- Actuary & Physiology of the Heart: - Group verbal and written instruction and models provide basic cardiac anatomy and physiology, with the coronary electrical and arterial systems. Review of: AMI, Angina, Valve disease, Heart Failure, Cardiac Arrhythmia, Pacemakers, and the ICD.   Cardiac Rehab from 11/27/2016 in [facility] Cardiac and Pulmonary Rehab  Date  11/08/16  Educator  [facility]  Instruction  Review Code  1- Verbalizes Understanding      Cardiac Procedures: - Group verbal and written instruction to review commonly prescribed medications for heart disease. Reviews the medication, class of the drug, and side effects. Includes the steps to properly store meds and maintain the prescription regimen. (beta blockers and nitrates)   Cardiac Medications I: - Group verbal and written instruction to review commonly prescribed medications for heart disease. Reviews the medication, class of the drug, and side effects. Includes the steps to properly store meds and maintain the prescription regimen.   Cardiac Rehab from 11/27/2016 in [facility] Cardiac and Pulmonary Rehab  Date  09/25/16 [8/7 Part One]  Educator  SB      Cardiac Medications II: -Group verbal and written instruction to review commonly prescribed medications for heart disease. Reviews the medication, class of the drug, and side effects. (all other drug classes)    Go Sex-Intimacy & Heart Disease, Get SMART - Goal Setting: - Group verbal and written instruction through game format to discuss heart disease and the return to sexual intimacy. Provides group verbal and written material to discuss and apply goal setting through the application of the S.M.A.R.T. Method.   Other Matters of the Heart: - Provides group verbal, written materials and models to describe Heart Failure, Angina, Valve Disease, Peripheral Artery Disease, and Diabetes in the realm of heart disease. Includes description of the disease process and treatment options available to the cardiac patient.   Cardiac Rehab from 11/27/2016 in [facility] Cardiac and Pulmonary Rehab  Date  11/08/16  Educator  [facility]  Instruction Review Code  1- Verbalizes Understanding      Exercise & Equipment Safety: - Individual verbal instruction and demonstration of equipment use and safety with use of the equipment.   Cardiac Rehab from 11/27/2016 in [facility] Cardiac and Pulmonary Rehab  Date   08/30/16  Educator  SB      Infection Prevention: - Provides verbal and written material to individual with discussion of infection control including proper hand washing and proper equipment cleaning during exercise session.   Cardiac Rehab from 11/27/2016 in [facility] Cardiac and Pulmonary Rehab  Date  08/30/16  Educator  SB      Falls Prevention: - Provides verbal and written material to individual with discussion of falls prevention and safety.   Cardiac Rehab from 11/27/2016 in [facility] Cardiac and Pulmonary Rehab  Date  08/30/16  Educator  Sb  Instruction Review Code (retired)  2- meets goals/outcomes  Diabetes: - Individual verbal and written instruction to review signs/symptoms of diabetes, desired ranges of glucose level fasting, after meals and with exercise. Acknowledge that pre and post exercise glucose checks will be done for 3 sessions at entry of program.   Other: -Provides group and verbal instruction on various topics (see comments)    Knowledge Questionnaire Score:     Knowledge Questionnaire Score - 08/30/16 1426      Knowledge Questionnaire Score   Pre Score 20/28      Core Components/Risk Factors/Patient Goals at Admission:     Personal Goals and Risk Factors at Admission - 08/30/16 1429      Core Components/Risk Factors/Patient Goals on Admission   Hypertension Yes   Intervention Provide education on lifestyle modifcations including regular physical activity/exercise, weight management, moderate sodium restriction and increased consumption of fresh fruit, vegetables, and low fat dairy, alcohol moderation, and smoking cessation.;Monitor prescription use compliance.   Expected Outcomes Short Term: Continued assessment and intervention until BP is < 140/62m HG in hypertensive participants. < 130/810mHG in hypertensive participants with diabetes, heart failure or chronic kidney disease.;Long Term: Maintenance of blood pressure at goal levels.   Lipids Yes    Intervention Provide education and support for participant on nutrition & aerobic/resistive exercise along with prescribed medications to achieve LDL '70mg'$ , HDL >'40mg'$ .   Expected Outcomes Short Term: Participant states understanding of desired cholesterol values and is compliant with medications prescribed. Participant is following exercise prescription and nutrition guidelines.;Long Term: Cholesterol controlled with medications as prescribed, with individualized exercise RX and with personalized nutrition plan. Value goals: LDL < '70mg'$ , HDL > 40 mg.   Stress Yes   Intervention Offer individual and/or small group education and counseling on adjustment to heart disease, stress management and health-related lifestyle change. Teach and support self-help strategies.;Refer participants experiencing significant psychosocial distress to appropriate mental health specialists for further evaluation and treatment. When possible, include family members and significant others in education/counseling sessions.   Expected Outcomes Short Term: Participant demonstrates changes in health-related behavior, relaxation and other stress management skills, ability to obtain effective social support, and compliance with psychotropic medications if prescribed.;Long Term: Emotional wellbeing is indicated by absence of clinically significant psychosocial distress or social isolation.      Core Components/Risk Factors/Patient Goals Review:      Goals and Risk Factor Review    Row Name 09/20/16 0957 10/16/16 1122 11/08/16 1128         Core Components/Risk Factors/Patient Goals Review   Personal Goals Review Weight Management/Obesity;Hypertension;Lipids;Stress Weight Management/Obesity;Stress Weight Management/Obesity;Hypertension;Lipids     Review [name]'s weight has started to come back down.  She has cut back on sugar and eating better. She is now more motivated to get up and get out of the house and be healthier.  [name]'s  blood pressures have been stable.  She has been doing well on her medications.  She is getting better with her stress levels as well. [name] states she has not started walking at home.  She plans to walk at her grandsons gym when he opens.  Her weight is about the same.  She did meet with the RD and is trying to buy and prepare fresh foods and not boxed foods.  She states she knows what to do and also needs to control portion sizes.  She deals with stress by exercising.  Her main stress has been that her dog is sick and one grandson is going into the [employer] [name] is going  to the gym twice a week.  Her weight was up today as she had chocolate cake for dessert.  She knows she needs to watch her portions and make smart decisions.  Her blood pressures have been good.  Her meds are working for her.     Expected Outcomes Short: Continue to work on weight loss.  Long: Continue to work on risk factor modification.  Short - [name] will begin adding one day per wek walking in addition to HT.  Long - [name] will continue exercising oon her own and will implement healthier dietary choices. Short: [name] will continue to work on weight loss.  Long: Continue to make healthier dietary choices.         Core Components/Risk Factors/Patient Goals at Discharge (Final Review):      Goals and Risk Factor Review - 11/08/16 1128      Core Components/Risk Factors/Patient Goals Review   Personal Goals Review Weight Management/Obesity;Hypertension;Lipids   Review [name] is going to the gym twice a week.  Her weight was up today as she had chocolate cake for dessert.  She knows she needs to watch her portions and make smart decisions.  Her blood pressures have been good.  Her meds are working for her.   Expected Outcomes Short: [name] will continue to work on weight loss.  Long: Continue to make healthier dietary choices.       ITP Comments:     ITP Comments    Row Name 08/30/16 1409 09/05/16 0718 09/20/16 0913 10/03/16  0621 10/12/16 1426   ITP Comments Medical review completed Initial ITP created.  Diagnosis documentation can be found [facility] encounter 07/10/2016 30 day review. Continue with ITP unless directed changes per Medical Director review    New to Program (P)  [name] will be scheduling a surgery for her cataracts and possibly for bladder tacking.  She will keep [place] up to date for dates of surgery and what the doctor says.  She may discharge prior to surgery and return once cleared after both surgiers. 30 day review. Continue with ITP unless directed changes per Medical Director review 30 day review.  [name] may have gotten scratched by a bat a home on Monday night.  She has talked with her doctor and the health department about it.  No one had examined her yet, but it was recommended that she be treated for rabies.   [place] Name 10/16/16 1156 10/18/16 1043 10/23/16 1001 10/31/16 1119 11/28/16 0644   ITP Comments [name] had someone come and check their house and no evidence of bats was found.  She has chosen not to go through with rabies vaccination with this information. On the TM at her normal speed [name] began having multiple PVC's resulting in trigeminy. She was asymptomatic but stated she had been feeling more tired than normal at home. Her trigeminy and frequent PVC's resolved once speed was decreased and did not re-occur during her session. Dr. Etta Quill office was made aware and session info adn strip print out was faxed over to his office. She states she has an appointment with him next week.  [name] had palpitations over the weekend and took a nitro and they went away.  She sees her PCP tomorrow and cardiologist Friday.  Staff recommended she speak with both about the palpitations. 30 day review. Continue with ITP unless directed changes per Medical Director review.   30 day Review. Continue with ITP unless directed changes per Medical Director Review.  Comments:

## 2016-11-29 ENCOUNTER — Encounter: Payer: Medicare HMO | Admitting: *Deleted

## 2016-11-29 DIAGNOSIS — Z955 Presence of coronary angioplasty implant and graft: Secondary | ICD-10-CM | POA: Diagnosis not present

## 2016-11-29 NOTE — Progress Notes (Signed)
Daily Session Note  Patient Details  Name: Linda Yang MRN: [id number] Date of Birth: [birth year] Referring Provider:     Cardiac Rehab from 08/30/2016 in [facility] Cardiac and Pulmonary Rehab  Referring Provider  [facility]      Encounter Date: 11/29/2016  Check In:     Session Check In - 11/29/16 0839      Check-In   Location [facility]-Cardiac & Pulmonary Rehab   Staff Present Alberteen Sam, MA, ACSM RCEP, Exercise Physiologist;Amanda Oletta Darter, BA, ACSM CEP, Exercise Physiologist;Meredith Sherryll Burger, RN BSN   Supervising physician immediately available to respond to emergencies See telemetry face sheet for immediately available ER MD   Medication changes reported     No   Fall or balance concerns reported    No   Warm-up and Cool-down Performed on first and last piece of equipment   Resistance Training Performed Yes   VAD Patient? No     Pain Assessment   Currently in Pain? No/denies         History  Smoking Status  . Never Smoker  Smokeless Tobacco  . Never Used    Goals Met:  Independence with exercise equipment Exercise tolerated well No report of cardiac concerns or symptoms Strength training completed today  Goals Unmet:  Not Applicable  Comments: Pt able to follow exercise prescription today without complaint.  Will continue to monitor for progression.    Dr. Emily Filbert is Medical Director for [place] and [facility].

## 2016-11-29 NOTE — Progress Notes (Deleted)
Daily Session Note  Patient Details  Name: BONNI NEUSER MRN: [id number] Date of Birth: Dec 02, 1940 Referring Provider:     Cardiac Rehab from 08/30/2016 in [facility] Cardiac and Pulmonary Rehab  Referring Provider  [facility]      Encounter Date: 11/29/2016  Check In:     Session Check In - 11/29/16 0839      Check-In   Location [facility]-Cardiac & Pulmonary Rehab   Staff Present Alberteen Sam, MA, ACSM RCEP, Exercise Physiologist;Amanda Oletta Darter, BA, ACSM CEP, Exercise Physiologist;Meredith Sherryll Burger, RN BSN   Supervising physician immediately available to respond to emergencies See telemetry face sheet for immediately available ER MD   Medication changes reported     No   Fall or balance concerns reported    No   Warm-up and Cool-down Performed on first and last piece of equipment   Resistance Training Performed Yes   VAD Patient? No     Pain Assessment   Currently in Pain? No/denies         History  Smoking Status  . Never Smoker  Smokeless Tobacco  . Never Used    Goals Met:  Independence with exercise equipment Exercise tolerated well No report of cardiac concerns or symptoms Strength training completed today  Goals Unmet:    Comments: Pt able to follow exercise prescription today without complaint.  Will continue to monitor for progression.   Dr. Emily Filbert is Medical Director for [place] and [facility].

## 2016-12-04 ENCOUNTER — Encounter: Payer: Medicare HMO | Admitting: *Deleted

## 2016-12-04 DIAGNOSIS — Z955 Presence of coronary angioplasty implant and graft: Secondary | ICD-10-CM

## 2016-12-04 NOTE — Progress Notes (Signed)
Daily Session Note  Patient Details  Name: SINDY MCCUNE MRN: [id number] Date of Birth: [birth year] Referring Provider:     Cardiac Rehab from 08/30/2016 in [facility] Cardiac and Pulmonary Rehab  Referring Provider  [facility]      Encounter Date: 12/04/2016  Check In:     Session Check In - 12/04/16 0852      Check-In   Location [facility]-Cardiac & Pulmonary Rehab   Staff Present Renita Papa, RN BSN;Joseph Hood RCP,RRT,BSRT;[name] RN,BSN   Supervising physician immediately available to respond to emergencies See telemetry face sheet for immediately available ER MD   Medication changes reported     No   Fall or balance concerns reported    No   Warm-up and Cool-down Performed on first and last piece of equipment   Resistance Training Performed Yes   VAD Patient? No     Pain Assessment   Currently in Pain? No/denies         History  Smoking Status  . Never Smoker  Smokeless Tobacco  . Never Used    Goals Met:  Independence with exercise equipment Exercise tolerated well No report of cardiac concerns or symptoms Strength training completed today  Goals Unmet:  Not Applicable  Comments: Pt able to follow exercise prescription today without complaint.  Will continue to monitor for progression.    Dr. Emily Filbert is Medical Director for [place] and [facility].

## 2016-12-06 DIAGNOSIS — Z955 Presence of coronary angioplasty implant and graft: Secondary | ICD-10-CM | POA: Diagnosis not present

## 2016-12-06 NOTE — Progress Notes (Signed)
Daily Session Note  Patient Details  Name: Linda Yang MRN: [id number] Date of Birth: Jun 12, 1940 Referring Provider:     Cardiac Rehab from 08/30/2016 in [facility] Cardiac and Pulmonary Rehab  Referring Provider  [facility]      Encounter Date: 12/06/2016  Check In:     Session Check In - 12/06/16 1615      Check-In   Location [facility]-Cardiac & Pulmonary Rehab   Staff Present Gerlene Burdock, RN, Moises Blood, BS, ACSM CEP, Exercise Physiologist;Joseph Flavia Shipper   Supervising physician immediately available to respond to emergencies See telemetry face sheet for immediately available ER MD   Medication changes reported     No   Fall or balance concerns reported    No   Warm-up and Cool-down Performed on first and last piece of equipment   Resistance Training Performed Yes   VAD Patient? No     Pain Assessment   Currently in Pain? No/denies   Multiple Pain Sites No         History  Smoking Status  . Never Smoker  Smokeless Tobacco  . Never Used    Goals Met:  Independence with exercise equipment Exercise tolerated well No report of cardiac concerns or symptoms Strength training completed today  Goals Unmet:  Not Applicable  Comments: Pt able to follow exercise prescription today without complaint.  Will continue to monitor for progression.   Dr. Emily Filbert is Medical Director for [place] and [facility].

## 2016-12-11 VITALS — Ht 63.5 in | Wt 205.0 lb

## 2016-12-11 DIAGNOSIS — Z955 Presence of coronary angioplasty implant and graft: Secondary | ICD-10-CM | POA: Diagnosis not present

## 2016-12-11 NOTE — Progress Notes (Signed)
Daily Session Note  Patient Details  Name: Linda Yang MRN: [id number] Date of Birth: [birth year] Referring Provider:     Cardiac Rehab from 08/30/2016 in [facility] Cardiac and Pulmonary Rehab  Referring Provider  [facility]      Encounter Date: 12/11/2016  Check In:     Session Check In - 12/11/16 0836      Check-In   Location [facility]-Cardiac & Pulmonary Rehab   Staff Present Heath Lark, RN, BSN, CCRP;Jessica Superior, MA, ACSM RCEP, Exercise Physiologist;Rosealee Recinos Oletta Darter, [place], ACSM CEP, Exercise Physiologist   Supervising physician immediately available to respond to emergencies See telemetry face sheet for immediately available ER MD   Medication changes reported     No   Fall or balance concerns reported    No   Warm-up and Cool-down Performed on first and last piece of equipment   Resistance Training Performed Yes   VAD Patient? No     Pain Assessment   Currently in Pain? No/denies         History  Smoking Status  . Never Smoker  Smokeless Tobacco  . Never Used    Goals Met:  Independence with exercise equipment Exercise tolerated well No report of cardiac concerns or symptoms Strength training completed today  Goals Unmet:  Not Applicable  Comments:      6 Minute Walk    Row Name 08/30/16 1424 08/30/16 1532 12/11/16 0954     6 Minute Walk   Phase Initial  - Discharge   Distance 1430 feet 1430 feet 1715 feet   Distance % Change  -  - 20 %   Distance Feet Change  -  - 285 ft   Walk Time 6 minutes 6 minutes 6 minutes   # of Rest Breaks 0  - 0   MPH 2.7  - 3.25   METS 2.28  - 3.09   RPE _0 Perceived Dyspnea   -  - 2   VO2 Peak 7.97  - 10.84   Symptoms Yes (comment) Yes (comment) Yes (comment)   Comments sciatic pain 10 sciatic pain 10 sciatica 3/10   Resting HR 61 bpm 61 bpm 67 bpm   Resting BP 124/90 124/90 138/60   Resting Oxygen Saturation   -  - 96 %   Max Ex. HR 84 bpm 84 bpm 108 bpm   Max Ex. BP 142/80 142/80 152/64   2 Minute Post BP  128/88  -  -        Dr. Emily Filbert is Medical Director for [place] and [facility].

## 2016-12-13 ENCOUNTER — Encounter: Payer: Medicare HMO | Admitting: *Deleted

## 2016-12-13 DIAGNOSIS — Z955 Presence of coronary angioplasty implant and graft: Secondary | ICD-10-CM

## 2016-12-13 NOTE — Progress Notes (Signed)
Daily Session Note  Patient Details  Name: ROSSLYN PASION MRN: [id number] Date of Birth: [birth year] Referring Provider:     Cardiac Rehab from 08/30/2016 in [facility] Cardiac and Pulmonary Rehab  Referring Provider  [facility]      Encounter Date: 12/13/2016  Check In:     Session Check In - 12/13/16 0931      Check-In   Location [facility]-Cardiac & Pulmonary Rehab   Staff Present Alberteen Sam, MA, ACSM RCEP, Exercise Physiologist;Amanda Oletta Darter, BA, ACSM CEP, Exercise Physiologist;Meredith Sherryll Burger, RN BSN   Supervising physician immediately available to respond to emergencies See telemetry face sheet for immediately available ER MD   Medication changes reported     No   Fall or balance concerns reported    No   Warm-up and Cool-down Performed on first and last piece of equipment   Resistance Training Performed Yes   VAD Patient? No     Pain Assessment   Currently in Pain? No/denies         History  Smoking Status  . Never Smoker  Smokeless Tobacco  . Never Used    Goals Met:  Independence with exercise equipment Exercise tolerated well No report of cardiac concerns or symptoms Strength training completed today  Goals Unmet:  Not Applicable  Comments: Pt able to follow exercise prescription today without complaint.  Will continue to monitor for progression.    Dr. Emily Filbert is Medical Director for [place] and [facility].

## 2016-12-13 NOTE — Patient Instructions (Signed)
Discharge Instructions  Patient Details  Name: Linda Yang MRN: [id number] Date of Birth: 06/02/1940 Referring Provider:  Yolonda Kida, MD   Number of Visits: 36/36  Reason for Discharge:  Patient reached a stable level of exercise. Patient independent in their exercise. Patient has met program and personal goals.  Smoking History:  History  Smoking Status  . Never Smoker  Smokeless Tobacco  . Never Used    Diagnosis:  Status post coronary artery stent placement  Initial Exercise Prescription:     Initial Exercise Prescription - 08/30/16 1400      Date of Initial Exercise RX and Referring Provider   Date 08/30/16   Referring Provider [facility]     Treadmill   MPH 1.7   Grade 0   Minutes 15   METs 2.3     Recumbant Bike   Level 1   RPM 60   Watts 10   Minutes 15   METs 2.3     NuStep   Level 2   SPM 80   Minutes 15   METs 2.3     Prescription Details   Frequency (times per week) 3   Duration Progress to 45 minutes of aerobic exercise without signs/symptoms of physical distress     Intensity   THRR 40-80% of Max Heartrate 94-128   Ratings of Perceived Exertion 11-13   Perceived Dyspnea 0-4     Resistance Training   Training Prescription Yes   Weight 2   Reps 10-15      Discharge Exercise Prescription (Final Exercise Prescription Changes):     Exercise Prescription Changes - 12/05/16 1200      Response to Exercise   Blood Pressure (Admit) 118/72   Blood Pressure (Exercise) 110/58   Blood Pressure (Exit) 116/70   Heart Rate (Admit) 62 bpm   Heart Rate (Exercise) 98 bpm   Heart Rate (Exit) 61 bpm   Perceived Dyspnea (Exercise) 14   Symptoms none   Duration Continue with 45 min of aerobic exercise without signs/symptoms of physical distress.   Intensity THRR unchanged     Progression   Progression Continue to progress workloads to maintain intensity without signs/symptoms of physical distress.   Average METs 2.97      Resistance Training   Training Prescription Yes   Weight 2 lb   Reps 10-15     Treadmill   MPH 2.3   Grade 0.5   Minutes 15   METs 3.08     Recumbant Bike   Level 3   Watts 23   Minutes 15   METs 2.84     Home Exercise Plan   Plans to continue exercise at Home (comment)  walking and going to gym in neighborhood   Frequency Add 3 additional days to program exercise sessions.   Initial Home Exercises Provided 09/26/16      Functional Capacity:     6 Minute Walk    Row Name 08/30/16 1424 08/30/16 1532 12/11/16 0954     6 Minute Walk   Phase Initial  - Discharge   Distance 1430 feet 1430 feet 1715 feet   Distance % Change  -  - 20 %   Distance Feet Change  -  - 285 ft   Walk Time 6 minutes 6 minutes 6 minutes   # of Rest Breaks 0  - 0   MPH 2.7  - 3.25   METS 2.28  - 3.09   RPE 17 17 15  Perceived Dyspnea   -  - 2   VO2 Peak 7.97  - 10.84   Symptoms Yes (comment) Yes (comment) Yes (comment)   Comments sciatic pain 10 sciatic pain 10 sciatica 3/10   Resting HR 61 bpm 61 bpm 67 bpm   Resting BP 124/90 124/90 138/60   Resting Oxygen Saturation   -  - 96 %   Max Ex. HR 84 bpm 84 bpm 108 bpm   Max Ex. BP 142/80 142/80 152/64   2 Minute Post BP 128/88  -  -      Quality of Life:     Quality of Life - 08/30/16 1424      Quality of Life Scores   Health/Function Pre 23.57 %   Socioeconomic Pre 17.79 %   Psych/Spiritual Pre 23.14 %   Family Pre 27 %   GLOBAL Pre 22.58 %      Personal Goals: Goals established at orientation with interventions provided to work toward goal.     Personal Goals and Risk Factors at Admission - 08/30/16 1429      Core Components/Risk Factors/Patient Goals on Admission   Hypertension Yes   Intervention Provide education on lifestyle modifcations including regular physical activity/exercise, weight management, moderate sodium restriction and increased consumption of fresh fruit, vegetables, and low fat dairy, alcohol moderation,  and smoking cessation.;Monitor prescription use compliance.   Expected Outcomes Short Term: Continued assessment and intervention until BP is < 140/48m HG in hypertensive participants. < 130/849mHG in hypertensive participants with diabetes, heart failure or chronic kidney disease.;Long Term: Maintenance of blood pressure at goal levels.   Lipids Yes   Intervention Provide education and support for participant on nutrition & aerobic/resistive exercise along with prescribed medications to achieve LDL <7080mHDL >29m87m Expected Outcomes Short Term: Participant states understanding of desired cholesterol values and is compliant with medications prescribed. Participant is following exercise prescription and nutrition guidelines.;Long Term: Cholesterol controlled with medications as prescribed, with individualized exercise RX and with personalized nutrition plan. Value goals: LDL < 70mg66mL > 40 mg.   Stress Yes   Intervention Offer individual and/or small group education and counseling on adjustment to heart disease, stress management and health-related lifestyle change. Teach and support self-help strategies.;Refer participants experiencing significant psychosocial distress to appropriate mental health specialists for further evaluation and treatment. When possible, include family members and significant others in education/counseling sessions.   Expected Outcomes Short Term: Participant demonstrates changes in health-related behavior, relaxation and other stress management skills, ability to obtain effective social support, and compliance with psychotropic medications if prescribed.;Long Term: Emotional wellbeing is indicated by absence of clinically significant psychosocial distress or social isolation.       Personal Goals Discharge:     Goals and Risk Factor Review - 11/08/16 1128      Core Components/Risk Factors/Patient Goals Review   Personal Goals Review Weight  Management/Obesity;Hypertension;Lipids   Review [name] is going to the gym twice a week.  Her weight was up today as she had chocolate cake for dessert.  She knows she needs to watch her portions and make smart decisions.  Her blood pressures have been good.  Her meds are working for her.   Expected Outcomes Short: [name] will continue to work on weight loss.  Long: Continue to make healthier dietary choices.       Exercise Goals and Review:     Exercise Goals    Row Name 08/30/16 1423  Exercise Goals   Increase Physical Activity Yes       Intervention Provide advice, education, support and counseling about physical activity/exercise needs.;Develop an individualized exercise prescription for aerobic and resistive training based on initial evaluation findings, risk stratification, comorbidities and participant's personal goals.       Expected Outcomes Achievement of increased cardiorespiratory fitness and enhanced flexibility, muscular endurance and strength shown through measurements of functional capacity and personal statement of participant.       Increase Strength and Stamina Yes       Intervention Provide advice, education, support and counseling about physical activity/exercise needs.;Develop an individualized exercise prescription for aerobic and resistive training based on initial evaluation findings, risk stratification, comorbidities and participant's personal goals.       Expected Outcomes Achievement of increased cardiorespiratory fitness and enhanced flexibility, muscular endurance and strength shown through measurements of functional capacity and personal statement of participant.          Nutrition & Weight - Outcomes:     Pre Biometrics - 08/30/16 1422      Pre Biometrics   Height 5' 3.5" (1.613 m)   Weight 203 lb 12.8 oz (92.4 kg)   Waist Circumference 43.5 inches   Hip Circumference 51 inches   Waist to Hip Ratio 0.85 %   BMI (Calculated) 35.6   Single  Leg Stand 6.36 seconds         Post Biometrics - 12/11/16 0954       Post  Biometrics   Height 5' 3.5" (1.613 m)   Weight 205 lb (93 kg)   Waist Circumference 43.5 inches   Hip Circumference 50.5 inches   Waist to Hip Ratio 0.86 %   BMI (Calculated) 35.74   Single Leg Stand 6.39 seconds      Nutrition:     Nutrition Therapy & Goals - 09/25/16 1155      Nutrition Therapy   Diet TLC   Drug/Food Interactions Statins/Certain Fruits   Protein (specify units) 7oz   Fiber 20 grams   Fruits and Vegetables 5 servings/day   Sodium 1500 grams     Personal Nutrition Goals   Personal Goal #2 Lose at least 10lbs -- by continuing to work on portion control, especially starches   Personal Goal #3 track food intake using free phone app   Comments Ms. Conkey and her husband have both been making an effort to improve diet with lower fat and lower carb intake. She states limiting portions is the biggest challenge for her, but does want to continue with this goal.      Intervention Plan   Intervention Nutrition handout(s) given to patient.      Nutrition Discharge:     Nutrition Assessments - 08/30/16 1417      MEDFICTS Scores   Pre Score 80      Education Questionnaire Score:     Knowledge Questionnaire Score - 08/30/16 1426      Knowledge Questionnaire Score   Pre Score 20/28      Goals reviewed with patient; copy given to patient.

## 2016-12-17 DIAGNOSIS — I25119 Atherosclerotic heart disease of native coronary artery with unspecified angina pectoris: Secondary | ICD-10-CM | POA: Diagnosis not present

## 2016-12-17 DIAGNOSIS — W19XXXA Unspecified fall, initial encounter: Secondary | ICD-10-CM | POA: Diagnosis not present

## 2016-12-17 DIAGNOSIS — R42 Dizziness and giddiness: Secondary | ICD-10-CM | POA: Diagnosis not present

## 2016-12-17 DIAGNOSIS — M25551 Pain in right hip: Secondary | ICD-10-CM | POA: Diagnosis not present

## 2016-12-18 DIAGNOSIS — Z955 Presence of coronary angioplasty implant and graft: Secondary | ICD-10-CM

## 2016-12-18 NOTE — Progress Notes (Signed)
Discharge Progress Report  Patient Details  Name: Linda Yang MRN: [id number] Date of Birth: 1940/09/10 Referring Provider:     Cardiac Rehab from 08/30/2016 in [facility] Cardiac and Pulmonary Rehab  Referring Provider  [facility]       Number of Visits: 36/36  Reason for Discharge:  Patient reached a stable level of exercise. Patient has met program and personal goals.  Smoking History:  History  Smoking Status  . Never Smoker  Smokeless Tobacco  . Never Used    Diagnosis:  Status post coronary artery stent placement  ADL UCSD:   Initial Exercise Prescription:     Initial Exercise Prescription - 08/30/16 1400      Date of Initial Exercise RX and Referring Provider   Date 08/30/16   Referring Provider [facility]     Treadmill   MPH 1.7   Grade 0   Minutes 15   METs 2.3     Recumbant Bike   Level 1   RPM 60   Watts 10   Minutes 15   METs 2.3     NuStep   Level 2   SPM 80   Minutes 15   METs 2.3     Prescription Details   Frequency (times per week) 3   Duration Progress to 45 minutes of aerobic exercise without signs/symptoms of physical distress     Intensity   THRR 40-80% of Max Heartrate 94-128   Ratings of Perceived Exertion 11-13   Perceived Dyspnea 0-4     Resistance Training   Training Prescription Yes   Weight 2   Reps 10-15      Discharge Exercise Prescription (Final Exercise Prescription Changes):     Exercise Prescription Changes - 12/05/16 1200      Response to Exercise   Blood Pressure (Admit) 118/72   Blood Pressure (Exercise) 110/58   Blood Pressure (Exit) 116/70   Heart Rate (Admit) 62 bpm   Heart Rate (Exercise) 98 bpm   Heart Rate (Exit) 61 bpm   Perceived Dyspnea (Exercise) 14   Symptoms none   Duration Continue with 45 min of aerobic exercise without signs/symptoms of physical distress.   Intensity THRR unchanged     Progression   Progression Continue to progress workloads to maintain intensity without  signs/symptoms of physical distress.   Average METs 2.97     Resistance Training   Training Prescription Yes   Weight 2 lb   Reps 10-15     Treadmill   MPH 2.3   Grade 0.5   Minutes 15   METs 3.08     Recumbant Bike   Level 3   Watts 23   Minutes 15   METs 2.84     Home Exercise Plan   Plans to continue exercise at Home (comment)  walking and going to gym in neighborhood   Frequency Add 3 additional days to program exercise sessions.   Initial Home Exercises Provided 09/26/16      Functional Capacity:     6 Minute Walk    Row Name 08/30/16 1424 08/30/16 1532 12/11/16 0954     6 Minute Walk   Phase Initial  - Discharge   Distance 1430 feet 1430 feet 1715 feet   Distance % Change  -  - 20 %   Distance Feet Change  -  - 285 ft   Walk Time 6 minutes 6 minutes 6 minutes   # of Rest Breaks 0  - 0   MPH  2.7  - 3.25   METS 2.28  - 3.09   RPE _0 Perceived Dyspnea   -  - 2   VO2 Peak 7.97  - 10.84   Symptoms Yes (comment) Yes (comment) Yes (comment)   Comments sciatic pain 10 sciatic pain 10 sciatica 3/10   Resting HR 61 bpm 61 bpm 67 bpm   Resting BP 124/90 124/90 138/60   Resting Oxygen Saturation   -  - 96 %   Max Ex. HR 84 bpm 84 bpm 108 bpm   Max Ex. BP 142/80 142/80 152/64   2 Minute Post BP 128/88  -  -      Psychological, QOL, Others - Outcomes: PHQ 2/9: Depression screen [facility] 2/9 12/18/2016 08/30/2016  Decreased Interest 1 0  Down, Depressed, Hopeless 0 0  PHQ - 2 Score 1 0  Altered sleeping 1 1  Tired, decreased energy 1 1  Change in appetite 3 2  Feeling bad or failure about yourself  2 0  Trouble concentrating 1 2  Moving slowly or fidgety/restless 1 2  Suicidal thoughts - 0  PHQ-9 Score 10 8  Difficult doing work/chores Somewhat difficult Somewhat difficult    Quality of Life:     Quality of Life - 12/18/16 1108      Quality of Life Scores   Health/Function Pre 23.57 %   Health/Function Post 24.13 %   Health/Function % Change  2.38 %   Socioeconomic Pre 17.79 %   Socioeconomic Post 21.43 %   Socioeconomic % Change  20.46 %   Psych/Spiritual Pre 23.14 %   Psych/Spiritual Post 18 %   Psych/Spiritual % Change -22.21 %   Family Pre 27 %   Family Post 26.4 %   Family % Change -2.22 %   GLOBAL Pre 22.58 %   GLOBAL Post 22.65 %   GLOBAL % Change 0.31 %      Personal Goals: Goals established at orientation with interventions provided to work toward goal.     Personal Goals and Risk Factors at Admission - 08/30/16 1429      Core Components/Risk Factors/Patient Goals on Admission   Hypertension Yes   Intervention Provide education on lifestyle modifcations including regular physical activity/exercise, weight management, moderate sodium restriction and increased consumption of fresh fruit, vegetables, and low fat dairy, alcohol moderation, and smoking cessation.;Monitor prescription use compliance.   Expected Outcomes Short Term: Continued assessment and intervention until BP is < 140/37m HG in hypertensive participants. < 130/814mHG in hypertensive participants with diabetes, heart failure or chronic kidney disease.;Long Term: Maintenance of blood pressure at goal levels.   Lipids Yes   Intervention Provide education and support for participant on nutrition & aerobic/resistive exercise along with prescribed medications to achieve LDL <7017mHDL >45m3m Expected Outcomes Short Term: Participant states understanding of desired cholesterol values and is compliant with medications prescribed. Participant is following exercise prescription and nutrition guidelines.;Long Term: Cholesterol controlled with medications as prescribed, with individualized exercise RX and with personalized nutrition plan. Value goals: LDL < 70mg88mL > 40 mg.   Stress Yes   Intervention Offer individual and/or small group education and counseling on adjustment to heart disease, stress management and health-related lifestyle change. Teach and  support self-help strategies.;Refer participants experiencing significant psychosocial distress to appropriate mental health specialists for further evaluation and treatment. When possible, include family members and significant others in education/counseling sessions.   Expected Outcomes Short Term: Participant demonstrates changes  in health-related behavior, relaxation and other stress management skills, ability to obtain effective social support, and compliance with psychotropic medications if prescribed.;Long Term: Emotional wellbeing is indicated by absence of clinically significant psychosocial distress or social isolation.       Personal Goals Discharge:     Goals and Risk Factor Review    Row Name 09/20/16 0957 10/16/16 1122 11/08/16 1128         Core Components/Risk Factors/Patient Goals Review   Personal Goals Review Weight Management/Obesity;Hypertension;Lipids;Stress Weight Management/Obesity;Stress Weight Management/Obesity;Hypertension;Lipids     Review [name]'s weight has started to come back down.  She has cut back on sugar and eating better. She is now more motivated to get up and get out of the house and be healthier.  [name]'s blood pressures have been stable.  She has been doing well on her medications.  She is getting better with her stress levels as well. [name] states she has not started walking at home.  She plans to walk at her grandsons gym when he opens.  Her weight is about the same.  She did meet with the RD and is trying to buy and prepare fresh foods and not boxed foods.  She states she knows what to do and also needs to control portion sizes.  She deals with stress by exercising.  Her main stress has been that her dog is sick and one grandson is going into the [employer].  [name] is going to the gym twice a week.  Her weight was up today as she had chocolate cake for dessert.  She knows she needs to watch her portions and make smart decisions.  Her blood pressures have been  good.  Her meds are working for her.     Expected Outcomes Short: Continue to work on weight loss.  Long: Continue to work on risk factor modification.  Short - [name] will begin adding one day per wek walking in addition to HT.  Long - [name] will continue exercising oon her own and will implement healthier dietary choices. Short: [name] will continue to work on weight loss.  Long: Continue to make healthier dietary choices.         Exercise Goals and Review:     Exercise Goals    Row Name 08/30/16 1423             Exercise Goals   Increase Physical Activity Yes       Intervention Provide advice, education, support and counseling about physical activity/exercise needs.;Develop an individualized exercise prescription for aerobic and resistive training based on initial evaluation findings, risk stratification, comorbidities and participant's personal goals.       Expected Outcomes Achievement of increased cardiorespiratory fitness and enhanced flexibility, muscular endurance and strength shown through measurements of functional capacity and personal statement of participant.       Increase Strength and Stamina Yes       Intervention Provide advice, education, support and counseling about physical activity/exercise needs.;Develop an individualized exercise prescription for aerobic and resistive training based on initial evaluation findings, risk stratification, comorbidities and participant's personal goals.       Expected Outcomes Achievement of increased cardiorespiratory fitness and enhanced flexibility, muscular endurance and strength shown through measurements of functional capacity and personal statement of participant.          Nutrition & Weight - Outcomes:     Pre Biometrics - 08/30/16 1422      Pre Biometrics   Height 5' 3.5" (1.613 m)  Weight 203 lb 12.8 oz (92.4 kg)   Waist Circumference 43.5 inches   Hip Circumference 51 inches   Waist to Hip Ratio 0.85 %   BMI  (Calculated) 35.6   Single Leg Stand 6.36 seconds         Post Biometrics - 12/11/16 0954       Post  Biometrics   Height 5' 3.5" (1.613 m)   Weight 205 lb (93 kg)   Waist Circumference 43.5 inches   Hip Circumference 50.5 inches   Waist to Hip Ratio 0.86 %   BMI (Calculated) 35.74   Single Leg Stand 6.39 seconds      Nutrition:     Nutrition Therapy & Goals - 09/25/16 1155      Nutrition Therapy   Diet TLC   Drug/Food Interactions Statins/Certain Fruits   Protein (specify units) 7oz   Fiber 20 grams   Fruits and Vegetables 5 servings/day   Sodium 1500 grams     Personal Nutrition Goals   Personal Goal #2 Lose at least 10lbs -- by continuing to work on portion control, especially starches   Personal Goal #3 track food intake using free phone app   Comments Ms. Wahlquist and her husband have both been making an effort to improve diet with lower fat and lower carb intake. She states limiting portions is the biggest challenge for her, but does want to continue with this goal.      Intervention Plan   Intervention Nutrition handout(s) given to patient.      Nutrition Discharge:     Nutrition Assessments - 12/18/16 1110      MEDFICTS Scores   Pre Score 80   Post Score 59   Score Difference -21      Education Questionnaire Score:     Knowledge Questionnaire Score - 12/18/16 1041      Knowledge Questionnaire Score   Pre Score (P)  20/28   Post Score (P)  20/28      Goals reviewed with patient; copy given to patient.

## 2016-12-18 NOTE — Progress Notes (Signed)
Daily Session Note  Patient Details  Name: Linda Yang MRN: [id number] Date of Birth: 11-Mar-1940 Referring Provider:     Cardiac Rehab from 08/30/2016 in [facility] Cardiac and Pulmonary Rehab  Referring Provider  [facility]      Encounter Date: 12/18/2016  Check In:     Session Check In - 12/18/16 0856      Check-In   Location [facility]-Cardiac & Pulmonary Rehab   Staff Present Heath Lark, RN, BSN, CCRP;Aidenn Skellenger Luan Pulling, MA, ACSM RCEP, Exercise Physiologist;Amanda Oletta Darter, [place], ACSM CEP, Exercise Physiologist   Supervising physician immediately available to respond to emergencies See telemetry face sheet for immediately available ER MD   Medication changes reported     No   Fall or balance concerns reported    No   Warm-up and Cool-down Performed on first and last piece of equipment   Resistance Training Performed Yes   VAD Patient? No     Pain Assessment   Currently in Pain? No/denies         History  Smoking Status  . Never Smoker  Smokeless Tobacco  . Never Used    Goals Met:  Independence with exercise equipment Exercise tolerated well Personal goals reviewed No report of cardiac concerns or symptoms Strength training completed today  Goals Unmet:  Not Applicable  Comments:  Linda Yang graduated today from cardiac rehab with 36 sessions completed.  Details of the patient's exercise prescription and what She needs to do in order to continue the prescription and progress were discussed with patient.  Patient was given a copy of prescription and goals.  Patient verbalized understanding.  Linda Yang plans to continue to exercise by walking and going to her grandson's new gym.    Dr. Emily Filbert is Medical Director for [place] and [facility].

## 2016-12-18 NOTE — Progress Notes (Signed)
Cardiac Individual Treatment Plan  Patient Details  Name: Linda Yang MRN: [id number] Date of Birth: 05/19/1940 Referring Provider:     Cardiac Rehab from 08/30/2016 in [facility] Cardiac and Pulmonary Rehab  Referring Provider  [facility]      Initial Encounter Date:    Cardiac Rehab from 08/30/2016 in [facility] Cardiac and Pulmonary Rehab  Date  08/30/16  Referring Provider  [facility]      Visit Diagnosis: Status post coronary artery stent placement  Patient's Home Medications on Admission:  Current Outpatient Prescriptions:  .  aspirin EC 81 MG tablet, Take 81 mg by mouth at bedtime., Disp: , Rfl:  .  atorvastatin (LIPITOR) 40 MG tablet, Take by mouth., Disp: , Rfl:  .  Calcium Carbonate-Simethicone (ALKA-SELTZER HEARTBURN + GAS) 750-80 MG CHEW, Chew 1 tablet by mouth daily as needed (heartburn)., Disp: , Rfl:  .  Camphor-Menthol-Capsicum (TIGER BALM PAIN RELIEVING) 80-24-16 MG PTCH, Apply 1-3 patches topically daily as needed (pain)., Disp: , Rfl:  .  cholecalciferol (VITAMIN D) 400 units TABS tablet, Take 400 Units by mouth at bedtime., Disp: , Rfl:  .  ferrous sulfate 325 (65 FE) MG tablet, Take 325 mg by mouth at bedtime., Disp: , Rfl:  .  fexofenadine (ALLEGRA) 180 MG tablet, Take 180 mg by mouth at bedtime as needed for allergies or rhinitis., Disp: , Rfl:  .  HYDROcodone-acetaminophen (NORCO/VICODIN) 5-325 MG tablet, Take 1 tablet by mouth daily as needed for moderate pain., Disp: , Rfl:  .  metoprolol succinate (TOPROL-XL) 25 MG 24 hr tablet, Take by mouth., Disp: , Rfl:  .  nitroGLYCERIN (NITROSTAT) 0.4 MG SL tablet, Place 0.4 mg under the tongue every 5 (five) minutes as needed for chest pain. , Disp: , Rfl:  .  PARoxetine (PAXIL) 20 MG tablet, Take 20 mg by mouth at bedtime., Disp: , Rfl:  .  ticagrelor (BRILINTA) 90 MG TABS tablet, Take 1 tablet (90 mg total) by mouth 2 (two) times daily., Disp: 60 tablet, Rfl: 12 .  trolamine salicylate (ASPERCREME) 10 % cream, Apply 1 application  topically as needed for muscle pain., Disp: , Rfl:  .  vitamin B-12 (CYANOCOBALAMIN) 1000 MCG tablet, Take 1,000 mcg by mouth at bedtime., Disp: , Rfl:  .  Vitamin D, Ergocalciferol, (DRISDOL) 50000 units CAPS capsule, Take 50,000 Units by mouth every Thursday., Disp: , Rfl:   Past Medical History: Past Medical History:  Diagnosis Date  . Coronary artery disease   . High cholesterol     Tobacco Use: History  Smoking Status  . Never Smoker  Smokeless Tobacco  . Never Used    Labs: Recent Review Flowsheet Data    Labs for ITP Cardiac and Pulmonary Rehab Latest Ref Rng & Units 08/05/2009   Cholestrol 0 - 200 mg/dL 236(H)   LDLDIRECT mg/dL 142.2   HDL >39.00 mg/dL 45.90   Trlycerides 0.0 - 149.0 mg/dL 214.0(H)       Exercise Target Goals:    Exercise Program Goal: Individual exercise prescription set with THRR, safety & activity barriers. Participant demonstrates ability to understand and report RPE using BORG scale, to self-measure pulse accurately, and to acknowledge the importance of the exercise prescription.  Exercise Prescription Goal: Starting with aerobic activity 30 plus minutes a day, 3 days per week for initial exercise prescription. Provide home exercise prescription and guidelines that participant acknowledges understanding prior to discharge.  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 Has meds to use for flareups   Cardiac Risk Stratification High      6 Minute Walk:     6 Minute Walk    Row Name 08/30/16 1424 08/30/16 1532 12/11/16 0954     6 Minute Walk   Phase Initial  - Discharge   Distance 1430 feet 1430 feet 1715 feet   Distance % Change  -  - 20 %   Distance Feet Change  -  - 285 ft   Walk Time 6 minutes 6  minutes 6 minutes   # of Rest Breaks 0  - 0   MPH 2.7  - 3.25   METS 2.28  - 3.09   RPE 17 17 15    Perceived Dyspnea   -  - 2   VO2 Peak 7.97  - 10.84   Symptoms Yes (comment) Yes (comment) Yes (comment)   Comments sciatic pain 10 sciatic pain 10 sciatica 3/10   Resting HR 61 bpm 61 bpm 67 bpm   Resting BP 124/90 124/90 138/60   Resting Oxygen Saturation   -  - 96 %   Max Ex. HR 84 bpm 84 bpm 108 bpm   Max Ex. BP 142/80 142/80 152/64   2 Minute Post BP 128/88  -  -      Oxygen Initial Assessment:   Oxygen Re-Evaluation:   Oxygen Discharge (Final Oxygen Re-Evaluation):   Initial Exercise Prescription:     Initial Exercise Prescription - 08/30/16 1400      Date of Initial Exercise RX and Referring Provider   Date 08/30/16   Referring Provider [facility]     Treadmill   MPH 1.7   Grade 0   Minutes 15   METs 2.3     Recumbant Bike   Level 1   RPM 60   Watts 10   Minutes 15   METs 2.3     NuStep   Level 2   SPM 80   Minutes 15   METs 2.3     Prescription Details   Frequency (times per week) 3   Duration Progress to 45 minutes of aerobic exercise without signs/symptoms of physical distress     Intensity   THRR 40-80% of Max Heartrate 94-128   Ratings of Perceived Exertion 11-13   Perceived Dyspnea 0-4     Resistance Training   Training Prescription Yes   Weight 2   Reps 10-15      Perform Capillary Blood Glucose checks as needed.  Exercise Prescription Changes:     Exercise Prescription Changes    Row Name 08/30/16 1400 09/12/16 1500 09/27/16 1600 10/12/16 1400 10/23/16 1600     Response to Exercise   Blood Pressure (Admit) 124/90 122/60 130/72 122/82 126/60   Blood Pressure (Exercise) 142/80 124/60 126/60 126/72 132/70   Blood Pressure (Exit) 128/88 130/60 112/60 118/66 124/70   Heart Rate (Admit) 61 bpm 69 bpm 76 bpm 79 bpm 71 bpm   Heart Rate (Exercise) 86 bpm 113 bpm 77 bpm 87 bpm 93 bpm   Heart Rate (Exit) 65 bpm 62 bpm 61 bpm 57 bpm  64 bpm   Perceived Dyspnea (Exercise) 17 11 11 13 13    Symptoms  - none none nonw none   Comments  - first full day of exercise  -  -  -  Duration  - Progress to 45 minutes of aerobic exercise without signs/symptoms of physical distress Continue with 45 min of aerobic exercise without signs/symptoms of physical distress. Continue with 45 min of aerobic exercise without signs/symptoms of physical distress. Continue with 45 min of aerobic exercise without signs/symptoms of physical distress.   Intensity  - THRR unchanged THRR unchanged THRR unchanged THRR unchanged     Progression   Progression  - Continue to progress workloads to maintain intensity without signs/symptoms of physical distress. Continue to progress workloads to maintain intensity without signs/symptoms of physical distress. Continue to progress workloads to maintain intensity without signs/symptoms of physical distress. Continue to progress workloads to maintain intensity without signs/symptoms of physical distress.   Average METs  - 2 2.3 2.42 2.45     Resistance Training   Training Prescription  - Yes Yes Yes Yes   Weight  - 2 lbs 2 lbs 2 lbs 2 lbs   Reps  - 10-15 10-15 10-15 10-15     Interval Training   Interval Training  - No No No No     Treadmill   MPH  -  - 2 2 2.3   Grade  -  - 0 0 0   Minutes  -  - 15 15 15    METs  -  - 2.53 2.53 2.76     Recumbant Bike   Level  - 3 3 3 3    Watts  - 29 29 25 25    Minutes  - 15 15 15 15    METs  - 3.05 3.05 2.83 2.83     NuStep   Level  - 2 2 2 2    Minutes  - 15 15 15 15    METs  - 1.7 1.5 2 1.8     Home Exercise Plan   Plans to continue exercise at  -  - Home (comment)  walking and going to gym in neighborhood Home (comment)  walking and going to gym in neighborhood Home (comment)  walking and going to gym in neighborhood   Frequency  -  - Add 3 additional days to program exercise sessions. Add 3 additional days to program exercise sessions. Add 3 additional days to  program exercise sessions.   Initial Home Exercises Provided  -  - 09/26/16 09/26/16 09/26/16   Row Name 11/06/16 1400 11/23/16 1000 12/05/16 1200         Response to Exercise   Blood Pressure (Admit) 128/68 134/70 118/72     Blood Pressure (Exercise) 124/84 138/60 110/58     Blood Pressure (Exit) 122/60 124/60 116/70     Heart Rate (Admit) 65 bpm 63 bpm 62 bpm     Heart Rate (Exercise) 83 bpm 111 bpm 98 bpm     Heart Rate (Exit) 62 bpm 70 bpm 61 bpm     Perceived Dyspnea (Exercise) 13 15 14      Symptoms none none none     Duration Continue with 45 min of aerobic exercise without signs/symptoms of physical distress. Continue with 45 min of aerobic exercise without signs/symptoms of physical distress. Continue with 45 min of aerobic exercise without signs/symptoms of physical distress.     Intensity THRR unchanged THRR unchanged THRR unchanged       Progression   Progression Continue to progress workloads to maintain intensity without signs/symptoms of physical distress. Continue to progress workloads to maintain intensity without signs/symptoms of physical distress. Continue to progress workloads to maintain intensity without signs/symptoms of  physical distress.     Average METs 2.63 2.99 2.97       Resistance Training   Training Prescription Yes Yes Yes     Weight 2 lbs 2 lbs 2 lb     Reps 10-15 10-15 10-15       Interval Training   Interval Training No No  -       Treadmill   MPH 2.3 2.3 2.3     Grade 0 0.5 0.5     Minutes 15 15 15      METs 2.76 2.92 3.08       Recumbant Bike   Level 3 3 3      Watts 29 29 23      Minutes 15 15 15      METs 3.05 3.05 2.84       NuStep   Level 2  -  -     Minutes 15  -  -     METs 2.1  -  -       Home Exercise Plan   Plans to continue exercise at Home (comment)  walking and going to gym in neighborhood Home (comment)  walking and going to gym in neighborhood Home (comment)  walking and going to gym in neighborhood     Frequency Add 3  additional days to program exercise sessions. Add 3 additional days to program exercise sessions. Add 3 additional days to program exercise sessions.     Initial Home Exercises Provided 09/26/16 09/26/16 09/26/16        Exercise Comments:     Exercise Comments    Row Name 09/11/16 0915 11/01/16 0935 12/18/16 1127       Exercise Comments First full day of exercise!  Patient was oriented to gym and equipment including functions, settings, policies, and procedures.  Patient's individual exercise prescription and treatment plan were reviewed.  All starting workloads were established based on the results of the 6 minute walk test done at initial orientation visit.  The plan for exercise progression was also introduced and progression will be customized based on patient's performance and goals. Linda Yang did reduced workloads today as she has a stress test scheduled this afternoon and still wanted to exercise in class today.  Linda Yang graduated today from cardiac rehab with 36 sessions completed.  Details of the patient's exercise prescription and what She needs to do in order to continue the prescription and progress were discussed with patient.  Patient was given a copy of prescription and goals.  Patient verbalized understanding.  Linda Yang plans to continue to exercise by walking and going to her grandson's new gym.        Exercise Goals and Review:     Exercise Goals    Row Name 08/30/16 1423             Exercise Goals   Increase Physical Activity Yes       Intervention Provide advice, education, support and counseling about physical activity/exercise needs.;Develop an individualized exercise prescription for aerobic and resistive training based on initial evaluation findings, risk stratification, comorbidities and participant's personal goals.       Expected Outcomes Achievement of increased cardiorespiratory fitness and enhanced flexibility, muscular endurance and strength shown through  measurements of functional capacity and personal statement of participant.       Increase Strength and Stamina Yes       Intervention Provide advice, education, support and counseling about physical activity/exercise needs.;Develop an individualized exercise prescription for aerobic and resistive  training based on initial evaluation findings, risk stratification, comorbidities and participant's personal goals.       Expected Outcomes Achievement of increased cardiorespiratory fitness and enhanced flexibility, muscular endurance and strength shown through measurements of functional capacity and personal statement of participant.          Exercise Goals Re-Evaluation :     Exercise Goals Re-Evaluation    Row Name 09/12/16 1458 09/20/16 0911 09/25/16 1019 09/27/16 1601 10/12/16 1426     Exercise Goal Re-Evaluation   Exercise Goals Review Increase Physical Activity;Increase Strenth and Stamina Increase Physical Activity;Increase Strenth and Stamina Increase Physical Activity;Increase Strenth and Stamina Increase Physical Activity;Increase Strenth and Stamina Increase Physical Activity;Increase Strenth and Stamina   Comments Linda Yang has completed her first full day of exercise. She did well and we will continue to monitor her progression.  Linda Yang has been doing well in rehab.  She is feeling stronger and has more energy.  She has already started to notice a diffence in how she is feeling.  It makes her want to get up and get out to do more to feel better. Reviewed home exercise with pt today.  Pt plans to walk and go to gym at home for exercise.  Reviewed THR, pulse, RPE, sign and symptoms, and when to call 911 or MD.  Also discussed weather considerations and indoor options.  Pt voiced understanding. Linda Yang has been doing well in rehab.  She is up to 29 watts on the recumbent bike.  Her attendance will become sporadiac with upcoming potential surgeries.  She is going to keep [place] up to date with everything.  We  will continue to monitor her progression.  Linda Yang has been doing well in rehab.  She is now up to level 3 on the bike!  We will continue to monitor her progression.    Expected Outcomes Short: Linda Yang will continue to attend classes regularly.  Long: Increase physical activity.  Short: Review home exercise. Long: Continue to exercise regularly. Short: Add in home exercise.  Long: Exercise regulary here and at home. Short: Start walking at home.  Long: Make exercise part of routine.  Short: Walk more at home.  Long: Continue to build strength and stamina.    Ford Name 10/23/16 1642 11/06/16 1414 11/08/16 1126 11/23/16 1036 12/05/16 1209     Exercise Goal Re-Evaluation   Exercise Goals Review Increase Physical Activity;Increase Strength and Stamina Increase Physical Activity;Increase Strength and Stamina Increase Physical Activity;Increase Strength and Stamina;Understanding of Exercise Prescription Increase Physical Activity;Increase Strength and Stamina Increase Physical Activity;Increase Strength and Stamina   Comments Linda Yang continues to do well in rehab.  She is now up to 2.3 mph on the treadmill. We will continue to monitor her progression.  Linda Yang has continued to do well in rehab.  She is up to 29 Watts on the recumbent bike.  We will continue to monitor her progress Linda Yang has been doing well.  She has been going to the gym twice a week.  Her strength and stamina are getting better.  She is just very busy.  She had her stress test last week but did not last long enough for it to be diagnostic.  She likes coming to rehab as it makes her feel safe. Linda Yang has been doing well in rehab. She had appointments keeping her out this week.  She is up to 2.3 mph and 0.5% grade on the treadmill. We will continue to monitor her progression.  Linda Yang continues to do well in  Rehab.  Staff will monitor progress.   Expected Outcomes Short: Increase workloads.  Long: Exercise more independently. Short: Discuss adding in intervals.   Long: Continue to build strenght and stamina.  Short: Discuss adding in intervals.  Long: Continue to build strenght and stamina.  Short: Try to increase more workloads.  Long: Continue to increase physical activity.  Short - Linda Yang will continue to attend regularly.  Long - Linda Yang will graduate HT.       Discharge Exercise Prescription (Final Exercise Prescription Changes):     Exercise Prescription Changes - 12/05/16 1200      Response to Exercise   Blood Pressure (Admit) 118/72   Blood Pressure (Exercise) 110/58   Blood Pressure (Exit) 116/70   Heart Rate (Admit) 62 bpm   Heart Rate (Exercise) 98 bpm   Heart Rate (Exit) 61 bpm   Perceived Dyspnea (Exercise) 14   Symptoms none   Duration Continue with 45 min of aerobic exercise without signs/symptoms of physical distress.   Intensity THRR unchanged     Progression   Progression Continue to progress workloads to maintain intensity without signs/symptoms of physical distress.   Average METs 2.97     Resistance Training   Training Prescription Yes   Weight 2 lb   Reps 10-15     Treadmill   MPH 2.3   Grade 0.5   Minutes 15   METs 3.08     Recumbant Bike   Level 3   Watts 23   Minutes 15   METs 2.84     Home Exercise Plan   Plans to continue exercise at Home (comment)  walking and going to gym in neighborhood   Frequency Add 3 additional days to program exercise sessions.   Initial Home Exercises Provided 09/26/16      Nutrition:  Target Goals: Understanding of nutrition guidelines, daily intake of sodium <1517m, cholesterol <2086m calories 30% from fat and 7% or less from saturated fats, daily to have 5 or more servings of fruits and vegetables.  Biometrics:     Pre Biometrics - 08/30/16 1422      Pre Biometrics   Height 5' 3.5" (1.613 m)   Weight 203 lb 12.8 oz (92.4 kg)   Waist Circumference 43.5 inches   Hip Circumference 51 inches   Waist to Hip Ratio 0.85 %   BMI (Calculated) 35.6   Single Leg  Stand 6.36 seconds         Post Biometrics - 12/11/16 0954       Post  Biometrics   Height 5' 3.5" (1.613 m)   Weight 205 lb (93 kg)   Waist Circumference 43.5 inches   Hip Circumference 50.5 inches   Waist to Hip Ratio 0.86 %   BMI (Calculated) 35.74   Single Leg Stand 6.39 seconds      Nutrition Therapy Plan and Nutrition Goals:     Nutrition Therapy & Goals - 09/25/16 1155      Nutrition Therapy   Diet TLC   Drug/Food Interactions Statins/Certain Fruits   Protein (specify units) 7oz   Fiber 20 grams   Fruits and Vegetables 5 servings/day   Sodium 1500 grams     Personal Nutrition Goals   Personal Goal #2 Lose at least 10lbs -- by continuing to work on portion control, especially starches   Personal Goal #3 track food intake using free phone app   Comments Ms. IsZanettind her husband have both been making an effort to improve diet  with lower fat and lower carb intake. She states limiting portions is the biggest challenge for her, but does want to continue with this goal.      Intervention Plan   Intervention Nutrition handout(s) given to patient.      Nutrition Discharge: Rate Your Plate Scores:     Nutrition Assessments - 12/18/16 1110      MEDFICTS Scores   Pre Score 80   Post Score 59   Score Difference -21      Nutrition Goals Re-Evaluation:     Nutrition Goals Re-Evaluation    Luther Name 09/20/16 1009 11/08/16 1134           Goals   Current Weight 205 lb 14.4 oz (93.4 kg)  -      Nutrition Goal meet with dietician Lose 10lbs work on portion control, watch starches, track on phone.      Comment Linda Yang has an appointment scheduled to meet with dietician on Aug 7 after class.  She would like help to guide her towards a healthy diet and continue to lose weight. Linda Yang still needs to work on portion control.  Eat a piece of cake not half the cake.  She is not tracking her food intake as she doesn't feel like she has enough time to do that too. She admitted  to needing to coordinate eating and exercise better.  She is doing better overall, but still has some slip ups.       Expected Outcome Meet with dietician Short: Work on portion control.  Long: Coordinate eating and exercise.          Nutrition Goals Discharge (Final Nutrition Goals Re-Evaluation):     Nutrition Goals Re-Evaluation - 11/08/16 1134      Goals   Nutrition Goal Lose 10lbs work on portion control, watch starches, track on phone.   Comment Linda Yang still needs to work on portion control.  Eat a piece of cake not half the cake.  She is not tracking her food intake as she doesn't feel like she has enough time to do that too. She admitted to needing to coordinate eating and exercise better.  She is doing better overall, but still has some slip ups.    Expected Outcome Short: Work on portion control.  Long: Coordinate eating and exercise.       Psychosocial: Target Goals: Acknowledge presence or absence of significant depression and/or stress, maximize coping skills, provide positive support system. Participant is able to verbalize types and ability to use techniques and skills needed for reducing stress and depression.   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 Has been on meds in past.  Stated that she handles it on her own without medication.       Quality of Life Scores:      Quality of Life - 12/18/16 1108      Quality of Life Scores   Health/Function Pre 23.57 %   Health/Function Post 24.13 %   Health/Function % Change 2.38 %   Socioeconomic Pre 17.79 %   Socioeconomic Post 21.43 %   Socioeconomic % Change  20.46 %   Psych/Spiritual Pre 23.14 %   Psych/Spiritual Post 18 %   Psych/Spiritual % Change -22.21 %   Family Pre 27 %   Family  Post 26.4 %   Family % Change -2.22 %   GLOBAL Pre 22.58 %   GLOBAL Post  22.65 %   GLOBAL % Change 0.31 %      PHQ-9: Recent Review Flowsheet Data    Depression screen [facility] 2/9 12/18/2016 08/30/2016   Decreased Interest 1 0   Down, Depressed, Hopeless 0 0   PHQ - 2 Score 1 0   Altered sleeping 1 1   Tired, decreased energy 1 1   Change in appetite 3 2   Feeling bad or failure about yourself  2 0   Trouble concentrating 1 2   Moving slowly or fidgety/restless 1 2   Suicidal thoughts - 0   PHQ-9 Score 10 8   Difficult doing work/chores Somewhat difficult Somewhat difficult     Interpretation of Total Score  Total Score Depression Severity:  1-4 = Minimal depression, 5-9 = Mild depression, 10-14 = Moderate depression, 15-19 = Moderately severe depression, 20-27 = Severe depression   Psychosocial Evaluation and Intervention:     Psychosocial Evaluation - 09/25/16 0954      Psychosocial Evaluation & Interventions   Interventions Encouraged to exercise with the program and follow exercise prescription;Relaxation education;Stress management education   Comments Counselor met with [name] (Linda Yang) today for initial psychosocial evaluation.  She is a 76 year old who had a stent inserted on 5/30.  Linda Yang has a strong support system with a spouse of 61 years and several daughters who live locally.  Linda Yang is also actively involved in her local church.  She reports chronic sleep problems not being able to go to sleep.  She also states she has a history of depression and anxiety and takes medication for this which is helpful.  Linda Yang has multiple stressors in her life with relationship problems; her own health; finances and her current living conditions.  Linda Yang has goals to be healthier overall and "take charge of my life and better care of myself."  Counselor encouraged Linda Yang to speak with her doctor or pharmacist about her sleep problems and we developed a sleep plan for her which includes limited caffeine and afternoon naps to see if this helps.  Counselor and staff  will follow with Linda Yang throughout the course of this program.    Expected Outcomes Linda Yang will benefit from consistent exercise to achieve her stated goals.  The educational and psychoeducational components of this program will be helpful in managing her stress and illness better.     Continue Psychosocial Services  Follow up required by staff      Psychosocial Re-Evaluation:     Psychosocial Re-Evaluation    Prosser Name 09/20/16 1011 11/01/16 0944 11/08/16 1136         Psychosocial Re-Evaluation   Current issues with Current Stress Concerns  - Current Stress Concerns     Comments Linda Yang states that her stress has gotten better since starting the program.  She has made the decision to just let things go and determine how important things really are.  Her new mantra: "Will it still be important a year from now?" Rehab hasgiven her more motivation to get out and about and to make herself better.  Counselor follow up with Linda Yang today reporting she is having a stress test today so is taking it easy in class.  She reports sleeping well and having more energy since coming into this class.  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  Counselor discussed ways to approach this and getting out daily for a few minutes no matter what is one of the ways.  Also speaking to her Dr. about either light therapy or possibly an SSRI as needed.  Linda Yang agreed to do so in order to improve her quality of life.   Linda Yang did not last long enough on her stress test to make it diagnostic.  She is stressed by her busy schedule.  She is having a hard time saying no to others.  I gave her the handout from the stress class again.  She feels like she needs a break and plans to take a vacation after rehab is over.  She is upset by her messy house from their multiple moves in the last few years.  She even admitted to dreading getting up first thing in the  morning, but then once she is up she is ready to take on her tasks for the day!     Expected Outcomes Short: Continue to have a positive attitude and cope with stress.  Long: Continue to work on coping with stress positively. Linda Yang will check with her Dr. about light therapy or possibly an SSRI to help her through her Seasonal Affective disorder for 6 months every year.  Staff will follow with her.  Short: Work on saying no.  Long: Talk to doctor about SSRI per [facility] suggestion.      Interventions Encouraged to attend Cardiac Rehabilitation for the exercise;Stress management education Stress management education Encouraged to attend Cardiac Rehabilitation for the exercise;Stress management education     Continue Psychosocial Services  Follow up required by staff Follow up required by staff Follow up required by staff       Initial Review   Source of Stress Concerns Chronic Illness;Family;Financial  - Chronic Illness;Family;Financial        Psychosocial Discharge (Final Psychosocial Re-Evaluation):     Psychosocial Re-Evaluation - 11/08/16 1136      Psychosocial Re-Evaluation   Current issues with Current Stress Concerns   Comments Linda Yang did not last long enough on her stress test to make it diagnostic.  She is stressed by her busy schedule.  She is having a hard time saying no to others.  I gave her the handout from the stress class again.  She feels like she needs a break and plans to take a vacation after rehab is over.  She is upset by her messy house from their multiple moves in the last few years.  She even admitted to dreading getting up first thing in the morning, but then once she is up she is ready to take on her tasks for the day!   Expected Outcomes Short: Work on saying no.  Long: Talk to doctor about SSRI per [facility] suggestion.    Interventions Encouraged to attend Cardiac Rehabilitation for the exercise;Stress management education   Continue Psychosocial Services  Follow up  required by staff     Initial Review   Source of Stress Concerns Chronic Illness;Family;Financial      Vocational Rehabilitation: Provide vocational rehab assistance to qualifying candidates.   Vocational Rehab Evaluation & Intervention:     Vocational Rehab - 08/30/16 1426      Initial Vocational Rehab Evaluation & Intervention   Assessment shows need for Vocational Rehabilitation No      Education: Education Goals: Education classes will be provided on a variety of topics geared toward better understanding of heart  health and risk factor modification. Participant will state understanding/return demonstration of topics presented as noted by education test scores.  Learning Barriers/Preferences:     Learning Barriers/Preferences - 08/30/16 1426      Learning Barriers/Preferences   Learning Barriers Hearing   Learning Preferences Individual Instruction      Education Topics: General Nutrition Guidelines/Fats and Fiber: -Group instruction provided by verbal, written material, models and posters to present the general guidelines for heart healthy nutrition. Gives an explanation and review of dietary fats and fiber.   Cardiac Rehab from 12/18/2016 in [facility] Cardiac and Pulmonary Rehab  Date  12/04/16  Educator  PI  Instruction Review Code  1- Verbalizes Understanding      Controlling Sodium/Reading Food Labels: -Group verbal and written material supporting the discussion of sodium use in heart healthy nutrition. Review and explanation with models, verbal and written materials for utilization of the food label.   Cardiac Rehab from 12/18/2016 in [facility] Cardiac and Pulmonary Rehab  Date  12/11/16  Educator  CR  Instruction Review Code  1- Verbalizes Understanding      Exercise Physiology & Risk Factors: - Group verbal and written instruction with models to review the exercise physiology of the cardiovascular system and associated critical values. Details cardiovascular  disease risk factors and the goals associated with each risk factor.   Cardiac Rehab from 12/18/2016 in [facility] Cardiac and Pulmonary Rehab  Date  12/18/16  Educator  [facility]  Instruction Review Code  1- Verbalizes Understanding      Aerobic Exercise & Resistance Training: - Gives group verbal and written discussion on the health impact of inactivity. On the components of aerobic and resistive training programs and the benefits of this training and how to safely progress through these programs.   Flexibility, Balance, General Exercise Guidelines: - Provides group verbal and written instruction on the benefits of flexibility and balance training programs. Provides general exercise guidelines with specific guidelines to those with heart or lung disease. Demonstration and skill practice provided.   Cardiac Rehab from 12/18/2016 in [facility] Cardiac and Pulmonary Rehab  Date  11/01/16  Educator  AS  Instruction Review Code  1- Verbalizes Understanding      Stress Management: - Provides group verbal and written instruction about the health risks of elevated stress, cause of high stress, and healthy ways to reduce stress.   Cardiac Rehab from 12/18/2016 in [facility] Cardiac and Pulmonary Rehab  Date  09/11/16  Educator  [place]      Depression: - Provides group verbal and written instruction on the correlation between heart/lung disease and depressed mood, treatment options, and the stigmas associated with seeking treatment.   Cardiac Rehab from 12/18/2016 in [facility] Cardiac and Pulmonary Rehab  Date  11/27/16  Educator  [facility]  Instruction Review Code (retired)  2- meets goals/outcomes  Instruction Review Code  1- Actuary & Physiology of the Heart: - Group verbal and written instruction and models provide basic cardiac anatomy and physiology, with the coronary electrical and arterial systems. Review of: AMI, Angina, Valve disease, Heart Failure, Cardiac Arrhythmia, Pacemakers, and  the ICD.   Cardiac Rehab from 12/18/2016 in [facility] Cardiac and Pulmonary Rehab  Date  11/08/16  Educator  [facility]  Instruction Review Code  1- Verbalizes Understanding      Cardiac Procedures: - Group verbal and written instruction to review commonly prescribed medications for heart disease. Reviews the medication, class of the drug, and side effects. Includes  the steps to properly store meds and maintain the prescription regimen. (beta blockers and nitrates)   Cardiac Medications I: - Group verbal and written instruction to review commonly prescribed medications for heart disease. Reviews the medication, class of the drug, and side effects. Includes the steps to properly store meds and maintain the prescription regimen.   Cardiac Rehab from 12/18/2016 in [facility] Cardiac and Pulmonary Rehab  Date  09/25/16 [8/7 Part One]  Educator  SB      Cardiac Medications II: -Group verbal and written instruction to review commonly prescribed medications for heart disease. Reviews the medication, class of the drug, and side effects. (all other drug classes)    Go Sex-Intimacy & Heart Disease, Get SMART - Goal Setting: - Group verbal and written instruction through game format to discuss heart disease and the return to sexual intimacy. Provides group verbal and written material to discuss and apply goal setting through the application of the S.M.A.R.T. Method.   Other Matters of the Heart: - Provides group verbal, written materials and models to describe Heart Failure, Angina, Valve Disease, Peripheral Artery Disease, and Diabetes in the realm of heart disease. Includes description of the disease process and treatment options available to the cardiac patient.   Cardiac Rehab from 12/18/2016 in [facility] Cardiac and Pulmonary Rehab  Date  11/08/16  Educator  [facility]  Instruction Review Code  1- Verbalizes Understanding      Exercise & Equipment Safety: - Individual verbal instruction and demonstration of  equipment use and safety with use of the equipment.   Cardiac Rehab from 12/18/2016 in [facility] Cardiac and Pulmonary Rehab  Date  08/30/16  Educator  SB      Infection Prevention: - Provides verbal and written material to individual with discussion of infection control including proper hand washing and proper equipment cleaning during exercise session.   Cardiac Rehab from 12/18/2016 in [facility] Cardiac and Pulmonary Rehab  Date  08/30/16  Educator  SB      Falls Prevention: - Provides verbal and written material to individual with discussion of falls prevention and safety.   Cardiac Rehab from 12/18/2016 in [facility] Cardiac and Pulmonary Rehab  Date  08/30/16  Educator  Sb  Instruction Review Code (retired)  2- meets goals/outcomes      Diabetes: - Individual verbal and written instruction to review signs/symptoms of diabetes, desired ranges of glucose level fasting, after meals and with exercise. Acknowledge that pre and post exercise glucose checks will be done for 3 sessions at entry of program.   Other: -Provides group and verbal instruction on various topics (see comments)    Knowledge Questionnaire Score:     Knowledge Questionnaire Score - 12/18/16 1041      Knowledge Questionnaire Score   Pre Score (P)  20/28   Post Score (P)  20/28      Core Components/Risk Factors/Patient Goals at Admission:     Personal Goals and Risk Factors at Admission - 08/30/16 1429      Core Components/Risk Factors/Patient Goals on Admission   Hypertension Yes   Intervention Provide education on lifestyle modifcations including regular physical activity/exercise, weight management, moderate sodium restriction and increased consumption of fresh fruit, vegetables, and low fat dairy, alcohol moderation, and smoking cessation.;Monitor prescription use compliance.   Expected Outcomes Short Term: Continued assessment and intervention until BP is < 140/45m HG in hypertensive participants. <  130/852mHG in hypertensive participants with diabetes, heart failure or chronic kidney disease.;Long Term: Maintenance of blood pressure  at goal levels.   Lipids Yes   Intervention Provide education and support for participant on nutrition & aerobic/resistive exercise along with prescribed medications to achieve LDL <42m, HDL >410m   Expected Outcomes Short Term: Participant states understanding of desired cholesterol values and is compliant with medications prescribed. Participant is following exercise prescription and nutrition guidelines.;Long Term: Cholesterol controlled with medications as prescribed, with individualized exercise RX and with personalized nutrition plan. Value goals: LDL < 7088mHDL > 40 mg.   Stress Yes   Intervention Offer individual and/or small group education and counseling on adjustment to heart disease, stress management and health-related lifestyle change. Teach and support self-help strategies.;Refer participants experiencing significant psychosocial distress to appropriate mental health specialists for further evaluation and treatment. When possible, include family members and significant others in education/counseling sessions.   Expected Outcomes Short Term: Participant demonstrates changes in health-related behavior, relaxation and other stress management skills, ability to obtain effective social support, and compliance with psychotropic medications if prescribed.;Long Term: Emotional wellbeing is indicated by absence of clinically significant psychosocial distress or social isolation.      Core Components/Risk Factors/Patient Goals Review:      Goals and Risk Factor Review    Row Name 09/20/16 0957 10/16/16 1122 11/08/16 1128         Core Components/Risk Factors/Patient Goals Review   Personal Goals Review Weight Management/Obesity;Hypertension;Lipids;Stress Weight Management/Obesity;Stress Weight Management/Obesity;Hypertension;Lipids     Review Linda Yang's  weight has started to come back down.  She has cut back on sugar and eating better. She is now more motivated to get up and get out of the house and be healthier.  Linda Yang's blood pressures have been stable.  She has been doing well on her medications.  She is getting better with her stress levels as well. Linda Yang states she has not started walking at home.  She plans to walk at her grandsons gym when he opens.  Her weight is about the same.  She did meet with the RD and is trying to buy and prepare fresh foods and not boxed foods.  She states she knows what to do and also needs to control portion sizes.  She deals with stress by exercising.  Her main stress has been that her dog is sick and one grandson is going into the [employer] Corp[employer] is going to the gym twice a week.  Her weight was up today as she had chocolate cake for dessert.  She knows she needs to watch her portions and make smart decisions.  Her blood pressures have been good.  Her meds are working for her.     Expected Outcomes Short: Continue to work on weight loss.  Long: Continue to work on risk factor modification.  Short - Linda Yang will begin adding one day per wek walking in addition to HT.  Long - Linda Yang will continue exercising oon her own and will implement healthier dietary choices. Short: Linda Yang will continue to work on weight loss.  Long: Continue to make healthier dietary choices.         Core Components/Risk Factors/Patient Goals at Discharge (Final Review):      Goals and Risk Factor Review - 11/08/16 1128      Core Components/Risk Factors/Patient Goals Review   Personal Goals Review Weight Management/Obesity;Hypertension;Lipids   Review Linda Yang is going to the gym twice a week.  Her weight was up today as she had chocolate cake for dessert.  She knows she needs to watch her portions and  make smart decisions.  Her blood pressures have been good.  Her meds are working for her.   Expected Outcomes Short: Linda Yang will continue to work on  weight loss.  Long: Continue to make healthier dietary choices.       ITP Comments:     ITP Comments    Row Name 08/30/16 1409 09/05/16 0718 09/20/16 0913 10/03/16 0621 10/12/16 1426   ITP Comments Medical review completed Initial ITP created.  Diagnosis documentation can be found [facility] encounter 07/10/2016 30 day review. Continue with ITP unless directed changes per Medical Director review    New to Program (P)  Linda Yang will be scheduling a surgery for her cataracts and possibly for bladder tacking.  She will keep [place] up to date for dates of surgery and what the doctor says.  She may discharge prior to surgery and return once cleared after both surgiers. 30 day review. Continue with ITP unless directed changes per Medical Director review 30 day review.  Linda Yang may have gotten scratched by a bat a home on Monday night.  She has talked with her doctor and the health department about it.  No one had examined her yet, but it was recommended that she be treated for rabies.   Pablo Pena Name 10/16/16 1156 10/18/16 1043 10/23/16 1001 10/31/16 1119 11/28/16 0644   ITP Comments Linda Yang had someone come and check their house and no evidence of bats was found.  She has chosen not to go through with rabies vaccination with this information. On the TM at her normal speed Linda Yang began having multiple PVC's resulting in trigeminy. She was asymptomatic but stated she had been feeling more tired than normal at home. Her trigeminy and frequent PVC's resolved once speed was decreased and did not re-occur during her session. Dr. Etta Quill office was made aware and session info adn strip print out was faxed over to his office. She states she has an appointment with him next week.  Linda Yang had palpitations over the weekend and took a nitro and they went away.  She sees her PCP tomorrow and cardiologist Friday.  Staff recommended she speak with both about the palpitations. 30 day review. Continue with ITP unless directed changes per Medical  Director review.   30 day Review. Continue with ITP unless directed changes per Medical Director Review.    Row Name 12/18/16 1127           ITP Comments Linda Yang graduated today.  Discharge ITP created and summary sent          Comments: Discharge ITP

## 2016-12-19 ENCOUNTER — Other Ambulatory Visit: Payer: Self-pay | Admitting: Obstetrics and Gynecology

## 2016-12-19 DIAGNOSIS — N951 Menopausal and female climacteric states: Secondary | ICD-10-CM | POA: Diagnosis not present

## 2016-12-19 DIAGNOSIS — N3946 Mixed incontinence: Secondary | ICD-10-CM | POA: Diagnosis not present

## 2016-12-19 DIAGNOSIS — Z1231 Encounter for screening mammogram for malignant neoplasm of breast: Secondary | ICD-10-CM

## 2016-12-26 DIAGNOSIS — H2511 Age-related nuclear cataract, right eye: Secondary | ICD-10-CM | POA: Diagnosis not present

## 2017-01-09 ENCOUNTER — Ambulatory Visit
Admission: RE | Admit: 2017-01-09 | Discharge: 2017-01-09 | Disposition: A | Discharge status: Home / Self Care | Payer: Medicare HMO | Source: Ambulatory Visit | Attending: Obstetrics and Gynecology | Admitting: Obstetrics and Gynecology

## 2017-01-09 DIAGNOSIS — Z1231 Encounter for screening mammogram for malignant neoplasm of breast: Secondary | ICD-10-CM | POA: Insufficient documentation

## 2017-01-09 DIAGNOSIS — R928 Other abnormal and inconclusive findings on diagnostic imaging of breast: Secondary | ICD-10-CM | POA: Insufficient documentation

## 2017-01-14 DIAGNOSIS — H2512 Age-related nuclear cataract, left eye: Secondary | ICD-10-CM | POA: Diagnosis not present

## 2017-01-16 ENCOUNTER — Encounter: Payer: Self-pay | Admitting: *Deleted

## 2017-01-16 ENCOUNTER — Other Ambulatory Visit: Payer: Self-pay | Admitting: Obstetrics and Gynecology

## 2017-01-16 DIAGNOSIS — N6489 Other specified disorders of breast: Secondary | ICD-10-CM

## 2017-01-16 DIAGNOSIS — R928 Other abnormal and inconclusive findings on diagnostic imaging of breast: Secondary | ICD-10-CM

## 2017-01-20 ENCOUNTER — Encounter: Payer: Self-pay | Admitting: Emergency Medicine

## 2017-01-20 ENCOUNTER — Other Ambulatory Visit: Payer: Self-pay

## 2017-01-20 ENCOUNTER — Emergency Department
Admission: EM | Admit: 2017-01-20 | Discharge: 2017-01-20 | Disposition: A | Discharge status: Home / Self Care | Payer: Medicare HMO | Attending: Emergency Medicine | Admitting: Emergency Medicine

## 2017-01-20 DIAGNOSIS — Z79899 Other long term (current) drug therapy: Secondary | ICD-10-CM | POA: Diagnosis not present

## 2017-01-20 DIAGNOSIS — Z7982 Long term (current) use of aspirin: Secondary | ICD-10-CM | POA: Insufficient documentation

## 2017-01-20 DIAGNOSIS — Z7902 Long term (current) use of antithrombotics/antiplatelets: Secondary | ICD-10-CM | POA: Insufficient documentation

## 2017-01-20 DIAGNOSIS — I2510 Atherosclerotic heart disease of native coronary artery without angina pectoris: Secondary | ICD-10-CM | POA: Diagnosis not present

## 2017-01-20 DIAGNOSIS — K047 Periapical abscess without sinus: Secondary | ICD-10-CM | POA: Diagnosis not present

## 2017-01-20 DIAGNOSIS — I252 Old myocardial infarction: Secondary | ICD-10-CM | POA: Diagnosis not present

## 2017-01-20 DIAGNOSIS — R220 Localized swelling, mass and lump, head: Secondary | ICD-10-CM | POA: Diagnosis present

## 2017-01-20 DIAGNOSIS — I10 Essential (primary) hypertension: Secondary | ICD-10-CM | POA: Diagnosis not present

## 2017-01-20 LAB — CBC WITH DIFFERENTIAL/PLATELET
Basophils Absolute: 0 10*3/uL (ref 0–0.1)
Basophils Relative: 0 %
Eosinophils Absolute: 0.1 10*3/uL (ref 0–0.7)
Eosinophils Relative: 1 %
HEMATOCRIT: 44.3 % (ref 35.0–47.0)
HEMOGLOBIN: 14.8 g/dL (ref 12.0–16.0)
LYMPHS ABS: 2.1 10*3/uL (ref 1.0–3.6)
LYMPHS PCT: 22 %
MCH: 31.8 pg (ref 26.0–34.0)
MCHC: 33.5 g/dL (ref 32.0–36.0)
MCV: 94.9 fL (ref 80.0–100.0)
Monocytes Absolute: 1.3 10*3/uL — ABNORMAL HIGH (ref 0.2–0.9)
Monocytes Relative: 14 %
NEUTROS ABS: 6.1 10*3/uL (ref 1.4–6.5)
NEUTROS PCT: 63 %
Platelets: 291 10*3/uL (ref 150–440)
RBC: 4.67 MIL/uL (ref 3.80–5.20)
RDW: 13.3 % (ref 11.5–14.5)
WBC: 9.6 10*3/uL (ref 3.6–11.0)

## 2017-01-20 LAB — BASIC METABOLIC PANEL
Anion gap: 9 (ref 5–15)
BUN: 10 mg/dL (ref 6–20)
CHLORIDE: 104 mmol/L (ref 101–111)
CO2: 26 mmol/L (ref 22–32)
Calcium: 9 mg/dL (ref 8.9–10.3)
Creatinine, Ser: 0.63 mg/dL (ref 0.44–1.00)
GFR calc Af Amer: >60 mL/min (ref >60)
GFR calc non Af Amer: >60 mL/min (ref >60)
GLUCOSE: 108 mg/dL — AB (ref 65–99)
POTASSIUM: 4.5 mmol/L (ref 3.5–5.1)
SODIUM: 139 mmol/L (ref 135–145)

## 2017-01-20 MED ORDER — BUPIVACAINE HCL 0.5 % IJ SOLN
10.0000 mL | Freq: Once | INTRAMUSCULAR | Status: DC
  Filled 2017-01-20: qty 10

## 2017-01-20 NOTE — ED Triage Notes (Signed)
Abscess tooth on left side of face. Placed on amoxil by dentist yesterday. Pt feels it is worse.

## 2017-01-20 NOTE — Discharge Instructions (Signed)
Please continue taking your antibiotics as prescribed and follow-up with your dentist tomorrow for recheck.  Return to the emergency department sooner for any concerns whatsoever.  It was a pleasure to take care of you today, and thank you for coming to our emergency department.  If you have any questions or concerns before leaving please ask the nurse to grab me and I'm more than happy to go through your aftercare instructions again.  If you were prescribed any opioid pain medication today such as Norco, Vicodin, Percocet, morphine, hydrocodone, or oxycodone please make sure you do not drive when you are taking this medication as it can alter your ability to drive safely.  If you have any concerns once you are home that you are not improving or are in fact getting worse before you can make it to your follow-up appointment, please do not hesitate to call 911 and come back for further evaluation.  Darel Hong, MD

## 2017-01-20 NOTE — ED Notes (Signed)
NAD noted at time of D/C. Pt denies questions or concerns. Pt ambulatory to the lobby at this time.  

## 2017-01-20 NOTE — ED Provider Notes (Signed)
[facility] Emergency Department Provider Note  ____________________________________________   First MD Initiated Contact with Patient 01/20/17 1357     (approximate)  I have reviewed the triage vital signs and the nursing notes.   HISTORY  Chief Complaint Abscess    HPI Linda Yang is a 76 y.o. female who comes to the emergency department with 4 days of left upper dental discomfort.  Yesterday she went to her dentist who prescribed her amoxicillin and she is taken a total of 3 doses.  Today she noted worsening swelling on the left side of her face her dentist advised her to come to the emergency department to evaluate for an abscess.  The patient has no fevers or chills.  No difficulty swallowing.  No change in her voice.  No drooling.  She is not a diabetic.  Pain is moderate severity aching constant.  It began insidiously and has been constant ever since.  Nothing seems to make it better or worse.  Past Medical History:  Diagnosis Date  . Anginal pain ([place])    occas. took ntg last week  . Anxiety   . Complication of anesthesia   . Coronary artery disease   . Dyspnea    doe  . High cholesterol   . Myocardial infarction ([place])    5/18  . PONV (postoperative nausea and vomiting)     Patient Active Problem List   Diagnosis Date Noted  . 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 07/19/2016  . CVA 07/08/2009  . GOUT, UNSPECIFIED 07/06/2009  . HYPERTENSION 07/06/2009  . PALPITATIONS 07/06/2009    Past Surgical History:  Procedure Laterality Date  . ABDOMINAL HYSTERECTOMY    . ACHILLES TENDON SURGERY    . CHOLECYSTECTOMY    . CORONARY ANGIOPLASTY     STENT 5/18  . CORONARY STENT INTERVENTION N/A 07/18/2016   Procedure: Coronary Stent Intervention;  Surgeon: Yolonda Kida, MD;  Location: [place] [facility];  Service: Cardiovascular;  Laterality: N/A;  . CTR    . INTRAVASCULAR PRESSURE WIRE/FFR STUDY N/A 07/18/2016   Procedure: Intravascular Pressure Wire/FFR Study;  Surgeon: Yolonda Kida, MD;  Location: [place] [facility];  Service: Cardiovascular;  Laterality: N/A;  . LEFT HEART CATH AND CORONARY ANGIOGRAPHY N/A 07/18/2016   Procedure: Left Heart Cath and Coronary Angiography;  Surgeon: Yolonda Kida, MD;  Location: [place] [facility];  Service: Cardiovascular;  Laterality: N/A;    Prior to Admission medications   Medication Sig Start Date End Date Taking? Authorizing Provider  aspirin EC 81 MG tablet Take 81 mg by mouth at bedtime.    [provider]  atorvastatin (LIPITOR) 40 MG tablet Take by mouth. 08/02/16 08/02/17  [provider]  Calcium Carbonate-Simethicone (ALKA-SELTZER HEARTBURN + GAS) 750-80 MG CHEW Chew 1 tablet by mouth daily as needed (heartburn).    [provider]  Camphor-Menthol-Capsicum (TIGER BALM PAIN RELIEVING) 80-24-16 MG PTCH Apply 1-3 patches topically daily as needed (pain).    [provider]  cholecalciferol (VITAMIN D) 400 units TABS tablet Take 400 Units by mouth at bedtime.    [provider]  clopidogrel (PLAVIX) 75 MG tablet Take 75 mg by mouth daily.    [provider]  ferrous sulfate 325 (65 FE) MG tablet Take 325 mg by mouth at bedtime.    [provider]  fexofenadine (ALLEGRA) 180 MG tablet Take 180 mg by mouth at bedtime as needed for allergies or  rhinitis.    [provider]  HYDROcodone-acetaminophen (NORCO/VICODIN) 5-325 MG tablet Take 1 tablet by mouth daily as needed for moderate pain.    [provider]  isosorbide mononitrate (IMDUR) 30 MG 24 hr tablet Take 30 mg by mouth daily.    [provider]  metoprolol succinate (TOPROL-XL) 25 MG 24 hr tablet Take 25 mg by mouth 2 (two) times daily. 1/2 tab am 1 tab pm 08/02/16 08/02/17  [provider]  mirabegron ER (MYRBETRIQ) 50 MG TB24 tablet Take 50 mg by mouth daily.    [provider]    nitroGLYCERIN (NITROSTAT) 0.4 MG SL tablet Place 0.4 mg under the tongue every 5 (five) minutes as needed for chest pain.  07/06/16   [provider]  PARoxetine (PAXIL) 20 MG tablet Take 20 mg by mouth at bedtime.    [provider]  ticagrelor (BRILINTA) 90 MG TABS tablet Take 1 tablet (90 mg total) by mouth 2 (two) times daily. 07/19/16 08/18/16  Callwood, Karma Greaser D, MD  trolamine salicylate (ASPERCREME) 10 % cream Apply 1 application topically as needed for muscle pain.    [provider]  vitamin B-12 (CYANOCOBALAMIN) 1000 MCG tablet Take 1,000 mcg by mouth at bedtime.    [provider]  Vitamin D, Ergocalciferol, (DRISDOL) 50000 units CAPS capsule Take 50,000 Units by mouth every Thursday.    [provider]    Allergies Metoprolol; Other; Red yeast rice [cholestin]; and Statins  Family History  Problem Relation Age of Onset  . Breast cancer Paternal Aunt        post men.     Social History Social History   Tobacco Use  . Smoking status: Never Smoker  . Smokeless tobacco: Never Used  Substance Use Topics  . Alcohol use: No  . Drug use: No    Review of Systems Constitutional: No fever/chills ENT: No sore throat. Cardiovascular: Denies chest pain. Respiratory: Denies shortness of breath. Gastrointestinal: No abdominal pain.  No nausea, no vomiting.  No diarrhea.  No constipation. Musculoskeletal: Negative for back pain. Neurological: Negative for headaches   ____________________________________________   PHYSICAL EXAM:  VITAL SIGNS: ED Triage Vitals  Enc Vitals Group     BP 01/20/17 1248 131/76     Pulse Rate 01/20/17 1248 62     Resp 01/20/17 1248 16     Temp 01/20/17 1248 99.5 F (37.5 C)     Temp Source 01/20/17 1248 Oral     SpO2 01/20/17 1248 93 %     Weight 01/20/17 1249 204 lb (92.5 kg)     Height 01/20/17 1249 5\' 2"  (1.575 m)     Head Circumference --      Peak Flow --      Pain Score 01/20/17 1248 2      Pain Loc --      Pain Edu? --      Excl. in [place]? --     Constitutional: Alert and oriented x4 well-appearing nontoxic no diaphoresis speaks full clear sentences Head: Atraumatic. Nose: No congestion/rhinnorhea. Mouth/Throat: No trismus.  No sublingual induration.  Left upper tooth with small cyst visible just at the gumline Neck: No stridor.   Cardiovascular: Regular rate and rhythm Respiratory: Normal respiratory effort.  No retractions.  Neurologic:  Normal speech and language. No gross focal neurologic deficits are appreciated.  Skin:  Skin is warm, dry and intact. No rash noted.    ____________________________________________  LABS (all labs ordered are listed, but  only abnormal results are displayed)  Labs Reviewed  CBC WITH DIFFERENTIAL/PLATELET - Abnormal; Notable for the following components:      Result Value   Monocytes Absolute 1.3 (*)    All other components within normal limits  BASIC METABOLIC PANEL - Abnormal; Notable for the following components:   Glucose, Bld 108 (*)    All other components within normal limits    Blood work reviewed by me with no acute disease __________________________________________  EKG   ____________________________________________  RADIOLOGY   ____________________________________________   DIFFERENTIAL includes but not limited to  Pericoronitis, periapical abscess, retropharyngeal abscess, canine abscess, buccal abscess   PROCEDURES  Procedure(s) performed: yes  .Marland KitchenIncision and Drainage Date/Time: 01/20/2017 2:09 PM Performed by: Darel Hong, MD Authorized by: Darel Hong, MD   Consent:    Consent obtained:  Verbal   Consent given by:  Patient   Risks discussed:  Bleeding, incomplete drainage and pain   Alternatives discussed:  No treatment Location:    Type:  Abscess   Location:  Mouth Anesthesia (see MAR for exact dosages):    Anesthesia method:  None Procedure type:    Complexity:   Simple Procedure details:    Incision types:  Stab incision   Incision depth:  Submucosal   Scalpel blade:  11   Drainage:  Purulent   Drainage amount:  Scant   Wound treatment:  Wound left open   Packing materials:  None Post-procedure details:    Patient tolerance of procedure:  Tolerated well, no immediate complications Comments:     I made a stab incision over a left frontal dental abscess expressing a small amount of purulent material.  The patient tolerated procedure well    Critical Care performed: no  Observation: no ____________________________________________   INITIAL IMPRESSION / ASSESSMENT AND PLAN / ED COURSE  Pertinent labs & imaging results that were available during my care of the patient were reviewed by me and considered in my medical decision making (see chart for details).  I offered the patient a dental block prior to incision and drainage of the abscess, referring stab incision without pain medication.  Using 11 blade and made a simple stab incision expressing a small amount of purulent material.  Patient has no signs of posterior involvement she has no white count and is able to eat and drink without difficulty.  She already has amoxicillin and follow-up with her dentist tomorrow morning.  Patient's daughter was texting the patient's dentist throughout the procedure and I texted him what happened today and he agreed.  Patient is discharged home in improved condition verbalizes understanding and agreement the plan.      ____________________________________________   FINAL CLINICAL IMPRESSION(S) / ED DIAGNOSES  Final diagnoses:  Dental abscess      NEW MEDICATIONS STARTED DURING THIS VISIT:  This SmartLink is deprecated. Use AVSMEDLIST instead to display the medication list for a patient.   Note:  This document was prepared using Dragon voice recognition software and may include unintentional dictation errors.      Darel Hong,  MD 01/20/17 [phone]

## 2017-01-21 DIAGNOSIS — M5416 Radiculopathy, lumbar region: Secondary | ICD-10-CM | POA: Diagnosis not present

## 2017-01-21 DIAGNOSIS — M170 Bilateral primary osteoarthritis of knee: Secondary | ICD-10-CM | POA: Diagnosis not present

## 2017-01-21 DIAGNOSIS — M5126 Other intervertebral disc displacement, lumbar region: Secondary | ICD-10-CM | POA: Diagnosis not present

## 2017-01-22 ENCOUNTER — Ambulatory Visit
Admission: RE | Admit: 2017-01-22 | Discharge: 2017-01-22 | Disposition: A | Discharge status: Home / Self Care | Payer: Medicare HMO | Source: Ambulatory Visit | Attending: Obstetrics and Gynecology | Admitting: Obstetrics and Gynecology

## 2017-01-22 DIAGNOSIS — R928 Other abnormal and inconclusive findings on diagnostic imaging of breast: Secondary | ICD-10-CM

## 2017-01-22 DIAGNOSIS — N6489 Other specified disorders of breast: Secondary | ICD-10-CM

## 2017-01-22 DIAGNOSIS — N6001 Solitary cyst of right breast: Secondary | ICD-10-CM | POA: Diagnosis not present

## 2017-01-22 DIAGNOSIS — N6311 Unspecified lump in the right breast, upper outer quadrant: Secondary | ICD-10-CM | POA: Diagnosis not present

## 2017-01-29 ENCOUNTER — Other Ambulatory Visit: Payer: Self-pay

## 2017-01-29 ENCOUNTER — Ambulatory Visit: Payer: Medicare HMO | Admitting: Registered Nurse

## 2017-01-29 ENCOUNTER — Encounter
Admission: RE | Disposition: A | Discharge status: Home / Self Care | Payer: Self-pay | Source: Ambulatory Visit | Attending: Ophthalmology

## 2017-01-29 ENCOUNTER — Ambulatory Visit
Admission: RE | Admit: 2017-01-29 | Discharge: 2017-01-29 | Disposition: A | Discharge status: Home / Self Care | Payer: Medicare HMO | Source: Ambulatory Visit | Attending: Ophthalmology | Admitting: Ophthalmology

## 2017-01-29 ENCOUNTER — Other Ambulatory Visit: Payer: Self-pay | Admitting: Obstetrics and Gynecology

## 2017-01-29 DIAGNOSIS — Z955 Presence of coronary angioplasty implant and graft: Secondary | ICD-10-CM | POA: Insufficient documentation

## 2017-01-29 DIAGNOSIS — I739 Peripheral vascular disease, unspecified: Secondary | ICD-10-CM | POA: Diagnosis not present

## 2017-01-29 DIAGNOSIS — I10 Essential (primary) hypertension: Secondary | ICD-10-CM | POA: Insufficient documentation

## 2017-01-29 DIAGNOSIS — Z881 Allergy status to other antibiotic agents status: Secondary | ICD-10-CM | POA: Diagnosis not present

## 2017-01-29 DIAGNOSIS — H2512 Age-related nuclear cataract, left eye: Secondary | ICD-10-CM | POA: Diagnosis not present

## 2017-01-29 DIAGNOSIS — I252 Old myocardial infarction: Secondary | ICD-10-CM | POA: Insufficient documentation

## 2017-01-29 DIAGNOSIS — Z7982 Long term (current) use of aspirin: Secondary | ICD-10-CM | POA: Insufficient documentation

## 2017-01-29 DIAGNOSIS — Z9889 Other specified postprocedural states: Secondary | ICD-10-CM | POA: Diagnosis not present

## 2017-01-29 DIAGNOSIS — Z90710 Acquired absence of both cervix and uterus: Secondary | ICD-10-CM | POA: Diagnosis not present

## 2017-01-29 DIAGNOSIS — Z6837 Body mass index (BMI) 37.0-37.9, adult: Secondary | ICD-10-CM | POA: Insufficient documentation

## 2017-01-29 DIAGNOSIS — I209 Angina pectoris, unspecified: Secondary | ICD-10-CM | POA: Insufficient documentation

## 2017-01-29 DIAGNOSIS — Z79899 Other long term (current) drug therapy: Secondary | ICD-10-CM | POA: Diagnosis not present

## 2017-01-29 DIAGNOSIS — Z8673 Personal history of transient ischemic attack (TIA), and cerebral infarction without residual deficits: Secondary | ICD-10-CM | POA: Diagnosis not present

## 2017-01-29 DIAGNOSIS — R928 Other abnormal and inconclusive findings on diagnostic imaging of breast: Secondary | ICD-10-CM

## 2017-01-29 DIAGNOSIS — F419 Anxiety disorder, unspecified: Secondary | ICD-10-CM | POA: Insufficient documentation

## 2017-01-29 DIAGNOSIS — H2511 Age-related nuclear cataract, right eye: Secondary | ICD-10-CM | POA: Diagnosis not present

## 2017-01-29 DIAGNOSIS — E7800 Pure hypercholesterolemia, unspecified: Secondary | ICD-10-CM | POA: Insufficient documentation

## 2017-01-29 DIAGNOSIS — I2510 Atherosclerotic heart disease of native coronary artery without angina pectoris: Secondary | ICD-10-CM | POA: Diagnosis not present

## 2017-01-29 DIAGNOSIS — N6310 Unspecified lump in the right breast, unspecified quadrant: Secondary | ICD-10-CM

## 2017-01-29 HISTORY — DX: Dyspnea, unspecified: R06.00

## 2017-01-29 HISTORY — DX: Other complications of anesthesia, initial encounter: T88.59XA

## 2017-01-29 HISTORY — DX: Acute myocardial infarction, unspecified: I21.9

## 2017-01-29 HISTORY — DX: Adverse effect of unspecified anesthetic, initial encounter: T41.45XA

## 2017-01-29 HISTORY — DX: Other specified postprocedural states: Z98.890

## 2017-01-29 HISTORY — PX: CATARACT EXTRACTION W/PHACO: SHX586

## 2017-01-29 HISTORY — DX: Anxiety disorder, unspecified: F41.9

## 2017-01-29 HISTORY — DX: Nausea with vomiting, unspecified: R11.2

## 2017-01-29 HISTORY — DX: Angina pectoris, unspecified: I20.9

## 2017-01-29 SURGERY — PHACOEMULSIFICATION, CATARACT, WITH IOL INSERTION
Anesthesia: Monitor Anesthesia Care | Site: Eye | Laterality: Left | Wound class: Clean

## 2017-01-29 MED ORDER — MOXIFLOXACIN HCL 0.5 % OP SOLN
OPHTHALMIC | Status: DC | PRN
  Administered 2017-01-29: 0.2 mL via OPHTHALMIC

## 2017-01-29 MED ORDER — SODIUM CHLORIDE 0.9 % IV SOLN
INTRAVENOUS | Status: DC
  Administered 2017-01-29: 07:00:00 via INTRAVENOUS

## 2017-01-29 MED ORDER — ONDANSETRON HCL 4 MG/2ML IJ SOLN: 4.0000 mg | Freq: Once | INTRAMUSCULAR | Status: DC | PRN

## 2017-01-29 MED ORDER — MIDAZOLAM HCL 2 MG/2ML IJ SOLN
INTRAMUSCULAR | Status: DC | PRN
  Administered 2017-01-29: 1 mg via INTRAVENOUS

## 2017-01-29 MED ORDER — FENTANYL CITRATE (PF) 100 MCG/2ML IJ SOLN
INTRAMUSCULAR | Status: AC
  Filled 2017-01-29: qty 2

## 2017-01-29 MED ORDER — LIDOCAINE HCL (PF) 4 % IJ SOLN
INTRAMUSCULAR | Status: DC | PRN
  Administered 2017-01-29: 4 mL via OPHTHALMIC

## 2017-01-29 MED ORDER — ARMC OPHTHALMIC DILATING DROPS
1.0000 "application " | OPHTHALMIC | Status: DC
  Administered 2017-01-29 (×3): 1 via OPHTHALMIC

## 2017-01-29 MED ORDER — MOXIFLOXACIN HCL 0.5 % OP SOLN: 1.0000 [drp] | OPHTHALMIC | Status: DC | PRN

## 2017-01-29 MED ORDER — POVIDONE-IODINE 5 % OP SOLN
OPHTHALMIC | Status: DC | PRN
  Administered 2017-01-29: 1 via OPHTHALMIC

## 2017-01-29 MED ORDER — NA CHONDROIT SULF-NA HYALURON 40-17 MG/ML IO SOLN
INTRAOCULAR | Status: DC | PRN
  Administered 2017-01-29: 1 mL via INTRAOCULAR

## 2017-01-29 MED ORDER — EPINEPHRINE PF 1 MG/ML IJ SOLN
INTRAOCULAR | Status: DC | PRN
  Administered 2017-01-29: 09:00:00 via OPHTHALMIC

## 2017-01-29 MED ORDER — FENTANYL CITRATE (PF) 100 MCG/2ML IJ SOLN: 25.0000 ug | INTRAMUSCULAR | Status: DC | PRN

## 2017-01-29 MED ORDER — CARBACHOL 0.01 % IO SOLN
INTRAOCULAR | Status: DC | PRN
  Administered 2017-01-29: 0.5 mL via INTRAOCULAR

## 2017-01-29 MED ORDER — MIDAZOLAM HCL 2 MG/2ML IJ SOLN
INTRAMUSCULAR | Status: AC
  Filled 2017-01-29: qty 2

## 2017-01-29 MED ORDER — FENTANYL CITRATE (PF) 100 MCG/2ML IJ SOLN
INTRAMUSCULAR | Status: DC | PRN
  Administered 2017-01-29: 25 ug via INTRAVENOUS

## 2017-01-29 SURGICAL SUPPLY — 16 items
GLOVE BIO SURGEON STRL SZ8 (GLOVE) ×3 IMPLANT
GLOVE BIOGEL M 6.5 STRL (GLOVE) ×3 IMPLANT
GLOVE SURG LX 8.0 MICRO (GLOVE) ×2
GLOVE SURG LX STRL 8.0 MICRO (GLOVE) ×1 IMPLANT
GOWN STRL REUS W/ TWL LRG LVL3 (GOWN DISPOSABLE) ×2 IMPLANT
GOWN STRL REUS W/TWL LRG LVL3 (GOWN DISPOSABLE) ×6
LABEL CATARACT MEDS ST (LABEL) ×3 IMPLANT
LENS IOL TECNIS ITEC 20.0 (Intraocular Lens) ×2 IMPLANT
PACK CATARACT (MISCELLANEOUS) ×3 IMPLANT
PACK CATARACT BRASINGTON LX (MISCELLANEOUS) ×3 IMPLANT
PACK EYE AFTER SURG (MISCELLANEOUS) ×3 IMPLANT
SOL BSS BAG (MISCELLANEOUS) ×3
SOLUTION BSS BAG (MISCELLANEOUS) ×1 IMPLANT
SYR 5ML LL (SYRINGE) ×3 IMPLANT
WATER STERILE IRR 250ML POUR (IV SOLUTION) ×3 IMPLANT
WIPE NON LINTING 3.25X3.25 (MISCELLANEOUS) ×3 IMPLANT

## 2017-01-29 NOTE — H&P (Signed)
All labs reviewed. Abnormal studies sent to patients PCP when indicated.  Previous H&P reviewed, patient examined, there are NO CHANGES.  Linda Yang LOUIS12/11/20188:21 AM

## 2017-01-29 NOTE — Anesthesia Preprocedure Evaluation (Signed)
Anesthesia Evaluation  Patient identified by MRN, date of birth, ID band Patient awake    Reviewed: Allergy & Precautions, NPO status , Patient's Chart, lab work & pertinent test results  History of Anesthesia Complications (+) PONV  Airway Mallampati: II       Dental  (+) Teeth Intact   Pulmonary shortness of breath,    breath sounds clear to auscultation       Cardiovascular Exercise Tolerance: Good hypertension, Pt. on medications and Pt. on home beta blockers + CAD, + Past MI, + Cardiac Stents and + Peripheral Vascular Disease   Rhythm:Regular Rate:Normal     Neuro/Psych Anxiety CVA, No Residual Symptoms    GI/Hepatic negative GI ROS, Neg liver ROS,   Endo/Other  Morbid obesity  Renal/GU negative Renal ROS     Musculoskeletal   Abdominal (+) + obese,   Peds negative pediatric ROS (+)  Hematology negative hematology ROS (+)   Anesthesia Other Findings   Reproductive/Obstetrics                             Anesthesia Physical Anesthesia Plan  ASA: III  Anesthesia Plan: MAC   Post-op Pain Management:    Induction: Intravenous  PONV Risk Score and Plan: Ondansetron  Airway Management Planned: Natural Airway and Nasal Cannula  Additional Equipment:   Intra-op Plan:   Post-operative Plan:   Informed Consent: I have reviewed the patients History and Physical, chart, labs and discussed the procedure including the risks, benefits and alternatives for the proposed anesthesia with the patient or authorized representative who has indicated his/her understanding and acceptance.     Plan Discussed with: CRNA  Anesthesia Plan Comments:         Anesthesia Quick Evaluation

## 2017-01-29 NOTE — Op Note (Signed)
PREOPERATIVE DIAGNOSIS:  Nuclear sclerotic cataract of the left eye.   POSTOPERATIVE DIAGNOSIS:  Nuclear sclerotic cataract of the left eye.   OPERATIVE PROCEDURE: Procedure(s): CATARACT EXTRACTION PHACO AND INTRAOCULAR LENS PLACEMENT (IOC)   SURGEON:  Birder Robson, MD.   ANESTHESIA:  Anesthesiologist: [facility], Jane Canary, MD CRNA: Hedda Slade, CRNA  1.      Managed anesthesia care. 2.     0.98ml of Shugarcaine was instilled following the paracentesis   COMPLICATIONS:  None.   TECHNIQUE:   Stop and chop   DESCRIPTION OF PROCEDURE:  The patient was examined and consented in the preoperative holding area where the aforementioned topical anesthesia was applied to the left eye and then brought back to the Operating Room where the left eye was prepped and draped in the usual sterile ophthalmic fashion and a lid speculum was placed. A paracentesis was created with the side port blade and the anterior chamber was filled with viscoelastic. A near clear corneal incision was performed with the steel keratome. A continuous curvilinear capsulorrhexis was performed with a cystotome followed by the capsulorrhexis forceps. Hydrodissection and hydrodelineation were carried out with BSS on a blunt cannula. The lens was removed in a stop and chop  technique and the remaining cortical material was removed with the irrigation-aspiration handpiece. The capsular bag was inflated with viscoelastic and the Technis [id number] lens was placed in the capsular bag without complication. The remaining viscoelastic was removed from the eye with the irrigation-aspiration handpiece. The wounds were hydrated. The anterior chamber was flushed with Miostat and the eye was inflated to physiologic pressure. 0.10ml Vigamox was placed in the anterior chamber. The wounds were found to be water tight. The eye was dressed with Vigamox. The patient was given protective glasses to wear throughout the day and a shield with which to  sleep tonight. The patient was also given drops with which to begin a drop regimen today and will follow-up with me in one day. Implant Name Type Inv. Item Serial No. Manufacturer Lot No. LRB No. Used  LENS IOL DIOP 20.0 - [id number] Intraocular Lens LENS IOL DIOP 20.0 [id number] AMO  Left 1    Procedure(s) with comments: CATARACT EXTRACTION PHACO AND INTRAOCULAR LENS PLACEMENT (IOC) (Left) - [place] 00:49.9 AP% 20.3 CDE 10.11 Fluid Pack lot # [id number]  Electronically signed: Hartman Minahan LOUIS 01/29/2017 8:51 AM

## 2017-01-29 NOTE — Discharge Instructions (Signed)
Eye Surgery Discharge Instructions  Expect mild scratchy sensation or mild soreness. DO NOT RUB YOUR EYE!  The day of surgery:  Minimal physical activity, but bed rest is not required  No reading, computer work, or close hand work  No bending, lifting, or straining.  May watch TV  For 24 hours:  No driving, legal decisions, or alcoholic beverages  Safety precautions  Eat anything you prefer: It is better to start with liquids, then soup then solid foods.  _____ Eye patch should be worn until postoperative exam tomorrow.  __x__ Solar shield eyeglasses should be worn for comfort in the sunlight/patch while sleeping  Resume all regular medications including aspirin or Coumadin if these were discontinued prior to surgery. You may shower, bathe, shave, or wash your hair. Tylenol may be taken for mild discomfort.  Call your doctor if you experience significant pain, nausea, or vomiting, fever > 101 or other signs of infection. [phone] or [phone] Specific instructions:     Take the one drop that you purchased from the physicians office and take as instructed.  Refer to the hand out sheet for timing.  Follow-up Information    Birder Robson, MD Follow up on 01/30/2017.   Specialty:  Ophthalmology Why:  follow up appointment in the office at 9:55 am. Contact information: [place] [address] [phone]

## 2017-01-29 NOTE — Transfer of Care (Signed)
Immediate Anesthesia Transfer of Care Note  Patient: Linda Yang  Procedure(s) Performed: CATARACT EXTRACTION PHACO AND INTRAOCULAR LENS PLACEMENT (IOC) (Left Eye)  Patient Location: PACU  Anesthesia Type:MAC  Level of Consciousness: awake, alert  and oriented  Airway & Oxygen Therapy: Patient Spontanous Breathing  Post-op Assessment: Report given to RN and Post -op Vital signs reviewed and stable  Post vital signs: Reviewed and stable  Last Vitals:  Vitals:   01/29/17 0709 01/29/17 0850  BP: (!) 145/64 (!) 117/59  Pulse: 60 60  Resp: 18 12  Temp: (!) 35.8 C 36.8 C  SpO2: 97% 99%    Last Pain:  Vitals:   01/29/17 0709  TempSrc: Temporal         Complications: No apparent anesthesia complications

## 2017-01-29 NOTE — Anesthesia Post-op Follow-up Note (Signed)
Anesthesia QCDR form completed.        

## 2017-01-29 NOTE — Anesthesia Postprocedure Evaluation (Signed)
Anesthesia Post Note  Patient: Linda Yang  Procedure(s) Performed: CATARACT EXTRACTION PHACO AND INTRAOCULAR LENS PLACEMENT ([place]) (Left Eye)  Patient location during evaluation: PACU Anesthesia Type: MAC Level of consciousness: awake Pain management: pain level controlled Vital Signs Assessment: post-procedure vital signs reviewed and stable Respiratory status: spontaneous breathing Cardiovascular status: blood pressure returned to baseline Postop Assessment: no headache, no backache, no apparent nausea or vomiting and adequate PO intake Anesthetic complications: no     Last Vitals:  Vitals:   01/29/17 0709 01/29/17 0850  BP: (!) 145/64 (!) 117/59  Pulse: 60 60  Resp: 18 12  Temp: (!) 35.8 C 36.8 C  SpO2: 97% 99%    Last Pain:  Vitals:   01/29/17 0709  TempSrc: Temporal                 Ulmer Degen Lorenza Chick

## 2017-01-30 ENCOUNTER — Encounter: Payer: Self-pay | Admitting: Ophthalmology

## 2017-02-19 DIAGNOSIS — C50919 Malignant neoplasm of unspecified site of unspecified female breast: Secondary | ICD-10-CM

## 2017-02-19 HISTORY — DX: Malignant neoplasm of unspecified site of unspecified female breast: C50.919

## 2017-02-26 ENCOUNTER — Ambulatory Visit: Payer: Medicare HMO

## 2017-02-27 DIAGNOSIS — R441 Visual hallucinations: Secondary | ICD-10-CM | POA: Diagnosis not present

## 2017-02-27 DIAGNOSIS — E782 Mixed hyperlipidemia: Secondary | ICD-10-CM | POA: Diagnosis not present

## 2017-02-27 DIAGNOSIS — Z0000 Encounter for general adult medical examination without abnormal findings: Secondary | ICD-10-CM | POA: Diagnosis not present

## 2017-02-27 DIAGNOSIS — I10 Essential (primary) hypertension: Secondary | ICD-10-CM | POA: Diagnosis not present

## 2017-02-28 ENCOUNTER — Other Ambulatory Visit: Payer: Self-pay | Admitting: Family Medicine

## 2017-02-28 DIAGNOSIS — R441 Visual hallucinations: Secondary | ICD-10-CM

## 2017-03-05 ENCOUNTER — Ambulatory Visit
Admission: RE | Admit: 2017-03-05 | Discharge: 2017-03-05 | Disposition: A | Discharge status: Home / Self Care | Payer: Medicare HMO | Source: Ambulatory Visit | Attending: Obstetrics and Gynecology | Admitting: Obstetrics and Gynecology

## 2017-03-05 DIAGNOSIS — R928 Other abnormal and inconclusive findings on diagnostic imaging of breast: Secondary | ICD-10-CM

## 2017-03-05 DIAGNOSIS — C50411 Malignant neoplasm of upper-outer quadrant of right female breast: Secondary | ICD-10-CM | POA: Insufficient documentation

## 2017-03-05 DIAGNOSIS — I10 Essential (primary) hypertension: Secondary | ICD-10-CM | POA: Diagnosis not present

## 2017-03-05 DIAGNOSIS — N6310 Unspecified lump in the right breast, unspecified quadrant: Secondary | ICD-10-CM | POA: Diagnosis present

## 2017-03-05 DIAGNOSIS — E782 Mixed hyperlipidemia: Secondary | ICD-10-CM | POA: Diagnosis not present

## 2017-03-05 DIAGNOSIS — Z170 Estrogen receptor positive status [ER+]: Secondary | ICD-10-CM | POA: Insufficient documentation

## 2017-03-05 DIAGNOSIS — N6311 Unspecified lump in the right breast, upper outer quadrant: Secondary | ICD-10-CM | POA: Diagnosis not present

## 2017-03-05 HISTORY — PX: BREAST BIOPSY: SHX20

## 2017-03-08 ENCOUNTER — Ambulatory Visit
Admission: RE | Admit: 2017-03-08 | Discharge: 2017-03-08 | Disposition: A | Discharge status: Home / Self Care | Payer: Medicare HMO | Source: Ambulatory Visit | Attending: Family Medicine | Admitting: Family Medicine

## 2017-03-08 DIAGNOSIS — R441 Visual hallucinations: Secondary | ICD-10-CM

## 2017-03-08 DIAGNOSIS — R443 Hallucinations, unspecified: Secondary | ICD-10-CM | POA: Diagnosis not present

## 2017-03-12 NOTE — Progress Notes (Signed)
  Oncology Nurse Navigator Documentation  Navigator Location: [facility]Med Onc (03/12/17 1500)   )Navigator Encounter Type: Introductory phone call (03/12/17 1500)   Abnormal Finding Date: 01/22/17 (03/12/17 1500) Confirmed Diagnosis Date: 03/05/17(Patient had cataract surgery 12/15) (03/12/17 1500)               Patient Visit Type: Initial (03/12/17 1500)   Barriers/Navigation Needs: Education;Coordination of Care (03/12/17 1500)   Interventions: Coordination of Care;Education (03/12/17 1500)   Coordination of Care: Appts (03/12/17 1500) Education Method: Teach-back;Written (03/12/17 1500)                Time Spent with Patient: 60 (03/12/17 1500)   Phoned patient to introduce Navigation service.  Scheduled Surgical consult with Dr. Windell Moment, and Med-Onc consult with Dr. Grayland Ormond for Thursday 03/14/17. Patient has had multiple health issues in 2018. She has had a  Patient had cardiac cath in May, and  is followed by Dr. Clayborn Bigness.  She is also having cataract surgeries. Will meet patient at initial Med/Onc visit on 03/14/17.

## 2017-03-13 LAB — SURGICAL PATHOLOGY

## 2017-03-14 ENCOUNTER — Inpatient Hospital Stay: Payer: Medicare HMO | Attending: Oncology | Admitting: Oncology

## 2017-03-14 ENCOUNTER — Other Ambulatory Visit: Payer: Self-pay

## 2017-03-14 ENCOUNTER — Other Ambulatory Visit: Payer: Self-pay | Admitting: General Surgery

## 2017-03-14 ENCOUNTER — Encounter: Payer: Self-pay | Admitting: Oncology

## 2017-03-14 DIAGNOSIS — R0989 Other specified symptoms and signs involving the circulatory and respiratory systems: Secondary | ICD-10-CM | POA: Diagnosis not present

## 2017-03-14 DIAGNOSIS — Z170 Estrogen receptor positive status [ER+]: Secondary | ICD-10-CM | POA: Diagnosis not present

## 2017-03-14 DIAGNOSIS — C50411 Malignant neoplasm of upper-outer quadrant of right female breast: Secondary | ICD-10-CM

## 2017-03-14 DIAGNOSIS — I25119 Atherosclerotic heart disease of native coronary artery with unspecified angina pectoris: Secondary | ICD-10-CM | POA: Diagnosis not present

## 2017-03-14 DIAGNOSIS — Z853 Personal history of malignant neoplasm of breast: Secondary | ICD-10-CM | POA: Insufficient documentation

## 2017-03-14 DIAGNOSIS — E669 Obesity, unspecified: Secondary | ICD-10-CM | POA: Diagnosis not present

## 2017-03-14 DIAGNOSIS — I10 Essential (primary) hypertension: Secondary | ICD-10-CM | POA: Diagnosis not present

## 2017-03-14 DIAGNOSIS — R928 Other abnormal and inconclusive findings on diagnostic imaging of breast: Secondary | ICD-10-CM | POA: Diagnosis not present

## 2017-03-14 NOTE — Progress Notes (Signed)
  Oncology Nurse Navigator Documentation  Navigator Location: [facility] Onc (03/14/17 1600) Referral date to RadOnc/MedOnc: 03/14/17 (03/14/17 1600) )Navigator Encounter Type: Initial MedOnc (03/14/17 1600)       Surgery Date: 03/25/17 (03/14/17 1600)             Patient Visit Type: Initial;MedOnc (03/14/17 1600) Treatment Phase: Pre-Tx/Tx Discussion (03/14/17 1600) Barriers/Navigation Needs: Education;Coordination of Care (03/14/17 1600) Education: Preparing for Upcoming Surgery/ Treatment;Newly Diagnosed Cancer Education;Coping with Diagnosis/ Prognosis;Understanding Cancer/ Treatment Options (03/14/17 1600)                        Time Spent with Patient: 60 (03/14/17 1600)   Met patient , family at initial Med/Onc consult.  They met earlier today with Dr. Windell Moment. Plans for lumpectomy on 03/25/17.  Dr Grayland Ormond thoroughly explained treatment plan which will be finalized at two week post-op visit. Given Breast Cancer Treatment handbook/folder with hospital services.

## 2017-03-14 NOTE — Progress Notes (Signed)
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  INTERVAL HISTORY: Patient is a 77 year old female who was noted to have an abnormality on routine screening mammogram.  Subsequent ultrasound and biopsy revealed the above-stated malignancy.  Currently, she is anxious but otherwise feels well.  She has no neurologic complaints.  She denies any recent fevers or illnesses.  She has a good appetite and denies weight loss.  She has no chest pain or shortness of breath.  She denies any nausea, vomiting, constipation, or diarrhea.  She has no urinary complaints.  Patient feels at her baseline offers no specific complaints today.  REVIEW OF SYSTEMS:   Review of Systems  Constitutional: Negative.  Negative for fever, malaise/fatigue and weight loss.  Respiratory: Negative.  Negative for cough and shortness of breath.   Cardiovascular: Negative.  Negative for chest pain and leg swelling.  Gastrointestinal: Negative.  Negative for abdominal pain.  Genitourinary: Negative.   Musculoskeletal: Negative.  Negative for back pain.  Skin: Negative.  Negative for rash.  Neurological: Negative.  Negative for sensory change and weakness.  Psychiatric/Behavioral: The patient is nervous/anxious.     As per HPI. Otherwise, a complete review of systems is negative.  PAST MEDICAL HISTORY: Past Medical History:  Diagnosis Date  . Anginal pain ([place])    occas. took ntg last week  . Anxiety   . Complication of anesthesia   . Coronary artery disease   . Dyspnea    doe  . High cholesterol   . Myocardial infarction ([place])    5/18  . PONV (postoperative nausea and vomiting)     PAST SURGICAL HISTORY: Past  Surgical History:  Procedure Laterality Date  . ABDOMINAL HYSTERECTOMY    . ACHILLES TENDON SURGERY    . BREAST BIOPSY Right 03/05/2017   Affirm Bx- path pending  . CATARACT EXTRACTION W/PHACO Left 01/29/2017   Procedure: CATARACT EXTRACTION PHACO AND INTRAOCULAR LENS PLACEMENT (IOC);  Surgeon: Birder Robson, MD;  Location: [facility] ORS;  Service: Ophthalmology;  Laterality: Left;  [place] 00:49.9 AP% 20.3 CDE 10.11 Fluid Pack lot # [id number]  . CHOLECYSTECTOMY    . CORONARY ANGIOPLASTY     STENT 5/18  . CORONARY STENT INTERVENTION N/A 07/18/2016   Procedure: Coronary Stent Intervention;  Surgeon: Yolonda Kida, MD;  Location: [facility];  Service: Cardiovascular;  Laterality: N/A;  . CTR    . INTRAVASCULAR PRESSURE WIRE/FFR STUDY N/A 07/18/2016   Procedure: Intravascular Pressure Wire/FFR Study;  Surgeon: Yolonda Kida, MD;  Location: [facility];  Service: Cardiovascular;  Laterality: N/A;  . LEFT HEART CATH AND CORONARY ANGIOGRAPHY N/A 07/18/2016   Procedure: Left Heart Cath and Coronary Angiography;  Surgeon: Yolonda Kida, MD;  Location: [facility];  Service: Cardiovascular;  Laterality: N/A;    FAMILY HISTORY: Family History  Problem Relation Age of Onset  . Breast cancer Paternal Aunt        post men.     ADVANCED DIRECTIVES (Y/N):  N  HEALTH MAINTENANCE: Social History   Tobacco Use  . Smoking status: Never Smoker  . Smokeless tobacco: Never Used  Substance Use Topics  . Alcohol use: No  . Drug use: No  Colonoscopy:  PAP:  Bone density:  Lipid panel:  Allergies  Allergen Reactions  . Metoprolol Other (See Comments)    Hypotension   . Other Diarrhea and Other (See Comments)    All mycin antibiotics   . Red Yeast Rice [Cholestin] Other (See Comments)    GI discomfort   . Statins Other (See Comments)    Myalgia, cramps     Current Outpatient Medications  Medication Sig Dispense Refill  . aspirin EC 81 MG tablet  Take 81 mg by mouth at bedtime.    Marland Kitchen atorvastatin (LIPITOR) 40 MG tablet Take 40 mg by mouth every evening.     . clopidogrel (PLAVIX) 75 MG tablet Take 75 mg by mouth daily.    . isosorbide mononitrate (IMDUR) 30 MG 24 hr tablet Take 30 mg by mouth daily.    . metoprolol succinate (TOPROL-XL) 25 MG 24 hr tablet Take 12.5-25 mg by mouth 2 (two) times daily. TAKE 0.5 TABLET (12.5 MG) BY MOUTH EVERY MORNING & TAKE 1 TABLET (25 MG) BY MOUTH IN THE EVENING    . nitroGLYCERIN (NITROSTAT) 0.4 MG SL tablet Place 0.4 mg under the tongue every 5 (five) minutes as needed for chest pain.     Marland Kitchen PARoxetine (PAXIL) 20 MG tablet Take 20 mg by mouth at bedtime.    . Calcium Carbonate-Simethicone (ALKA-SELTZER HEARTBURN + GAS) 750-80 MG CHEW Chew 1 tablet by mouth daily as needed (heartburn).    . Camphor-Menthol-Capsicum (TIGER BALM PAIN RELIEVING) 80-24-16 MG PTCH Place 1-3 patches onto the skin daily as needed (pain).     . cholecalciferol (VITAMIN D) 400 units TABS tablet Take 400 Units by mouth at bedtime.    . docusate sodium (COLACE) 100 MG capsule Take 100 mg by mouth 2 (two) times daily as needed (for constipation).    . ferrous sulfate 325 (65 FE) MG tablet Take 325 mg by mouth at bedtime.    . fexofenadine (ALLEGRA) 180 MG tablet Take 180 mg by mouth at bedtime as needed for allergies or rhinitis.    Marland Kitchen HYDROcodone-acetaminophen (NORCO/VICODIN) 5-325 MG tablet Take 1 tablet by mouth daily as needed for moderate pain.    . mirabegron ER (MYRBETRIQ) 50 MG TB24 tablet Take 50 mg by mouth daily as needed (for overactive bladder (typically once a week)).     . Polyvinyl Alcohol-Povidone PF (REFRESH) 1.4-0.6 % SOLN Place 1-2 drops into both eyes 3 (three) times daily as needed (for dry eyes.).    Marland Kitchen ticagrelor (BRILINTA) 90 MG TABS tablet Take 1 tablet (90 mg total) by mouth 2 (two) times daily. (Patient not taking: Reported on 03/15/2017) 60 tablet 12  . trolamine salicylate (ASPERCREME) 10 % cream Apply 1  application topically 4 (four) times daily as needed for muscle pain.     . vitamin B-12 (CYANOCOBALAMIN) 1000 MCG tablet Take 1,000 mcg by mouth at bedtime.     No current facility-administered medications for this visit.     OBJECTIVE: Vitals:   03/14/17 1504  BP: 133/81  Pulse: 75  Resp: 18  Temp: (!) 97.5 F (36.4 C)     Body mass index is 38.72 kg/m.    ECOG FS:0 - Asymptomatic  General: Well-developed, well-nourished, no acute distress. Eyes: Pink conjunctiva, anicteric sclera. HEENT: Normocephalic, moist mucous membranes, clear oropharnyx. Breasts: Patient requested exam deferred today. Lungs: Clear to auscultation bilaterally. Heart: Regular rate and rhythm. No rubs, murmurs, or gallops. Abdomen: Soft, nontender, nondistended. No organomegaly noted, normoactive bowel sounds.  Musculoskeletal: No edema, cyanosis, or clubbing. Neuro: Alert, answering all questions appropriately. Cranial nerves grossly intact. Skin: No rashes or petechiae noted. Psych: Normal affect. Lymphatics: No cervical, calvicular, axillary or inguinal LAD.   LAB RESULTS:  Lab Results  Component Value Date   NA 139 01/20/2017   K 4.5 01/20/2017   CL 104 01/20/2017   CO2 26 01/20/2017   GLUCOSE 108 (H) 01/20/2017   BUN 10 01/20/2017   CREATININE 0.63 01/20/2017   CALCIUM 9.0 01/20/2017   PROT 8.1 08/11/2011   ALBUMIN 3.8 08/11/2011   AST 24 08/11/2011   ALT 32 08/11/2011   ALKPHOS 72 08/11/2011   BILITOT 0.6 08/11/2011   GFRNONAA >60 01/20/2017   GFRAA >60 01/20/2017    Lab Results  Component Value Date   WBC 9.6 01/20/2017   NEUTROABS 6.1 01/20/2017   HGB 14.8 01/20/2017   HCT 44.3 01/20/2017   MCV 94.9 01/20/2017   PLT 291 01/20/2017     STUDIES: Ct Head Wo Contrast  Result Date: 03/08/2017 CLINICAL DATA:  77 year old female with visual hallucinations intermittently for 1 year. EXAM: CT HEAD WITHOUT CONTRAST TECHNIQUE: Contiguous axial images were obtained from the base of  the skull through the vertex without intravenous contrast. COMPARISON:  Brain MRI 07/16/2009. FINDINGS: Brain: Cerebral volume is within normal limits for age. No midline shift, ventriculomegaly, mass effect, evidence of mass lesion, intracranial hemorrhage or evidence of cortically based acute infarction. Gray-white matter differentiation is within normal limits throughout the brain. No cortical encephalomalacia identified. Bilateral occipital lobes appear normal. No suprasellar mass or mass effect. Vascular: Calcified atherosclerosis at the skull base. No suspicious intracranial vascular hyperdensity. Skull: Negative.  No acute osseous abnormality identified. Sinuses/Orbits: Visualized paranasal sinuses and mastoids are stable and well pneumatized. Other: Visible orbits soft tissues appear normal. Visualized scalp soft tissues are within normal limits. IMPRESSION: Normal for age non contrast CT appearance of the brain. No explanation for abnormal vision. Electronically Signed   By: Genevie Ann M.D.   On: 03/08/2017 17:23   Mm Clip Placement Right  Result Date: 03/05/2017 CLINICAL DATA:  Evaluate clip placement following 3D/stereotactic guided right breast biopsy. EXAM: DIAGNOSTIC RIGHT MAMMOGRAM POST STEREOTACTIC BIOPSY COMPARISON:  Previous exam(s). FINDINGS: Mammographic images were obtained following 3D/stereotactic guided biopsy of 7 mm upper-outer right breast mass. The X shaped clip is in satisfactory position. IMPRESSION: Satisfactory clip position following 3D/stereotactic guided right breast biopsy. Final Assessment: Post Procedure Mammograms for Marker Placement Electronically Signed   By: Margarette Canada M.D.   On: 03/05/2017 13:30   Mm Rt Breast Bx W Loc Dev 1st Lesion Image Bx Spec Stereo Guide  Result Date: 03/05/2017 CLINICAL DATA:  77 year old female for tissue sampling of 7 mm upper-outer right breast mass. EXAM: RIGHT BREAST STEREOTACTIC CORE NEEDLE BIOPSY COMPARISON:  Previous exams. FINDINGS:  The patient and I discussed the procedure of stereotactic-guided biopsy including benefits and alternatives. We discussed the high likelihood of a successful procedure. We discussed the risks of the procedure including infection, bleeding, tissue injury, clip migration, and inadequate sampling. Informed written consent was given. The usual time out protocol was performed immediately prior to the procedure. Using sterile technique and 1% Lidocaine as local anesthetic, under stereotactic guidance, a 9 gauge vacuum assisted device was used to perform core needle biopsy of 7 mm mass within the upper-outer right breast using a superior approach. Lesion quadrant: Upper-outer right breast At the conclusion of the procedure, a X shaped tissue marker clip was deployed into  the biopsy cavity. Follow-up 2-view mammogram was performed and dictated separately. IMPRESSION: Stereotactic-guided biopsy of 7 mm upper-outer right breast mass. No apparent complications. Electronically Signed   By: Margarette Canada M.D.   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  PLAN:    1. 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  Patient was evaluated by surgery earlier today and has a lumpectomy scheduled for March 25, 2017.  Given the stage and size of patient's tumor, it is unlikely she will require chemotherapy but will send pathology for Oncotype DX to determine whether this is a high or low risk malignancy.  Given the fact the patient is scheduled for lumpectomy, she will benefit from adjuvant XRT and a referral has been made to radiation oncology.  Finally, because of her ER/PR status, she will benefit from an aromatase inhibitor for 5 years at the completion of all her treatments.  Patient will return  to clinic in mid February for further evaluation, discussion of her final pathology results, and additional treatment planning.  Approximately 60 minutes was spent in discussion of which greater than 50% was consultation. Patient expressed understanding and was in agreement with this plan. She also understands that She can call clinic at any time with any questions, concerns, or complaints.   Cancer Staging Primary cancer of upper outer quadrant of right female breast [facility]) Staging form: Breast, AJCC 8th Edition - Clinical stage from 03/14/2017: Stage IA (cT1b, cN0, cM0, G2, ER: Positive, PR: Positive, HER2: Negative) - Signed by Lloyd Huger, MD on 03/15/2017   Lloyd Huger, MD   03/15/2017 9:27 AM

## 2017-03-14 NOTE — Progress Notes (Signed)
Patient here today for initial evaluation regarding breast cancer.  

## 2017-03-15 ENCOUNTER — Ambulatory Visit: Payer: Self-pay | Admitting: General Surgery

## 2017-03-15 ENCOUNTER — Other Ambulatory Visit: Payer: Self-pay | Admitting: General Surgery

## 2017-03-15 DIAGNOSIS — C50411 Malignant neoplasm of upper-outer quadrant of right female breast: Secondary | ICD-10-CM | POA: Insufficient documentation

## 2017-03-15 DIAGNOSIS — Z170 Estrogen receptor positive status [ER+]: Principal | ICD-10-CM

## 2017-03-15 NOTE — H&P (View-Only) (Signed)
PATIENT PROFILE: Linda Yang is a 77 y.o. female who presents to the Clinic for consultation at the request of Dr. Leafy Ro for evaluation of breast cancer.  PCP: Dion Body, MD  HISTORY OF PRESENT ILLNESS: Ms. Reach reports being on her usual state of health and had recent screening mammography showing a new suspicious mass. That led to biopsy that confirmed malignancy of the right breast. Patient denies any symptoms of breast mass, pain or nipple secretions. Patient refers first menstrual cycle was when she was 6-60 years old, last menstrual cycle ~46 with hysterectomy due to fibroids, family history of breast cancer = aunt around 59 years old, no history of radiation to breast, no hormonal therapy, no ovarian cancer (personal or family history) and no previous breast biopsy.   No pain, no pain radiation. Nothing improves or aggravates pain.   PROBLEM LIST: Problem List Date Reviewed: 02/27/2017  Noted  Malignant neoplasm of upper-outer quadrant of right breast in female, estrogen receptor positive (CMS-HCC) 03/14/2017  Medicare annual wellness visit, initial 02/27/17 02/27/2017  Coronary artery disease involving native coronary artery of native heart s/p stent placement (07/18/16) - followed by Dr. Clayborn Bigness 07/26/2016  Vitamin D deficiency, unspecified (26 - 10/25/16) 03/22/2016  Osteopenia of multiple sites (Dexa 02/16/16 - repeat 2 yrs) 02/17/2016  Vaccine counseling: TDAP vaccine- 07/03/13; Zostavax vaccine- 11/06/11; PCV-13 vaccine-02/18/16; PNA-23 vaccine- Pt will check with her ins. co. egarding vaccine coverage; Shringix #1- 03/01/17 (03/01/17) 02/15/2016  Essential hypertension (dx 02/2015) - controlled off of medicine 03/09/2015  Anxiety state 01/19/2015  Mixed hyperlipidemia (Trig 156, LDL 54 - 03/05/17) Unknown  Non morbid obesity due to excess calories Unknown  B12 deficiency (506 - 03/21/16) - no supplementation Unknown  DDD (degenerative disc disease), lumbar 08/10/2013  Lumbar  radiculitis 08/10/2013  De Quervain's tenosynovitis, left 08/21/2012  Low back pain radiating to right leg 07/24/2012    GENERAL REVIEW OF SYSTEMS:   General ROS: negative for - chills, fatigue, fever, weight gain or weight loss Allergy and Immunology ROS: negative for - hives  Hematological and Lymphatic ROS: Positive for - bleeding problems or bruising, negative for palpable nodes Endocrine ROS: Positive for - heat or cold intolerance Respiratory ROS: negative for - cough, shortness of breath or wheezing Cardiovascular ROS: no chest pain. Positive for palpitations GI ROS: negative for nausea, vomiting, abdominal pain, diarrhea, constipation Musculoskeletal ROS: negative for - joint swelling or muscle pain Neurological ROS: negative for - confusion, syncope Dermatological ROS: negative for pruritus and rash Psychiatric: negative for anxiety, depression, difficulty sleeping and memory loss  MEDICATIONS: Current Outpatient Medications  Medication Sig Dispense Refill  . aspirin 81 mg tablet 1 tab by mouth daily  . atorvastatin (LIPITOR) 40 MG tablet Take 1 tablet (40 mg total) by mouth nightly 90 tablet 1  . calcium carbonate (TUMS E-X) 300 mg (750 mg) chewable tablet Take 300 mg of elemental by mouth every 2 (two) hours as needed for Heartburn. Reported on 03/25/2015  . calcium carbonate-vitamin D3 (CALTRATE 600+D) 600 mg(1,549m) -400 unit tablet Take 2 tablets by mouth once daily.  . clopidogrel (PLAVIX) 75 mg tablet Take 1 tablet (75 mg total) by mouth once daily. Take 4 tablets (309m by mouth on day 1. Then 75 mg daily thereafter. (Patient taking differently: Take 75 mg by mouth once daily.  ) 30 tablet 5  . estradiol (ESTRACE) 0.01 % (0.1 mg/gram) vaginal cream Place 0.5 g vaginally once daily 30 g 3  . fexofenadine (ALLEGRA) 180 MG tablet  Take 180 mg by mouth once daily as needed.   Marland Kitchen HYDROcodone-acetaminophen (NORCO) 5-325 mg tablet Take 1 tablet by mouth 2 (two) times daily as  needed. 50 tablet 0  . isosorbide mononitrate (IMDUR) 30 MG ER tablet Take 1 tablet (30 mg total) by mouth once daily. 30 tablet 11  . LORazepam (ATIVAN) 0.5 MG tablet 1 tab half hour prior to CT scan 1 tablet 0  . metoprolol succinate (TOPROL-XL) 25 MG XL tablet Take 1.5 tablets (37.5 mg total) by mouth once daily Take one half tablet (12.63m) in the morning. Take one whole tablet (262m at night. 45 tablet 3  . mirabegron (MYRBETRIQ) 50 mg ER tablet Take 1 tablet (50 mg total) by mouth once daily 30 tablet 3  . nitroGLYcerin (NITROSTAT) 0.4 MG SL tablet Place 1 tablet (0.4 mg total) under the tongue every 5 (five) minutes as needed for Chest pain May take up to 3 doses. 25 tablet 2  . PARoxetine (PAXIL) 20 MG tablet Take 1 tablet (20 mg total) by mouth nightly. 90 tablet 0  . prenatal vit-iron fum-folic ac (PRENAVITE) tablet Take 2 tablets by mouth once daily Prenatal chewable vitamins- 2 tablets by mouth once daily   No current facility-administered medications for this visit.   ALLERGIES: Brilinta [ticagrelor]; Erythromycin; Other; and Multi-antibiotic [neomycin-polymyxin]  PAST MEDICAL HISTORY: . B12 deficiency  . Fatty liver  . Glaucoma  . High cholesterol  . Obesity, unspecified   PAST SURGICAL HISTORY: . Achiles tendon surgery Left  . cardiac stent 06/2016  Dr CaClayborn Bigness. CATARACT EXTRACTION Left 01/29/2017  Done by Dr. PoGeorge Ina. COLONOSCOPY 03/31/2012, 11/02/2005  Adenomatous Polyp: CBF 03/2017; Recall Ltr mailed 03/04/2017 (dh)  . DES placement 07/18/2016  . ENDOSCOPIC CARPAL TUNNEL RELEASE Bilateral  . foot surgery  achilles tendon repair  . HYSTERECTOMY 1987  partial  . trigger finger release Left    FAMILY HISTORY: Problem Relation Age of Onset  . Stroke Mother 702. Dementia Mother  . Lymphoma Father 6587. Breast cancer Paternal Aunt [id number]  SOCIAL HISTORY: Socioeconomic History  . Marital status: Married  Spouse name: CuElisabeth CaraIsley, Sr.  . Number of  children: 3  . Years of education: 1458. Highest education level: Not on file  Social Needs  . Financial resource strain: Not on file  . Food insecurity - worry: Not on file  . Food insecurity - inability: Not on file  . Transportation needs - medical: Not on file  . Transportation needs - non-medical: Not on file  Occupational History  . Not on file  Tobacco Use  . Smoking status: Never Smoker  . Smokeless tobacco: Never Used  Substance and Sexual Activity  . Alcohol use: No  . Drug use: No  . Sexual activity: Defer  Birth control/protection: Surgical  Comment: hysterectomy  Other Topics Concern  . Not on file  Social History Narrative  Marital status- Married  Lives with husband  Employment- Retired  Supplements- Vitamin B12 injections  Exercise hx- Goes to gym 2-4x/weekly  Religious affliation- [employer]   PHYSICAL EXAM: Vitals:  03/14/17 1015  Temp: 36.9 C (98.5 F)   Body mass index is 38.01 kg/m. Weight: 94.3 kg (207 lb 12.8 oz)   GENERAL: Alert, active, oriented x3  HEENT: Pupils equal reactive to light. Extraocular movements are intact. Sclera clear. Palpebral conjunctiva normal red color.Pharynx clear.  NECK: Supple with no palpable mass and no adenopathy.  BREAST:  both breast were evaluated. There is no palpable masses, no lymph node palpated, no skin changes, no nipple secretions or retraction. No deformity.   LUNGS: Sound clear with no rales rhonchi or wheezes.  HEART: Regular rhythm S1 and S2 without murmur.  ABDOMEN: Soft and depressible, nontender with no palpable mass, no hepatomegaly.  EXTREMITIES: Well-developed well-nourished symmetrical with no dependent edema.  NEUROLOGICAL: Awake alert oriented, facial expression symmetrical, moving all extremities.  REVIEW OF DATA: I have reviewed the following data today: Appointment on 03/05/2017  Component Date Value  . Glucose 03/05/2017 106  . Sodium 03/05/2017 142  . Potassium 03/05/2017 4.3   . Chloride 03/05/2017 104  . Carbon Dioxide (CO2) 03/05/2017 29.3  . Urea Nitrogen (BUN) 03/05/2017 16  . Creatinine 03/05/2017 0.6  . Glomerular Filtration Ra* 03/05/2017 97  . Calcium 03/05/2017 9.0  . AST 03/05/2017 14  . ALT 03/05/2017 20  . Alk Phos (alkaline Phosp* 03/05/2017 54  . Albumin 03/05/2017 4.0  . Bilirubin, Total 03/05/2017 0.7  . Protein, Total 03/05/2017 6.6  . A/G Ratio 03/05/2017 1.5  . Cholesterol, Total 03/05/2017 128  . Triglyceride 03/05/2017 156  . HDL (High Density Lipopr* 03/05/2017 43.3  . LDL (Low Density Lipopro* 03/05/2017 54  . VLDL Cholesterol 03/05/2017 31  . Cholesterol/HDL Ratio 03/05/2017 3.0   Pathology report:  A. BREAST, RIGHT, UPPER OUTER QUADRANT; STEREOTACTIC-GUIDED CORE BIOPSY: - INVASIVE MAMMARY CARCINOMA, NO SPECIAL TYPE.  Size of invasive carcinoma: 5.5 mm in this sample Histologic grade of invasive carcinoma: Grade 1  Glandular/tubular differentiation score: 3  Nuclear pleomorphism score: 1  Mitotic rate score: 1  Total score: 5  Ductal carcinoma in situ: Not identified Lymphovascular invasion: Not identified  Comment: The definitive grade will be assigned on the excisional specimen. The architecture is one of rounded solid nests of tumor cells without significant stromal reaction. Immunohistochemistry was performed for further characterization. No p63-positive myoepithelial cells are identified within the lesion. The neoplastic cells are positive for E-cadherin with diffuse strong staining. These results indicate an invasive carcinoma without lobular characteristics, supporting the above diagnosis.  These findings were communicated to [facility] in the [facility] on 03/08/2017 at 2:35 PM. Read-back procedure was performed.  BREAST BIOMARKER TEST RESULTS: Estrogen Receptor (ER) Status: POSITIVE, >90% of cells with nuclear positivity  Average intensity of staining:  Strong Progesterone Receptor (PR) Status: POSITIVE, >90% of cells with nuclear positivity  Average intensity of staining: Strong HER2 (by immunohistochemistry): NEGATIVE (score 0)  ASSESSMENT: Ms. Leising is a 77 y.o. female presenting for consultation for breast cancer. Patient was found with a invasive mammary carcinoma, grade 1, ER/PR positive that on mammographic images measures 63m. Images were reviewed and mass identified with clip seen in place. The patient was oriented about the diagnosis of breast cancer and the surgical alternatives. Partial mastectomy and total mastectomy, both with sentinel lymph node biopsy were discussed with patient including the differences, benefits and risks. Patient with a cardiac drug eluting stent on May 2018 currently on Plavix and aspirin. Due to the her medical condition, a smaller surgery may be of benefit. Patient agreed but want to discuss with oncologist. Patient will be seen by Dr. FGrayland Ormondtoday. Will coordinate surgery for March 25, 2017 and will ask for Internal Medicine and Cardiac clearance.   PLAN: 1. Partial mastectomy needle guided [id number] 2. Sentinel Lymph node biopsy [id number] 3. CBC, Chest xray 4. Internal Medicine clearance 5. Cardiology clearance and recommendations regarding Plavix  Patient verbalized  understanding, all questions were answered, and were agreeable with the plan outlined above.   Herbert Pun, MD  Electronically signed by Herbert Pun, MD

## 2017-03-15 NOTE — H&P (Signed)
PATIENT PROFILE: Linda Yang is a 77 y.o. female who presents to the Clinic for consultation at the request of Dr. Leafy Ro for evaluation of breast cancer.  PCP: Dion Body, MD  HISTORY OF PRESENT ILLNESS: Ms. Reach reports being on her usual state of health and had recent screening mammography showing a new suspicious mass. That led to biopsy that confirmed malignancy of the right breast. Patient denies any symptoms of breast mass, pain or nipple secretions. Patient refers first menstrual cycle was when she was 6-60 years old, last menstrual cycle ~46 with hysterectomy due to fibroids, family history of breast cancer = aunt around 59 years old, no history of radiation to breast, no hormonal therapy, no ovarian cancer (personal or family history) and no previous breast biopsy.   No pain, no pain radiation. Nothing improves or aggravates pain.   PROBLEM LIST: Problem List Date Reviewed: 02/27/2017  Noted  Malignant neoplasm of upper-outer quadrant of right breast in female, estrogen receptor positive (CMS-HCC) 03/14/2017  Medicare annual wellness visit, initial 02/27/17 02/27/2017  Coronary artery disease involving native coronary artery of native heart s/p stent placement (07/18/16) - followed by Dr. Clayborn Bigness 07/26/2016  Vitamin D deficiency, unspecified (26 - 10/25/16) 03/22/2016  Osteopenia of multiple sites (Dexa 02/16/16 - repeat 2 yrs) 02/17/2016  Vaccine counseling: TDAP vaccine- 07/03/13; Zostavax vaccine- 11/06/11; PCV-13 vaccine-02/18/16; PNA-23 vaccine- Pt will check with her ins. co. egarding vaccine coverage; Shringix #1- 03/01/17 (03/01/17) 02/15/2016  Essential hypertension (dx 02/2015) - controlled off of medicine 03/09/2015  Anxiety state 01/19/2015  Mixed hyperlipidemia (Trig 156, LDL 54 - 03/05/17) Unknown  Non morbid obesity due to excess calories Unknown  B12 deficiency (506 - 03/21/16) - no supplementation Unknown  DDD (degenerative disc disease), lumbar 08/10/2013  Lumbar  radiculitis 08/10/2013  De Quervain's tenosynovitis, left 08/21/2012  Low back pain radiating to right leg 07/24/2012    GENERAL REVIEW OF SYSTEMS:   General ROS: negative for - chills, fatigue, fever, weight gain or weight loss Allergy and Immunology ROS: negative for - hives  Hematological and Lymphatic ROS: Positive for - bleeding problems or bruising, negative for palpable nodes Endocrine ROS: Positive for - heat or cold intolerance Respiratory ROS: negative for - cough, shortness of breath or wheezing Cardiovascular ROS: no chest pain. Positive for palpitations GI ROS: negative for nausea, vomiting, abdominal pain, diarrhea, constipation Musculoskeletal ROS: negative for - joint swelling or muscle pain Neurological ROS: negative for - confusion, syncope Dermatological ROS: negative for pruritus and rash Psychiatric: negative for anxiety, depression, difficulty sleeping and memory loss  MEDICATIONS: Current Outpatient Medications  Medication Sig Dispense Refill  . aspirin 81 mg tablet 1 tab by mouth daily  . atorvastatin (LIPITOR) 40 MG tablet Take 1 tablet (40 mg total) by mouth nightly 90 tablet 1  . calcium carbonate (TUMS E-X) 300 mg (750 mg) chewable tablet Take 300 mg of elemental by mouth every 2 (two) hours as needed for Heartburn. Reported on 03/25/2015  . calcium carbonate-vitamin D3 (CALTRATE 600+D) 600 mg(1,549m) -400 unit tablet Take 2 tablets by mouth once daily.  . clopidogrel (PLAVIX) 75 mg tablet Take 1 tablet (75 mg total) by mouth once daily. Take 4 tablets (309m by mouth on day 1. Then 75 mg daily thereafter. (Patient taking differently: Take 75 mg by mouth once daily.  ) 30 tablet 5  . estradiol (ESTRACE) 0.01 % (0.1 mg/gram) vaginal cream Place 0.5 g vaginally once daily 30 g 3  . fexofenadine (ALLEGRA) 180 MG tablet  Take 180 mg by mouth once daily as needed.   Marland Kitchen HYDROcodone-acetaminophen (NORCO) 5-325 mg tablet Take 1 tablet by mouth 2 (two) times daily as  needed. 50 tablet 0  . isosorbide mononitrate (IMDUR) 30 MG ER tablet Take 1 tablet (30 mg total) by mouth once daily. 30 tablet 11  . LORazepam (ATIVAN) 0.5 MG tablet 1 tab half hour prior to CT scan 1 tablet 0  . metoprolol succinate (TOPROL-XL) 25 MG XL tablet Take 1.5 tablets (37.5 mg total) by mouth once daily Take one half tablet (12.63m) in the morning. Take one whole tablet (262m at night. 45 tablet 3  . mirabegron (MYRBETRIQ) 50 mg ER tablet Take 1 tablet (50 mg total) by mouth once daily 30 tablet 3  . nitroGLYcerin (NITROSTAT) 0.4 MG SL tablet Place 1 tablet (0.4 mg total) under the tongue every 5 (five) minutes as needed for Chest pain May take up to 3 doses. 25 tablet 2  . PARoxetine (PAXIL) 20 MG tablet Take 1 tablet (20 mg total) by mouth nightly. 90 tablet 0  . prenatal vit-iron fum-folic ac (PRENAVITE) tablet Take 2 tablets by mouth once daily Prenatal chewable vitamins- 2 tablets by mouth once daily   No current facility-administered medications for this visit.   ALLERGIES: Brilinta [ticagrelor]; Erythromycin; Other; and Multi-antibiotic [neomycin-polymyxin]  PAST MEDICAL HISTORY: . B12 deficiency  . Fatty liver  . Glaucoma  . High cholesterol  . Obesity, unspecified   PAST SURGICAL HISTORY: . Achiles tendon surgery Left  . cardiac stent 06/2016  Dr CaClayborn Bigness. CATARACT EXTRACTION Left 01/29/2017  Done by Dr. PoGeorge Ina. COLONOSCOPY 03/31/2012, 11/02/2005  Adenomatous Polyp: CBF 03/2017; Recall Ltr mailed 03/04/2017 (dh)  . DES placement 07/18/2016  . ENDOSCOPIC CARPAL TUNNEL RELEASE Bilateral  . foot surgery  achilles tendon repair  . HYSTERECTOMY 1987  partial  . trigger finger release Left    FAMILY HISTORY: Problem Relation Age of Onset  . Stroke Mother 702. Dementia Mother  . Lymphoma Father 6587. Breast cancer Paternal Aunt [phone]  SOCIAL HISTORY: Socioeconomic History  . Marital status: Married  Spouse name: CuElisabeth CaraIsley, Sr.  . Number of  children: 3  . Years of education: 1458. Highest education level: Not on file  Social Needs  . Financial resource strain: Not on file  . Food insecurity - worry: Not on file  . Food insecurity - inability: Not on file  . Transportation needs - medical: Not on file  . Transportation needs - non-medical: Not on file  Occupational History  . Not on file  Tobacco Use  . Smoking status: Never Smoker  . Smokeless tobacco: Never Used  Substance and Sexual Activity  . Alcohol use: No  . Drug use: No  . Sexual activity: Defer  Birth control/protection: Surgical  Comment: hysterectomy  Other Topics Concern  . Not on file  Social History Narrative  Marital status- Married  Lives with husband  Employment- Retired  Supplements- Vitamin B12 injections  Exercise hx- Goes to gym 2-4x/weekly  Religious affliation- [employer]   PHYSICAL EXAM: Vitals:  03/14/17 1015  Temp: 36.9 C (98.5 F)   Body mass index is 38.01 kg/m. Weight: 94.3 kg (207 lb 12.8 oz)   GENERAL: Alert, active, oriented x3  HEENT: Pupils equal reactive to light. Extraocular movements are intact. Sclera clear. Palpebral conjunctiva normal red color.Pharynx clear.  NECK: Supple with no palpable mass and no adenopathy.  BREAST:  both breast were evaluated. There is no palpable masses, no lymph node palpated, no skin changes, no nipple secretions or retraction. No deformity.   LUNGS: Sound clear with no rales rhonchi or wheezes.  HEART: Regular rhythm S1 and S2 without murmur.  ABDOMEN: Soft and depressible, nontender with no palpable mass, no hepatomegaly.  EXTREMITIES: Well-developed well-nourished symmetrical with no dependent edema.  NEUROLOGICAL: Awake alert oriented, facial expression symmetrical, moving all extremities.  REVIEW OF DATA: I have reviewed the following data today: Appointment on 03/05/2017  Component Date Value  . Glucose 03/05/2017 106  . Sodium 03/05/2017 142  . Potassium 03/05/2017 4.3   . Chloride 03/05/2017 104  . Carbon Dioxide (CO2) 03/05/2017 29.3  . Urea Nitrogen (BUN) 03/05/2017 16  . Creatinine 03/05/2017 0.6  . Glomerular Filtration Ra* 03/05/2017 97  . Calcium 03/05/2017 9.0  . AST 03/05/2017 14  . ALT 03/05/2017 20  . Alk Phos (alkaline Phosp* 03/05/2017 54  . Albumin 03/05/2017 4.0  . Bilirubin, Total 03/05/2017 0.7  . Protein, Total 03/05/2017 6.6  . A/G Ratio 03/05/2017 1.5  . Cholesterol, Total 03/05/2017 128  . Triglyceride 03/05/2017 156  . HDL (High Density Lipopr* 03/05/2017 43.3  . LDL (Low Density Lipopro* 03/05/2017 54  . VLDL Cholesterol 03/05/2017 31  . Cholesterol/HDL Ratio 03/05/2017 3.0   Pathology report:  A. BREAST, RIGHT, UPPER OUTER QUADRANT; STEREOTACTIC-GUIDED CORE BIOPSY: - INVASIVE MAMMARY CARCINOMA, NO SPECIAL TYPE.  Size of invasive carcinoma: 5.5 mm in this sample Histologic grade of invasive carcinoma: Grade 1  Glandular/tubular differentiation score: 3  Nuclear pleomorphism score: 1  Mitotic rate score: 1  Total score: 5  Ductal carcinoma in situ: Not identified Lymphovascular invasion: Not identified  Comment: The definitive grade will be assigned on the excisional specimen. The architecture is one of rounded solid nests of tumor cells without significant stromal reaction. Immunohistochemistry was performed for further characterization. No p63-positive myoepithelial cells are identified within the lesion. The neoplastic cells are positive for E-cadherin with diffuse strong staining. These results indicate an invasive carcinoma without lobular characteristics, supporting the above diagnosis.  These findings were communicated to [facility] in the [facility] on 03/08/2017 at 2:35 PM. Read-back procedure was performed.  BREAST BIOMARKER TEST RESULTS: Estrogen Receptor (ER) Status: POSITIVE, >90% of cells with nuclear positivity  Average intensity of staining:  Strong Progesterone Receptor (PR) Status: POSITIVE, >90% of cells with nuclear positivity  Average intensity of staining: Strong HER2 (by immunohistochemistry): NEGATIVE (score 0)  ASSESSMENT: Ms. Leising is a 77 y.o. female presenting for consultation for breast cancer. Patient was found with a invasive mammary carcinoma, grade 1, ER/PR positive that on mammographic images measures 63m. Images were reviewed and mass identified with clip seen in place. The patient was oriented about the diagnosis of breast cancer and the surgical alternatives. Partial mastectomy and total mastectomy, both with sentinel lymph node biopsy were discussed with patient including the differences, benefits and risks. Patient with a cardiac drug eluting stent on May 2018 currently on Plavix and aspirin. Due to the her medical condition, a smaller surgery may be of benefit. Patient agreed but want to discuss with oncologist. Patient will be seen by Dr. FGrayland Ormondtoday. Will coordinate surgery for March 25, 2017 and will ask for Internal Medicine and Cardiac clearance.   PLAN: 1. Partial mastectomy needle guided 19301 2. Sentinel Lymph node biopsy 38525 3. CBC, Chest xray 4. Internal Medicine clearance 5. Cardiology clearance and recommendations regarding Plavix  Patient verbalized  understanding, all questions were answered, and were agreeable with the plan outlined above.   Herbert Pun, MD  Electronically signed by Herbert Pun, MD

## 2017-03-18 ENCOUNTER — Ambulatory Visit: Payer: Self-pay | Admitting: General Surgery

## 2017-03-18 ENCOUNTER — Encounter
Admission: RE | Admit: 2017-03-18 | Discharge: 2017-03-18 | Disposition: A | Discharge status: Home / Self Care | Payer: Medicare HMO | Source: Ambulatory Visit | Attending: General Surgery | Admitting: General Surgery

## 2017-03-18 ENCOUNTER — Other Ambulatory Visit: Payer: Self-pay

## 2017-03-18 DIAGNOSIS — I10 Essential (primary) hypertension: Secondary | ICD-10-CM | POA: Diagnosis not present

## 2017-03-18 DIAGNOSIS — R001 Bradycardia, unspecified: Secondary | ICD-10-CM | POA: Insufficient documentation

## 2017-03-18 DIAGNOSIS — Z01810 Encounter for preprocedural cardiovascular examination: Secondary | ICD-10-CM | POA: Diagnosis not present

## 2017-03-18 HISTORY — DX: Fatty (change of) liver, not elsewhere classified: K76.0

## 2017-03-18 HISTORY — DX: Essential (primary) hypertension: I10

## 2017-03-18 NOTE — Patient Instructions (Signed)
Your procedure is scheduled on: Monday 03/25/17 Report to [place]. To find out your arrival time please call [phone] between 1PM - 3PM on Friday 03/22/17.  Remember: Instructions that are not followed completely may result in serious medical risk, up to and including death, or upon the discretion of your surgeon and anesthesiologist your surgery may need to be rescheduled.     _X__ 1. Do not eat food after midnight the night before your procedure.                 No gum chewing or hard candies. You may drink clear liquids up to 2 hours                 before you are scheduled to arrive for your surgery- DO not drink clear                 liquids within 2 hours of the start of your surgery.                 Clear Liquids include:  water, apple juice without pulp, clear carbohydrate                 drink such as Clearfast of Gartorade, Black Coffee or Tea (Do not add                 anything to coffee or tea).  __X__2.  On the morning of surgery brush your teeth with toothpaste and water, you                 may rinse your mouth with mouthwash if you wish.  Do not swallow any              toothpaste of mouthwash.     _X__ 3.  No Alcohol for 24 hours before or after surgery.   _X__ 4.  Do Not Smoke or use e-cigarettes For 24 Hours Prior to Your Surgery.                 Do not use any chewable tobacco products for at least 6 hours prior to                 surgery.  ____  5.  Bring all medications with you on the day of surgery if instructed.   ____  6.  Notify your doctor if there is any change in your medical condition      (cold, fever, infections).     Do not wear jewelry, make-up, hairpins, clips or nail polish. Do not wear lotions, powders, or perfumes. You may wear deodorant. Do not shave 48 hours prior to surgery. Men may shave face and neck. Do not bring valuables to the hospital.    [facility] is  not responsible for any belongings or valuables.  Contacts, dentures or bridgework may not be worn into surgery. Leave your suitcase in the car. After surgery it may be brought to your room. For patients admitted to the hospital, discharge time is determined by your treatment team.   Patients discharged the day of surgery will not be allowed to drive home.   Please read over the following fact sheets that you were given:   MRSA Information   __X__ Take these medicines the morning of surgery with A SIP OF WATER:    1. ISOSORBIDE  2. METOPROLOL  3. FEXOFENADINE IF NEEDED  4. HYDROCODONE  IF NEEDED  5.  6.  ____ Fleet Enema (as directed)   __X__ Use CHG Soap as directed  ____ Use inhalers on the day of surgery  ____ Stop metformin 2 days prior to surgery    ____ Take 1/2 of usual insulin dose the night before surgery. No insulin the morning          of surgery.   ____ Stop Coumadin/Plavix/aspirin on   ____ Stop Anti-inflammatories on    ____ Stop supplements until after surgery.    ____ Bring C-Pap to the hospital.

## 2017-03-24 MED ORDER — CEFAZOLIN SODIUM-DEXTROSE 2-4 GM/100ML-% IV SOLN
2.0000 g | INTRAVENOUS | Status: AC
  Administered 2017-03-25: 2 g via INTRAVENOUS

## 2017-03-25 ENCOUNTER — Encounter
Admission: RE | Admit: 2017-03-25 | Discharge: 2017-03-25 | Disposition: A | Discharge status: Home / Self Care | Payer: Medicare HMO | Source: Ambulatory Visit | Attending: General Surgery | Admitting: General Surgery

## 2017-03-25 ENCOUNTER — Ambulatory Visit
Admission: RE | Admit: 2017-03-25 | Discharge: 2017-03-25 | Disposition: A | Discharge status: Home / Self Care | Payer: Medicare HMO | Source: Ambulatory Visit | Attending: General Surgery | Admitting: General Surgery

## 2017-03-25 ENCOUNTER — Other Ambulatory Visit: Payer: Self-pay

## 2017-03-25 ENCOUNTER — Encounter: Payer: Self-pay | Admitting: *Deleted

## 2017-03-25 ENCOUNTER — Ambulatory Visit: Payer: Medicare HMO | Admitting: Anesthesiology

## 2017-03-25 ENCOUNTER — Encounter
Admission: RE | Disposition: A | Discharge status: Home / Self Care | Payer: Self-pay | Source: Ambulatory Visit | Attending: General Surgery

## 2017-03-25 DIAGNOSIS — Z9842 Cataract extraction status, left eye: Secondary | ICD-10-CM | POA: Diagnosis not present

## 2017-03-25 DIAGNOSIS — Z820 Family history of epilepsy and other diseases of the nervous system: Secondary | ICD-10-CM | POA: Insufficient documentation

## 2017-03-25 DIAGNOSIS — Z955 Presence of coronary angioplasty implant and graft: Secondary | ICD-10-CM | POA: Diagnosis not present

## 2017-03-25 DIAGNOSIS — H409 Unspecified glaucoma: Secondary | ICD-10-CM | POA: Insufficient documentation

## 2017-03-25 DIAGNOSIS — E782 Mixed hyperlipidemia: Secondary | ICD-10-CM | POA: Insufficient documentation

## 2017-03-25 DIAGNOSIS — M8580 Other specified disorders of bone density and structure, unspecified site: Secondary | ICD-10-CM | POA: Diagnosis not present

## 2017-03-25 DIAGNOSIS — Z823 Family history of stroke: Secondary | ICD-10-CM | POA: Diagnosis not present

## 2017-03-25 DIAGNOSIS — Z170 Estrogen receptor positive status [ER+]: Secondary | ICD-10-CM | POA: Insufficient documentation

## 2017-03-25 DIAGNOSIS — C50911 Malignant neoplasm of unspecified site of right female breast: Secondary | ICD-10-CM | POA: Diagnosis not present

## 2017-03-25 DIAGNOSIS — I10 Essential (primary) hypertension: Secondary | ICD-10-CM | POA: Diagnosis not present

## 2017-03-25 DIAGNOSIS — Z7982 Long term (current) use of aspirin: Secondary | ICD-10-CM | POA: Diagnosis not present

## 2017-03-25 DIAGNOSIS — D493 Neoplasm of unspecified behavior of breast: Secondary | ICD-10-CM | POA: Diagnosis not present

## 2017-03-25 DIAGNOSIS — Z79899 Other long term (current) drug therapy: Secondary | ICD-10-CM | POA: Insufficient documentation

## 2017-03-25 DIAGNOSIS — I2510 Atherosclerotic heart disease of native coronary artery without angina pectoris: Secondary | ICD-10-CM | POA: Insufficient documentation

## 2017-03-25 DIAGNOSIS — Z90710 Acquired absence of both cervix and uterus: Secondary | ICD-10-CM | POA: Diagnosis not present

## 2017-03-25 DIAGNOSIS — F411 Generalized anxiety disorder: Secondary | ICD-10-CM | POA: Insufficient documentation

## 2017-03-25 DIAGNOSIS — Z807 Family history of other malignant neoplasms of lymphoid, hematopoietic and related tissues: Secondary | ICD-10-CM | POA: Insufficient documentation

## 2017-03-25 DIAGNOSIS — E7800 Pure hypercholesterolemia, unspecified: Secondary | ICD-10-CM | POA: Diagnosis not present

## 2017-03-25 DIAGNOSIS — F419 Anxiety disorder, unspecified: Secondary | ICD-10-CM | POA: Diagnosis not present

## 2017-03-25 DIAGNOSIS — M5136 Other intervertebral disc degeneration, lumbar region: Secondary | ICD-10-CM | POA: Insufficient documentation

## 2017-03-25 DIAGNOSIS — Z6838 Body mass index (BMI) 38.0-38.9, adult: Secondary | ICD-10-CM | POA: Diagnosis not present

## 2017-03-25 DIAGNOSIS — E669 Obesity, unspecified: Secondary | ICD-10-CM | POA: Diagnosis not present

## 2017-03-25 DIAGNOSIS — C50411 Malignant neoplasm of upper-outer quadrant of right female breast: Secondary | ICD-10-CM

## 2017-03-25 DIAGNOSIS — Z7902 Long term (current) use of antithrombotics/antiplatelets: Secondary | ICD-10-CM | POA: Diagnosis not present

## 2017-03-25 DIAGNOSIS — M654 Radial styloid tenosynovitis [de Quervain]: Secondary | ICD-10-CM | POA: Diagnosis not present

## 2017-03-25 DIAGNOSIS — E538 Deficiency of other specified B group vitamins: Secondary | ICD-10-CM | POA: Diagnosis not present

## 2017-03-25 DIAGNOSIS — Z881 Allergy status to other antibiotic agents status: Secondary | ICD-10-CM | POA: Diagnosis not present

## 2017-03-25 DIAGNOSIS — K760 Fatty (change of) liver, not elsewhere classified: Secondary | ICD-10-CM | POA: Insufficient documentation

## 2017-03-25 DIAGNOSIS — I252 Old myocardial infarction: Secondary | ICD-10-CM | POA: Insufficient documentation

## 2017-03-25 DIAGNOSIS — E559 Vitamin D deficiency, unspecified: Secondary | ICD-10-CM | POA: Diagnosis not present

## 2017-03-25 DIAGNOSIS — Z803 Family history of malignant neoplasm of breast: Secondary | ICD-10-CM | POA: Insufficient documentation

## 2017-03-25 HISTORY — PX: BREAST LUMPECTOMY: SHX2

## 2017-03-25 HISTORY — PX: PARTIAL MASTECTOMY WITH NEEDLE LOCALIZATION: SHX6008

## 2017-03-25 HISTORY — PX: SENTINEL NODE BIOPSY: SHX6608

## 2017-03-25 SURGERY — PARTIAL MASTECTOMY WITH NEEDLE LOCALIZATION
Anesthesia: General | Laterality: Right | Wound class: Clean

## 2017-03-25 MED ORDER — GLYCOPYRROLATE 0.2 MG/ML IJ SOLN
INTRAMUSCULAR | Status: AC
  Filled 2017-03-25: qty 1

## 2017-03-25 MED ORDER — TECHNETIUM TC 99M SULFUR COLLOID FILTERED: 1.0000 | Freq: Once | INTRAVENOUS | Status: DC | PRN

## 2017-03-25 MED ORDER — OXYCODONE HCL 5 MG/5ML PO SOLN: 5.0000 mg | Freq: Once | ORAL | Status: DC | PRN

## 2017-03-25 MED ORDER — SCOPOLAMINE 1 MG/3DAYS TD PT72
MEDICATED_PATCH | TRANSDERMAL | Status: AC
  Filled 2017-03-25: qty 1

## 2017-03-25 MED ORDER — LIDOCAINE HCL (PF) 2 % IJ SOLN
INTRAMUSCULAR | Status: AC
  Filled 2017-03-25: qty 10

## 2017-03-25 MED ORDER — PROPOFOL 10 MG/ML IV BOLUS
INTRAVENOUS | Status: AC
  Filled 2017-03-25: qty 20

## 2017-03-25 MED ORDER — FAMOTIDINE 20 MG PO TABS
ORAL_TABLET | ORAL | Status: AC
  Filled 2017-03-25: qty 1

## 2017-03-25 MED ORDER — OXYCODONE HCL 5 MG PO TABS: 5.0000 mg | ORAL_TABLET | Freq: Once | ORAL | Status: DC | PRN

## 2017-03-25 MED ORDER — DEXAMETHASONE SODIUM PHOSPHATE 10 MG/ML IJ SOLN
INTRAMUSCULAR | Status: AC
  Filled 2017-03-25: qty 1

## 2017-03-25 MED ORDER — DEXAMETHASONE SODIUM PHOSPHATE 10 MG/ML IJ SOLN
INTRAMUSCULAR | Status: DC | PRN
  Administered 2017-03-25: 10 mg via INTRAVENOUS

## 2017-03-25 MED ORDER — HYDROCODONE-ACETAMINOPHEN 7.5-325 MG PO TABS: 1.0000 | ORAL_TABLET | Freq: Four times a day (QID) | ORAL | Status: DC | PRN

## 2017-03-25 MED ORDER — ACETAMINOPHEN 10 MG/ML IV SOLN
INTRAVENOUS | Status: AC
  Filled 2017-03-25: qty 100

## 2017-03-25 MED ORDER — ONDANSETRON HCL 4 MG/2ML IJ SOLN
INTRAMUSCULAR | Status: AC
  Filled 2017-03-25: qty 2

## 2017-03-25 MED ORDER — ACETAMINOPHEN 10 MG/ML IV SOLN
INTRAVENOUS | Status: DC | PRN
  Administered 2017-03-25: 1000 mg via INTRAVENOUS

## 2017-03-25 MED ORDER — ONDANSETRON HCL 4 MG/2ML IJ SOLN
INTRAMUSCULAR | Status: DC | PRN
  Administered 2017-03-25: 4 mg via INTRAVENOUS

## 2017-03-25 MED ORDER — FENTANYL CITRATE (PF) 100 MCG/2ML IJ SOLN
INTRAMUSCULAR | Status: DC | PRN
  Administered 2017-03-25 (×2): 50 ug via INTRAVENOUS

## 2017-03-25 MED ORDER — GLYCOPYRROLATE 0.2 MG/ML IJ SOLN
INTRAMUSCULAR | Status: DC | PRN
  Administered 2017-03-25: 0.2 mg via INTRAVENOUS

## 2017-03-25 MED ORDER — MEPERIDINE HCL 50 MG/ML IJ SOLN: 6.2500 mg | INTRAMUSCULAR | Status: DC | PRN

## 2017-03-25 MED ORDER — BUPIVACAINE-EPINEPHRINE (PF) 0.5% -1:200000 IJ SOLN
INTRAMUSCULAR | Status: AC
  Filled 2017-03-25: qty 30

## 2017-03-25 MED ORDER — FENTANYL CITRATE (PF) 100 MCG/2ML IJ SOLN: 25.0000 ug | INTRAMUSCULAR | Status: DC | PRN

## 2017-03-25 MED ORDER — FAMOTIDINE 20 MG PO TABS
20.0000 mg | ORAL_TABLET | Freq: Once | ORAL | Status: AC
Start: 2017-03-25 — End: 2017-03-25
  Administered 2017-03-25: 20 mg via ORAL

## 2017-03-25 MED ORDER — PROMETHAZINE HCL 25 MG/ML IJ SOLN: 6.2500 mg | INTRAMUSCULAR | Status: DC | PRN

## 2017-03-25 MED ORDER — SCOPOLAMINE 1 MG/3DAYS TD PT72
1.0000 | MEDICATED_PATCH | TRANSDERMAL | Status: DC
  Administered 2017-03-25: 1.5 mg via TRANSDERMAL

## 2017-03-25 MED ORDER — PHENYLEPHRINE HCL 10 MG/ML IJ SOLN
INTRAMUSCULAR | Status: DC | PRN
  Administered 2017-03-25 (×2): 100 ug via INTRAVENOUS
  Administered 2017-03-25 (×2): 200 ug via INTRAVENOUS

## 2017-03-25 MED ORDER — CEFAZOLIN SODIUM-DEXTROSE 2-4 GM/100ML-% IV SOLN
INTRAVENOUS | Status: AC
  Filled 2017-03-25: qty 100

## 2017-03-25 MED ORDER — MIDAZOLAM HCL 2 MG/2ML IJ SOLN
INTRAMUSCULAR | Status: DC | PRN
  Administered 2017-03-25: 2 mg via INTRAVENOUS

## 2017-03-25 MED ORDER — BUPIVACAINE-EPINEPHRINE 0.5% -1:200000 IJ SOLN
INTRAMUSCULAR | Status: DC | PRN
  Administered 2017-03-25: 10 mL

## 2017-03-25 MED ORDER — PROPOFOL 10 MG/ML IV BOLUS
INTRAVENOUS | Status: DC | PRN
  Administered 2017-03-25: 100 mg via INTRAVENOUS
  Administered 2017-03-25: 150 mg via INTRAVENOUS
  Administered 2017-03-25: 50 mg via INTRAVENOUS

## 2017-03-25 MED ORDER — FENTANYL CITRATE (PF) 100 MCG/2ML IJ SOLN
INTRAMUSCULAR | Status: AC
  Filled 2017-03-25: qty 2

## 2017-03-25 MED ORDER — MIDAZOLAM HCL 2 MG/2ML IJ SOLN
INTRAMUSCULAR | Status: AC
  Filled 2017-03-25: qty 2

## 2017-03-25 MED ORDER — LACTATED RINGERS IV SOLN
INTRAVENOUS | Status: DC
  Administered 2017-03-25 (×3): via INTRAVENOUS

## 2017-03-25 MED ORDER — LIDOCAINE HCL (CARDIAC) 20 MG/ML IV SOLN
INTRAVENOUS | Status: DC | PRN
  Administered 2017-03-25: 100 mg via INTRAVENOUS

## 2017-03-25 SURGICAL SUPPLY — 35 items
ADH SKN CLS APL DERMABOND .7 (GAUZE/BANDAGES/DRESSINGS) ×1
BLADE SURG 15 STRL LF DISP TIS (BLADE) ×1 IMPLANT
BLADE SURG 15 STRL SS (BLADE) ×3
CANISTER SUCT 1200ML W/VALVE (MISCELLANEOUS) ×3 IMPLANT
CHLORAPREP W/TINT 26ML (MISCELLANEOUS) ×3 IMPLANT
CNTNR SPEC 2.5X3XGRAD LEK (MISCELLANEOUS) ×1
CONT SPEC 4OZ STER OR WHT (MISCELLANEOUS) ×2
CONT SPEC 4OZ STRL OR WHT (MISCELLANEOUS) ×1
CONTAINER SPEC 2.5X3XGRAD LEK (MISCELLANEOUS) IMPLANT
DERMABOND ADVANCED (GAUZE/BANDAGES/DRESSINGS) ×2
DERMABOND ADVANCED .7 DNX12 (GAUZE/BANDAGES/DRESSINGS) ×1 IMPLANT
DEVICE DUBIN SPECIMEN MAMMOGRA (MISCELLANEOUS) ×3 IMPLANT
DRAPE LAPAROTOMY 77X122 PED (DRAPES) ×3 IMPLANT
ELECT REM PT RETURN 9FT ADLT (ELECTROSURGICAL) ×3
ELECTRODE REM PT RTRN 9FT ADLT (ELECTROSURGICAL) ×1 IMPLANT
GLOVE BIO SURGEON STRL SZ 6.5 (GLOVE) ×2 IMPLANT
GLOVE BIO SURGEONS STRL SZ 6.5 (GLOVE) ×1
GOWN STRL REUS W/ TWL LRG LVL3 (GOWN DISPOSABLE) ×2 IMPLANT
GOWN STRL REUS W/TWL LRG LVL3 (GOWN DISPOSABLE) ×6
KIT TURNOVER KIT A (KITS) ×3 IMPLANT
LABEL OR SOLS (LABEL) ×3 IMPLANT
MARGIN MAP 10MM (MISCELLANEOUS) ×3 IMPLANT
NDL HYPO 25X1 1.5 SAFETY (NEEDLE) ×1 IMPLANT
NEEDLE HYPO 25X1 1.5 SAFETY (NEEDLE) ×3 IMPLANT
PACK BASIN MINOR ARMC (MISCELLANEOUS) ×3 IMPLANT
SUT ETHILON 3-0 FS-10 30 BLK (SUTURE) ×3
SUT MNCRL 4-0 (SUTURE) ×3
SUT MNCRL 4-0 27XMFL (SUTURE) ×1
SUT SILK 2 0 SH (SUTURE) ×6 IMPLANT
SUT VIC AB 3-0 SH 27 (SUTURE) ×3
SUT VIC AB 3-0 SH 27X BRD (SUTURE) ×1 IMPLANT
SUTURE EHLN 3-0 FS-10 30 BLK (SUTURE) ×1 IMPLANT
SUTURE MNCRL 4-0 27XMF (SUTURE) ×1 IMPLANT
SYR 10ML LL (SYRINGE) ×3 IMPLANT
WATER STERILE IRR 1000ML POUR (IV SOLUTION) ×3 IMPLANT

## 2017-03-25 NOTE — Anesthesia Procedure Notes (Signed)
Procedure Name: Intubation Date/Time: 03/25/2017 10:54 AM Performed by: Silvana Newness, CRNA Pre-anesthesia Checklist: Patient identified, Emergency Drugs available, Suction available, Patient being monitored and Timeout performed Patient Re-evaluated:Patient Re-evaluated prior to induction Oxygen Delivery Method: Circle system utilized Preoxygenation: Pre-oxygenation with 100% oxygen Induction Type: IV induction Ventilation: Mask ventilation without difficulty Laryngoscope Size: Mac and 3 Grade View: Grade II Tube type: Oral Tube size: 7.0 mm Number of attempts: 1 Airway Equipment and Method: Stylet Placement Confirmation: ETT inserted through vocal cords under direct vision,  positive ETCO2 and breath sounds checked- equal and bilateral Secured at: 21 cm Tube secured with: Tape Dental Injury: Teeth and Oropharynx as per pre-operative assessment  Comments: Attempted placement of LMA 4, large leak, removed.  Attempted placement LMA 3.5, large leak, removed.  ETT placed.  Patient does have small mouth opening

## 2017-03-25 NOTE — Transfer of Care (Signed)
Immediate Anesthesia Transfer of Care Note  Patient: Linda Yang  Procedure(s) Performed: PARTIAL MASTECTOMY WITH NEEDLE LOCALIZATION (Right ) SENTINEL NODE BIOPSY (Right )  Patient Location: PACU  Anesthesia Type:General  Level of Consciousness: drowsy and patient cooperative  Airway & Oxygen Therapy: Patient Spontanous Breathing and Patient connected to face mask oxygen  Post-op Assessment: Report given to RN and Post -op Vital signs reviewed and stable  Post vital signs: Reviewed and stable  Last Vitals:  Vitals:   03/25/17 0900 03/25/17 1325  BP: 120/71 (!) 149/72  Pulse: (!) 56 81  Resp: 16 13  Temp: 36.7 C 36.9 C  SpO2: 98% 97%    Last Pain:  Vitals:   03/25/17 1325  TempSrc:   PainSc: Asleep         Complications: No apparent anesthesia complications

## 2017-03-25 NOTE — Op Note (Signed)
Preoperative diagnosis: Right breast invasive mammary carcinoma.  Postoperative diagnosis: Right breast invasive mammary carcinoma.   Procedure: Right needle-localized breast partial mastectomy.                       Right Axillary Sentinel Lymph node biopsy  Anesthesia: GETA  Surgeon: Dr. Windell Moment  Wound Classification: Clean  Indications: Patient is a 77 y.o. female with a nonpalpable right breast mass noted on mammography with core biopsy demonstrating invasive mammary carcinoma requires needle-localized lumpectomy for treatment with sentinel lymph node biopsy oncologic staging.   Findings: 1. Specimen mammography shows marker and wire on specimen 2. Pathology call refers gross examination of margins was grossly negative with anterior side 46mm from margin 3. Anterior border reexcised to get better margin.  4. Three sentinel lymph nodes removed. No palpable mass or lymph node identified.   Description of procedure: Preoperative needle localization was performed by radiology. In the nuclear medicine suite, the subareolar region was injected with Tc-99 sulfur colloid. Localization studies were reviewed. The patient was taken to the operating room and placed supine on the operating table, and after general anesthesia the right chest and axilla were prepped and draped in the usual sterile fashion. A time-out was completed verifying correct patient, procedure, site, positioning, and implant(s) and/or special equipment prior to beginning this procedure.  By comparing the localization studies with the direction and skin entry site of the needle, the probable trajectory and location of the mass was visualized. A circumareolar skin incision was planned in such a way as to minimize the amount of dissection to reach the mass.  The skin incision was made. Flaps were raised and the location of the wire confirmed. The wire was delivered into the wound. A 2-0 silk figure-of-eight stay suture was placed  around the wire and used for retraction. Dissection was then taken down circumferentially, taking care to include the entire localizing needle and a wide margin of grossly normal tissue. The specimen and entire localizing wire were removed. The specimen was oriented and sent to radiology with the localization studies. Confirmation was received that the entire target lesion had been resected. Pathology called and notified that the margins are grossly negative, the closest being the anterior border which was 5 mm from the margin. The wound was irrigated. Hemostasis was checked.  Since the wound was lateral, from the same wound, A hand-held gamma probe was used to identify the location of the hottest spot in the axilla. Prior to opening the axillary fascia, the counts were 462. An incision was made on the axillary fascia. Dissection was carried down until axillary fat was advanced. The probe was placed and again, the point of maximal count was found. Dissection continue until nodule was identified. The probe was placed in contact with the node and 2450 counts were recorded. The node was excised in its entirety. Ex vivo, the node measured 2332 counts when placed on the probe. The bed of the node measured 18 counts. An additional hot spot was detected and the node was excised in similar fashion. The following counts registered: 174 prior to excision, 520 ex vivo node, and 0 residual bed. A third sentinel lymph node was identified with the following count: 220 prior to excision, 218 ex vivo node, and 0 residual bed. No additional hot spots were identified. No clinically abnormal nodes were palpated. The procedure was terminated. Hemostasis was achieved and the wound closed in layers with deep interrupted 3-0 Vicryl and  skin was closed with subcuticular suture of Monocryl 3-0.  The patient tolerated the procedure well and was taken to the postanesthesia care unit in stable condition.   Specimen: Right Breast Tissue  (Orientation markers used: Cranial, Caudal, Medial, Lateral,Skin, Deep)                    Right axilla Sentinel Lymph nodes #1, #2, #3.   Complications: None  Estimated Blood Loss: 72mL

## 2017-03-25 NOTE — Anesthesia Preprocedure Evaluation (Addendum)
Anesthesia Evaluation  Patient identified by MRN, date of birth, ID band Patient awake    Reviewed: Allergy & Precautions, NPO status , Patient's Chart, lab work & pertinent test results  History of Anesthesia Complications (+) PONV and history of anesthetic complications (only after hysterectomy)  Airway Mallampati: III  TM Distance: >3 FB Neck ROM: Full    Dental  (+) Caps   Pulmonary shortness of breath and with exertion, neg sleep apnea, neg COPD,    breath sounds clear to auscultation- rhonchi (-) wheezing      Cardiovascular Exercise Tolerance: Good hypertension, Pt. on medications (-) angina+ CAD, + Past MI and + Cardiac Stents (06/2016)   Rhythm:Regular Rate:Normal - Systolic murmurs and - Diastolic murmurs    Neuro/Psych Anxiety CVA, No Residual Symptoms    GI/Hepatic negative GI ROS, Fatty liver    Endo/Other  negative endocrine ROSneg diabetes  Renal/GU negative Renal ROS     Musculoskeletal negative musculoskeletal ROS (+)   Abdominal (+) + obese,   Peds  Hematology negative hematology ROS (+)   Anesthesia Other Findings Past Medical History: No date: Anginal pain ([place])     Comment:  occas. took ntg last week No date: Anxiety No date: Complication of anesthesia No date: Coronary artery disease No date: Dyspnea     Comment:  doe No date: Fatty liver No date: High cholesterol No date: Hypertension No date: Myocardial infarction [facility])     Comment:  5/18 No date: PONV (postoperative nausea and vomiting)   Reproductive/Obstetrics                            Anesthesia Physical Anesthesia Plan  ASA: III  Anesthesia Plan: General   Post-op Pain Management:    Induction: Intravenous  PONV Risk Score and Plan: 3 and Dexamethasone, Ondansetron, Midazolam and Scopolamine patch - Pre-op  Airway Management Planned: LMA  Additional Equipment:   Intra-op Plan:    Post-operative Plan:   Informed Consent: I have reviewed the patients History and Physical, chart, labs and discussed the procedure including the risks, benefits and alternatives for the proposed anesthesia with the patient or authorized representative who has indicated his/her understanding and acceptance.   Dental advisory given  Plan Discussed with: CRNA and Anesthesiologist  Anesthesia Plan Comments:         Anesthesia Quick Evaluation

## 2017-03-25 NOTE — Anesthesia Post-op Follow-up Note (Signed)
Anesthesia QCDR form completed.        

## 2017-03-25 NOTE — Anesthesia Postprocedure Evaluation (Signed)
Anesthesia Post Note  Patient: Linda Yang  Procedure(s) Performed: PARTIAL MASTECTOMY WITH NEEDLE LOCALIZATION (Right ) SENTINEL NODE BIOPSY (Right )  Patient location during evaluation: PACU Anesthesia Type: General Level of consciousness: awake and alert and oriented Pain management: pain level controlled Vital Signs Assessment: post-procedure vital signs reviewed and stable Respiratory status: spontaneous breathing, nonlabored ventilation and respiratory function stable Cardiovascular status: blood pressure returned to baseline and stable Postop Assessment: no signs of nausea or vomiting Anesthetic complications: no     Last Vitals:  Vitals:   03/25/17 1417 03/25/17 1502  BP: 118/70 (!) 110/57  Pulse: 75 71  Resp: 16   Temp: (!) 35.7 C   SpO2: 92% 97%    Last Pain:  Vitals:   03/25/17 1502  TempSrc:   PainSc: 2                  Delos Klich

## 2017-03-25 NOTE — Discharge Instructions (Signed)
°  Diet: Resume home heart healthy regular diet.   Activity: No heavy lifting > 20 pounds (children, pets, laundry, garbage) or strenuous activity until follow-up with right upper extremity, but light activity and walking are encouraged. Do not drive or drink alcohol if taking narcotic pain medications.  Wound care: May shower with soapy water and pat dry (do not rub incisions), but no baths or submerging incision underwater until follow-up. (no swimming)   Medications: Resume all home medications except Aspirin and Plavix for 48 hours after surgery. For mild to moderate pain: acetaminophen (Tylenol) or ibuprofen (if no kidney disease). Combining Tylenol with alcohol can substantially increase your risk of causing liver disease. Narcotic pain medications, if prescribed, can be used for severe pain, though may cause nausea, constipation, and drowsiness. If you do not need the narcotic pain medication, you do not need to fill the prescription.  Restart aspirin and plavix 48 hours after surgery or as directed by Cardiologist.   Call office [phone]) at any time if any questions, worsening pain, fevers/chills, bleeding, drainage from incision site, or other concerns.

## 2017-03-25 NOTE — Interval H&P Note (Signed)
History and Physical Interval Note:  03/25/2017 10:04 AM  Doristine Devoid  has presented today for surgery, with the diagnosis of NEOPLASM RIGHT BREAST  The various methods of treatment have been discussed with the patient and family. After consideration of risks, benefits and other options for treatment, the patient has consented to  Procedure(s): PARTIAL MASTECTOMY WITH NEEDLE LOCALIZATION (Right) SENTINEL NODE BIOPSY (Right) as a surgical intervention .  The patient's history has been reviewed, patient examined, no change in status, stable for surgery.  I have reviewed the patient's chart and labs.  Correct site marked on the pre procedure room. Questions were answered to the patient's satisfaction.     Herbert Pun

## 2017-03-26 ENCOUNTER — Encounter: Payer: Self-pay | Admitting: General Surgery

## 2017-03-27 LAB — SURGICAL PATHOLOGY

## 2017-04-02 ENCOUNTER — Ambulatory Visit: Admission: RE | Admit: 2017-04-02 | Payer: Medicare HMO | Source: Ambulatory Visit | Admitting: Ophthalmology

## 2017-04-02 ENCOUNTER — Encounter: Admission: RE | Payer: Self-pay | Source: Ambulatory Visit

## 2017-04-02 SURGERY — PHACOEMULSIFICATION, CATARACT, WITH IOL INSERTION
Anesthesia: Choice | Laterality: Right

## 2017-04-03 NOTE — Progress Notes (Signed)
  Oncology Nurse Navigator Documentation  Navigator Location: [facility] (04/03/17 1000)   )Navigator Encounter Type: Telephone (04/03/17 1000) Telephone: Incoming Call;Outgoing Call;Appt Confirmation/Clarification;Symptom Mgt (04/03/17 1000)                   Patient Visit Type: Follow-up (04/03/17 1000)       Interventions: Coordination of Care;Education (04/03/17 1000)                      Time Spent with Patient: 30 (04/03/17 1000)   Patient called with concerns about being exposed to illness when out in public.  She plans to wear mask, and frequent handwashing encouraged.  She also reported she had overlapping appointments with Dr. Baruch Gouty, and Dr. Peyton Najjar on 04/09/17.  Appointment with Dr. Peyton Najjar moved to 2:45 on 04/09/08.  Patient aware of change.

## 2017-04-07 NOTE — Progress Notes (Unsigned)
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  INTERVAL HISTORY: Patient is a 77 year old female who was noted to have an abnormality on routine screening mammogram.  Subsequent ultrasound and biopsy revealed the above-stated malignancy.  Currently, she is anxious but otherwise feels well.  She has no neurologic complaints.  She denies any recent fevers or illnesses.  She has a good appetite and denies weight loss.  She has no chest pain or shortness of breath.  She denies any nausea, vomiting, constipation, or diarrhea.  She has no urinary complaints.  Patient feels at her baseline offers no specific complaints today.  REVIEW OF SYSTEMS:   Review of Systems  Constitutional: Negative.  Negative for fever, malaise/fatigue and weight loss.  Respiratory: Negative.  Negative for cough and shortness of breath.   Cardiovascular: Negative.  Negative for chest pain and leg swelling.  Gastrointestinal: Negative.  Negative for abdominal pain.  Genitourinary: Negative.   Musculoskeletal: Negative.  Negative for back pain.  Skin: Negative.  Negative for rash.  Neurological: Negative.  Negative for sensory change and weakness.  Psychiatric/Behavioral: The patient is nervous/anxious.     As per HPI. Otherwise, a complete review of systems is negative.  PAST MEDICAL HISTORY: Past Medical History:  Diagnosis Date  . Anginal pain ([place])    occas. took ntg last week  . Anxiety   . Complication of anesthesia   . Coronary artery disease   . Dyspnea    doe  . Fatty liver   . High cholesterol   . Hypertension   . Myocardial infarction ([place])    5/18  . PONV (postoperative nausea and vomiting)      PAST SURGICAL HISTORY: Past Surgical History:  Procedure Laterality Date  . ABDOMINAL HYSTERECTOMY    . ACHILLES TENDON SURGERY    . BREAST BIOPSY Right 03/05/2017   Affirm Bx- invasive mammary  . BREAST LUMPECTOMY Right 03/25/2017   invasive mammary  . BUNIONECTOMY Bilateral   . CATARACT EXTRACTION W/PHACO Left 01/29/2017   Procedure: CATARACT EXTRACTION PHACO AND INTRAOCULAR LENS PLACEMENT (IOC);  Surgeon: Birder Robson, MD;  Location: [facility];  Service: Ophthalmology;  Laterality: Left;  [place] 00:49.9 AP% 20.3 CDE 10.11 Fluid Pack lot # [id number]  . CORONARY ANGIOPLASTY     STENT 5/18  . CORONARY STENT INTERVENTION N/A 07/18/2016   Procedure: Coronary Stent Intervention;  Surgeon: Yolonda Kida, MD;  Location: [place] [facility];  Service: Cardiovascular;  Laterality: N/A;  . CTR    . INTRAVASCULAR PRESSURE WIRE/FFR STUDY N/A 07/18/2016   Procedure: Intravascular Pressure Wire/FFR Study;  Surgeon: Yolonda Kida, MD;  Location: [place] [facility];  Service: Cardiovascular;  Laterality: N/A;  . LEFT HEART CATH AND CORONARY ANGIOGRAPHY N/A 07/18/2016   Procedure: Left Heart Cath and Coronary Angiography;  Surgeon: Yolonda Kida, MD;  Location: [place] [facility];  Service: Cardiovascular;  Laterality: N/A;  . PARTIAL MASTECTOMY WITH NEEDLE LOCALIZATION Right 03/25/2017   Procedure: PARTIAL MASTECTOMY WITH NEEDLE LOCALIZATION;  Surgeon: Herbert Pun, MD;  Location: [facility];  Service: General;  Laterality: Right;  . SENTINEL NODE BIOPSY Right 03/25/2017   Procedure: SENTINEL NODE BIOPSY;  Surgeon: Herbert Pun, MD;  Location: [facility];  Service: General;  Laterality: Right;  . TRIGGER FINGER RELEASE Bilateral     FAMILY HISTORY: Family History  Problem Relation Age of Onset  . Breast cancer Paternal Aunt        post men.     ADVANCED DIRECTIVES (Y/N):  N  HEALTH MAINTENANCE: Social History   Tobacco Use  . Smoking status: Never Smoker    . Smokeless tobacco: Never Used  Substance Use Topics  . Alcohol use: No  . Drug use: No     Colonoscopy:  PAP:  Bone density:  Lipid panel:  Allergies  Allergen Reactions  . Metoprolol Other (See Comments)    Hypotension   . Other Diarrhea and Other (See Comments)    All mycin antibiotics   . Red Yeast Rice [Cholestin] Other (See Comments)    GI discomfort   . Statins Other (See Comments)    Myalgia, cramps     Current Outpatient Medications  Medication Sig Dispense Refill  . aspirin EC 81 MG tablet Take 81 mg by mouth daily.     Marland Kitchen atorvastatin (LIPITOR) 40 MG tablet Take 40 mg by mouth every evening.     . Calcium Carbonate-Simethicone (ALKA-SELTZER HEARTBURN + GAS) 750-80 MG CHEW Chew 1 tablet by mouth daily as needed (heartburn).    . Camphor-Menthol-Capsicum (TIGER BALM PAIN RELIEVING) 80-24-16 MG PTCH Place 1-3 patches onto the skin daily as needed (pain).     . cholecalciferol (VITAMIN D) 400 units TABS tablet Take 400 Units by mouth at bedtime.    . clopidogrel (PLAVIX) 75 MG tablet Take 75 mg by mouth daily.    Marland Kitchen docusate sodium (COLACE) 100 MG capsule Take 100 mg by mouth daily as needed (for constipation).     Marland Kitchen estradiol (ESTRACE) 0.1 MG/GM vaginal cream Place 1 Applicatorful vaginally daily as needed.    . ferrous sulfate 325 (65 FE) MG tablet Take 325 mg by mouth at bedtime.    . fexofenadine (ALLEGRA) 180 MG tablet Take 180 mg by mouth at bedtime as needed for allergies or rhinitis.    Marland Kitchen HYDROcodone-acetaminophen (NORCO/VICODIN) 5-325 MG tablet Take 1 tablet by mouth daily as needed for moderate pain.    . isosorbide mononitrate (IMDUR) 30 MG 24 hr tablet Take 30 mg by mouth daily.    . metoprolol succinate (TOPROL-XL) 25 MG 24 hr tablet Take 12.5-25 mg by mouth 2 (two) times daily. TAKE 0.5 TABLET (12.5 MG) BY MOUTH EVERY MORNING & TAKE 1 TABLET (25 MG) BY MOUTH IN THE EVENING    . mirabegron ER (MYRBETRIQ) 50 MG TB24 tablet Take 50 mg by mouth daily as needed  (for overactive bladder (typically once a week)).     . nitroGLYCERIN (NITROSTAT) 0.4 MG SL tablet Place 0.4 mg under the tongue every 5 (five) minutes as needed for chest pain.     Marland Kitchen PARoxetine (PAXIL) 20 MG tablet Take 20 mg by mouth at bedtime.    . Polyvinyl Alcohol-Povidone PF (REFRESH) 1.4-0.6 % SOLN Place 1-2 drops into both eyes 3 (three) times daily as needed (for dry eyes.).    Marland Kitchen trolamine salicylate (ASPERCREME) 10 % cream Apply 1 application topically 4 (four) times daily as needed for muscle pain.     . vitamin B-12 (CYANOCOBALAMIN) 1000 MCG tablet Take 1,000 mcg by mouth at bedtime.     No current facility-administered medications for this visit.     OBJECTIVE: There were no vitals filed for this visit.   There is no  height or weight on file to calculate BMI.    ECOG FS:0 - Asymptomatic  General: Well-developed, well-nourished, no acute distress. Eyes: Pink conjunctiva, anicteric sclera. HEENT: Normocephalic, moist mucous membranes, clear oropharnyx. Breasts: Patient requested exam deferred today. Lungs: Clear to auscultation bilaterally. Heart: Regular rate and rhythm. No rubs, murmurs, or gallops. Abdomen: Soft, nontender, nondistended. No organomegaly noted, normoactive bowel sounds. Musculoskeletal: No edema, cyanosis, or clubbing. Neuro: Alert, answering all questions appropriately. Cranial nerves grossly intact. Skin: No rashes or petechiae noted. Psych: Normal affect. Lymphatics: No cervical, calvicular, axillary or inguinal LAD.   LAB RESULTS:  Lab Results  Component Value Date   NA 139 01/20/2017   K 4.5 01/20/2017   CL 104 01/20/2017   CO2 26 01/20/2017   GLUCOSE 108 (H) 01/20/2017   BUN 10 01/20/2017   CREATININE 0.63 01/20/2017   CALCIUM 9.0 01/20/2017   PROT 8.1 08/11/2011   ALBUMIN 3.8 08/11/2011   AST 24 08/11/2011   ALT 32 08/11/2011   ALKPHOS 72 08/11/2011   BILITOT 0.6 08/11/2011   GFRNONAA >60 01/20/2017   GFRAA >60 01/20/2017    Lab  Results  Component Value Date   WBC 9.6 01/20/2017   NEUTROABS 6.1 01/20/2017   HGB 14.8 01/20/2017   HCT 44.3 01/20/2017   MCV 94.9 01/20/2017   PLT 291 01/20/2017     STUDIES: Ct Head Wo Contrast  Result Date: 03/08/2017 CLINICAL DATA:  77 year old female with visual hallucinations intermittently for 1 year. EXAM: CT HEAD WITHOUT CONTRAST TECHNIQUE: Contiguous axial images were obtained from the base of the skull through the vertex without intravenous contrast. COMPARISON:  Brain MRI 07/16/2009. FINDINGS: Brain: Cerebral volume is within normal limits for age. No midline shift, ventriculomegaly, mass effect, evidence of mass lesion, intracranial hemorrhage or evidence of cortically based acute infarction. Gray-white matter differentiation is within normal limits throughout the brain. No cortical encephalomalacia identified. Bilateral occipital lobes appear normal. No suprasellar mass or mass effect. Vascular: Calcified atherosclerosis at the skull base. No suspicious intracranial vascular hyperdensity. Skull: Negative.  No acute osseous abnormality identified. Sinuses/Orbits: Visualized paranasal sinuses and mastoids are stable and well pneumatized. Other: Visible orbits soft tissues appear normal. Visualized scalp soft tissues are within normal limits. IMPRESSION: Normal for age non contrast CT appearance of the brain. No explanation for abnormal vision. Electronically Signed   By: Genevie Ann M.D.   On: 03/08/2017 17:23   Nm Sentinel Node Injection  Result Date: 03/25/2017 CLINICAL DATA:  Right breast cancer. EXAM: NUCLEAR MEDICINE BREAST LYMPHOSCINTIGRAPHY TECHNIQUE: Intradermal injection of radiopharmaceutical was performed at the 12 o'clock, 3 o'clock, 6 o'clock, and 9 o'clock positions around the right nipple. The patient was then sent to the operating room where the sentinel node(s) were identified and removed by the surgeon. RADIOPHARMACEUTICALS:  Total of 1 mCi Millipore-filtered  Technetium-70msulfur colloid, injected in four aliquots of 0.25 mCi each. IMPRESSION: Uncomplicated intradermal injection of a total of 1 mCi Technetium-943mulfur colloid for purposes of sentinel node identification. Electronically Signed   By: GlAletta Edouard.D.   On: 03/25/2017 10:53   Mm Breast Surgical Specimen  Result Date: 03/25/2017 CLINICAL DATA:  Status post right breast lumpectomy today after earlier needle localization. EXAM: SPECIMEN RADIOGRAPH OF THE RIGHT BREAST COMPARISON:  Previous exam(s). FINDINGS: Status post excision of the right breast. The wire tip and biopsy marker clip are present and are marked for pathology. IMPRESSION: Specimen radiograph of the right breast. Electronically Signed   By: StFranki Cabot  M.D.   On: 03/25/2017 12:51   Mm Rt Plc Breast Loc Dev   1st Lesion  Inc Mammo Guide  Result Date: 03/25/2017 CLINICAL DATA:  Patient with right breast cancer scheduled for breast conservation surgery today requiring preoperative needle localization. EXAM: NEEDLE LOCALIZATION OF THE RIGHT BREAST WITH MAMMO GUIDANCE COMPARISON:  Previous exams. FINDINGS: Patient presents for needle localization prior to breast conservation surgery. I met with the patient and we discussed the procedure of needle localization including benefits and alternatives. We discussed the high likelihood of a successful procedure. We discussed the risks of the procedure, including infection, bleeding, tissue injury, and further surgery. Informed, written consent was given. The usual time-out protocol was performed immediately prior to the procedure. Using mammographic guidance, sterile technique, 1% lidocaine and a 9 cm modified Kopans needle, the X shaped clip within the upper-outer quadrant of the right breast was localized using lateral approach. The images were marked for Dr. Windell Moment. IMPRESSION: Needle localization right breast. No apparent complications. Electronically Signed   By: Franki Cabot M.D.    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  PLAN:    1. 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  Patient was evaluated by surgery earlier today and has a lumpectomy scheduled for March 25, 2017.  Given the stage and size of patient's tumor, it is unlikely she will require chemotherapy but will send pathology for Oncotype DX to determine whether this is a high or low risk malignancy.  Given the fact the patient is scheduled for lumpectomy, she will benefit from adjuvant XRT and a referral has been made to radiation oncology.  Finally, because of her ER/PR status, she will benefit from an aromatase inhibitor for 5 years at the completion of all her treatments.  Patient will return to clinic in mid February for further evaluation, discussion of her final pathology results, and additional treatment planning.  Approximately 60 minutes was spent in discussion of which greater than 50% was consultation.  Patient expressed understanding and was in agreement with this plan. She also understands that She can call clinic at any time with any questions, concerns, or complaints.   Cancer Staging Primary cancer of upper outer quadrant of right female breast [facility]) Staging form: Breast, AJCC 8th Edition - Clinical stage from 03/14/2017: Stage IA (cT1b, cN0, cM0, G2, ER: Positive, PR: Positive, HER2: Negative) - Signed by Lloyd Huger, MD on 03/15/2017   Lloyd Huger, MD   04/07/2017 10:26 AM

## 2017-04-08 ENCOUNTER — Ambulatory Visit: Payer: Medicare HMO | Admitting: Oncology

## 2017-04-09 ENCOUNTER — Ambulatory Visit
Admission: RE | Admit: 2017-04-09 | Discharge: 2017-04-09 | Disposition: A | Discharge status: Home / Self Care | Payer: Medicare HMO | Source: Ambulatory Visit | Attending: Radiation Oncology | Admitting: Radiation Oncology

## 2017-04-09 ENCOUNTER — Inpatient Hospital Stay: Payer: Medicare HMO | Admitting: Oncology

## 2017-04-09 ENCOUNTER — Encounter: Payer: Self-pay | Admitting: Radiation Oncology

## 2017-04-09 ENCOUNTER — Other Ambulatory Visit: Payer: Self-pay

## 2017-04-09 VITALS — BP 117/74 | HR 72 | Temp 98.1°F | Resp 18 | Ht 62.0 in | Wt 210.1 lb

## 2017-04-09 DIAGNOSIS — I252 Old myocardial infarction: Secondary | ICD-10-CM | POA: Insufficient documentation

## 2017-04-09 DIAGNOSIS — I2510 Atherosclerotic heart disease of native coronary artery without angina pectoris: Secondary | ICD-10-CM | POA: Diagnosis not present

## 2017-04-09 DIAGNOSIS — Z79899 Other long term (current) drug therapy: Secondary | ICD-10-CM | POA: Diagnosis not present

## 2017-04-09 DIAGNOSIS — K760 Fatty (change of) liver, not elsewhere classified: Secondary | ICD-10-CM | POA: Insufficient documentation

## 2017-04-09 DIAGNOSIS — Z90710 Acquired absence of both cervix and uterus: Secondary | ICD-10-CM | POA: Insufficient documentation

## 2017-04-09 DIAGNOSIS — Z7982 Long term (current) use of aspirin: Secondary | ICD-10-CM | POA: Insufficient documentation

## 2017-04-09 DIAGNOSIS — C50411 Malignant neoplasm of upper-outer quadrant of right female breast: Secondary | ICD-10-CM | POA: Diagnosis not present

## 2017-04-09 DIAGNOSIS — Z803 Family history of malignant neoplasm of breast: Secondary | ICD-10-CM | POA: Diagnosis not present

## 2017-04-09 DIAGNOSIS — F419 Anxiety disorder, unspecified: Secondary | ICD-10-CM | POA: Insufficient documentation

## 2017-04-09 DIAGNOSIS — Z170 Estrogen receptor positive status [ER+]: Secondary | ICD-10-CM | POA: Insufficient documentation

## 2017-04-09 DIAGNOSIS — R0602 Shortness of breath: Secondary | ICD-10-CM | POA: Insufficient documentation

## 2017-04-09 DIAGNOSIS — Z510 Encounter for antineoplastic radiation therapy: Secondary | ICD-10-CM | POA: Diagnosis not present

## 2017-04-09 DIAGNOSIS — E7800 Pure hypercholesterolemia, unspecified: Secondary | ICD-10-CM | POA: Diagnosis not present

## 2017-04-09 DIAGNOSIS — I10 Essential (primary) hypertension: Secondary | ICD-10-CM | POA: Insufficient documentation

## 2017-04-09 NOTE — Consult Note (Signed)
NEW PATIENT EVALUATION  Name: Linda Yang  MRN: [id number]  Date:   04/09/2017     DOB: 1941-02-09   This 77 y.o. 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  REFERRING PHYSICIAN: Dion Body, MD  CHIEF COMPLAINT:  Chief Complaint  Patient presents with  . Breast Cancer    Pt is here for initial consultation of breast cancer.      DIAGNOSIS: The encounter diagnosis was Primary cancer of upper outer quadrant of right female breast ([place]).   PREVIOUS INVESTIGATIONS:  Mammogram ultrasound reviewed Pathology report reviewed Clinical notes reviewed  HPI: Patient is a 77 year old female who presented with an abnormal mammogram of her right breast showing a new irregular mass within the upper outer quadrant of the right breast measuring proximal be 7 mm. Was not a specific sonographic correlate although stereotactic biopsy was recommended. This was positive for invasive mammary carcinoma ER/PR positive HER-2/neu not overexpressed. She went on to have a wide local excision and sentinel node biopsy. Tumor was overall grade 26 mm invasive mammary carcinoma with 3 sentinel lymph nodes negative for metastatic disease. Margins were negative with the closest margin being 2 mm at the deep margin. She's been seen by medical oncology and based on the small tumor size not thought to be candidate for systemic chemotherapy. She is seen today and is somewhat anxious although doing well she specifically denies breast tenderness cough or bone pain.   PLANNED TREATMENT REGIMEN: Right whole breast radiation  PAST MEDICAL HISTORY:  has a past medical history of Anginal pain ([place]), Anxiety, Complication of anesthesia, Coronary artery disease, Dyspnea, Fatty liver, High cholesterol, Hypertension, Myocardial infarction ([place]), and PONV (postoperative nausea and vomiting).     PAST SURGICAL HISTORY:  Past Surgical History:  Procedure Laterality Date  . ABDOMINAL HYSTERECTOMY    . ACHILLES TENDON SURGERY    . BREAST BIOPSY Right 03/05/2017   Affirm Bx- invasive mammary  . BREAST LUMPECTOMY Right 03/25/2017   invasive mammary  . BUNIONECTOMY Bilateral   . CATARACT EXTRACTION W/PHACO Left 01/29/2017   Procedure: CATARACT EXTRACTION PHACO AND INTRAOCULAR LENS PLACEMENT (IOC);  Surgeon: Birder Robson, MD;  Location: [facility];  Service: Ophthalmology;  Laterality: Left;  [place] 00:49.9 AP% 20.3 CDE 10.11 Fluid Pack lot # [id number]  . CORONARY ANGIOPLASTY     STENT 5/18  . CORONARY STENT INTERVENTION N/A 07/18/2016   Procedure: Coronary Stent Intervention;  Surgeon: Yolonda Kida, MD;  Location: [place] [facility];  Service: Cardiovascular;  Laterality: N/A;  . CTR    . INTRAVASCULAR PRESSURE WIRE/FFR STUDY N/A 07/18/2016   Procedure: Intravascular Pressure Wire/FFR Study;  Surgeon: Yolonda Kida, MD;  Location: [place] [facility] [facility];  Service: Cardiovascular;  Laterality: N/A;  . LEFT HEART CATH AND CORONARY ANGIOGRAPHY N/A 07/18/2016   Procedure: Left Heart Cath and Coronary Angiography;  Surgeon: Yolonda Kida, MD;  Location: [place];  Service: Cardiovascular;  Laterality: N/A;  . PARTIAL MASTECTOMY WITH NEEDLE LOCALIZATION Right 03/25/2017   Procedure: PARTIAL MASTECTOMY WITH NEEDLE LOCALIZATION;  Surgeon: Herbert Pun, MD;  Location: [facility];  Service: General;  Laterality: Right;  . SENTINEL NODE BIOPSY Right 03/25/2017   Procedure: SENTINEL NODE BIOPSY;  Surgeon: Herbert Pun, MD;  Location: [facility];  Service: General;  Laterality: Right;  . TRIGGER FINGER RELEASE Bilateral     FAMILY  HISTORY: family history includes Breast cancer in her paternal aunt.  SOCIAL HISTORY:  reports that  has never smoked. she has never used smokeless tobacco. She reports that she does not drink alcohol or use drugs.  ALLERGIES:  Metoprolol; Other; Red yeast rice [cholestin]; and Statins  MEDICATIONS:  Current Outpatient Medications  Medication Sig Dispense Refill  . aspirin EC 81 MG tablet Take 81 mg by mouth daily.     Marland Kitchen atorvastatin (LIPITOR) 40 MG tablet Take 40 mg by mouth every evening.     . Calcium Carbonate-Simethicone (ALKA-SELTZER HEARTBURN + GAS) 750-80 MG CHEW Chew 1 tablet by mouth daily as needed (heartburn).    . Camphor-Menthol-Capsicum (TIGER BALM PAIN RELIEVING) 80-24-16 MG PTCH Place 1-3 patches onto the skin daily as needed (pain).     Marland Kitchen clopidogrel (PLAVIX) 75 MG tablet Take 75 mg by mouth daily.    Marland Kitchen docusate sodium (COLACE) 100 MG capsule Take 100 mg by mouth daily as needed (for constipation).     . ferrous sulfate 325 (65 FE) MG tablet Take 325 mg by mouth at bedtime.    . fexofenadine (ALLEGRA) 180 MG tablet Take 180 mg by mouth at bedtime as needed for allergies or rhinitis.    Marland Kitchen ibuprofen (ADVIL,MOTRIN) 800 MG tablet Take 800 mg by mouth every 8 (eight) hours as needed.    . isosorbide mononitrate (IMDUR) 30 MG 24 hr tablet Take 30 mg by mouth daily.    . metoprolol succinate (TOPROL-XL) 25 MG 24 hr tablet Take 12.5-25 mg by mouth 2 (two) times daily. TAKE 0.5 TABLET (12.5 MG) BY MOUTH EVERY MORNING & TAKE 1 TABLET (25 MG) BY MOUTH IN THE EVENING    . nitroGLYCERIN (NITROSTAT) 0.4 MG SL tablet Place 0.4 mg under the tongue every 5 (five) minutes as needed for chest pain.     Marland Kitchen PARoxetine (PAXIL) 20 MG tablet Take 20 mg by mouth at bedtime.    . Polyvinyl Alcohol-Povidone PF (REFRESH) 1.4-0.6 % SOLN Place 1-2 drops into both eyes 3 (three) times daily as needed (for dry eyes.).    Marland Kitchen trolamine salicylate (ASPERCREME) 10 % cream Apply 1 application topically 4 (four) times daily as needed for muscle pain.     . vitamin B-12 (CYANOCOBALAMIN) 1000 MCG tablet Take 1,000 mcg by mouth at bedtime.    . cholecalciferol (VITAMIN D) 400 units TABS tablet Take 400 Units by mouth at bedtime.    Marland Kitchen  estradiol (ESTRACE) 0.1 MG/GM vaginal cream Place 1 Applicatorful vaginally daily as needed.    Marland Kitchen HYDROcodone-acetaminophen (NORCO/VICODIN) 5-325 MG tablet Take 1 tablet by mouth daily as needed for moderate pain.    . mirabegron ER (MYRBETRIQ) 50 MG TB24 tablet Take 50 mg by mouth daily as needed (for overactive bladder (typically once a week)).      No current facility-administered medications for this encounter.     ECOG PERFORMANCE STATUS:  0 - Asymptomatic  REVIEW OF SYSTEMS:  Patient denies any weight loss, fatigue, weakness, fever, chills or night sweats. Patient denies any loss of vision, blurred vision. Patient denies any ringing  of the ears or hearing loss. No irregular heartbeat. Patient denies heart murmur or history of fainting. Patient denies any chest pain or pain radiating to her upper extremities. Patient denies any shortness of breath, difficulty breathing at night, cough or hemoptysis. Patient denies any swelling in the lower legs. Patient denies any nausea vomiting, vomiting of blood, or coffee ground material in the vomitus. Patient  denies any stomach pain. Patient states has had normal bowel movements no significant constipation or diarrhea. Patient denies any dysuria, hematuria or significant nocturia. Patient denies any problems walking, swelling in the joints or loss of balance. Patient denies any skin changes, loss of hair or loss of weight. Patient denies any excessive worrying or anxiety or significant depression. Patient denies any problems with insomnia. Patient denies excessive thirst, polyuria, polydipsia. Patient denies any swollen glands, patient denies easy bruising or easy bleeding. Patient denies any recent infections, allergies or URI. Patient "s visual fields have not changed significantly in recent time.    PHYSICAL EXAM: BP 117/74   Pulse 72   Temp 98.1 F (36.7 C)   Resp 18   Ht 5' 2"  (1.575 m)   Wt 210 lb 1.6 oz (95.3 kg)   BMI 38.43 kg/m  Right  breast is wide local excision and sentinel node biopsy site scars which are healed well. No dominant mass or nodularity is noted in either breast in 2 positions examined. No axillary or supraclavicular adenopathy is identified. Well-developed well-nourished patient in NAD. HEENT reveals PERLA, EOMI, discs not visualized.  Oral cavity is clear. No oral mucosal lesions are identified. Neck is clear without evidence of cervical or supraclavicular adenopathy. Lungs are clear to A&P. Cardiac examination is essentially unremarkable with regular rate and rhythm without murmur rub or thrill. Abdomen is benign with no organomegaly or masses noted. Motor sensory and DTR levels are equal and symmetric in the upper and lower extremities. Cranial nerves II through XII are grossly intact. Proprioception is intact. No peripheral adenopathy or edema is identified. No motor or sensory levels are noted. Crude visual fields are within normal range.  LABORATORY DATA: Pathology reports reviewed    RADIOLOGY RESULTS: Mammogram and ultrasound reviewed   IMPRESSION: Stage I invasive mammary carcinoma the right breast status post wide local excision ER/PR positive  PLAN: At this time based the patient's extremely large breast size hypofractionated course of treatment is not indicated. I would plan on delivering 5040 cGy in 28 fractions boosting her scar another 1600 cGy using electron beam based on the close 2 mm margin. Risks and benefits of treatment including skin reaction which I anticipate be somewhat more pronounced based on the large breast size, alteration of blood counts possible inclusion of superficial lung fatigue all were discussed in detail with the patient. She seems to comprehend my treatment plan well. I personally set up and ordered CT simulation for later this week. Patient also will be candidate for antiestrogen therapy after completion of radiation.  I would like to take this opportunity to thank you for  allowing me to participate in the care of your patient.Noreene Filbert, MD

## 2017-04-09 NOTE — Progress Notes (Signed)
  Oncology Nurse Navigator Documentation  Navigator Location: [facility] (04/09/17 1300)   )Navigator Encounter Type: Initial RadOnc (04/09/17 1300)                     Patient Visit Type: CBIPJR (04/09/17 1300) Treatment Phase: Pre-Tx/Tx Discussion (04/09/17 1300) Barriers/Navigation Needs: Coordination of Care;Education (04/09/17 1300) Education: Coping with Diagnosis/ Prognosis;Preparing for Upcoming Surgery/ Treatment (04/09/17 1300) Interventions: Referrals;Coordination of Care (04/09/17 1300) Referrals: Social Work(counseling) (04/09/17 1300)                    Time Spent with Patient: 60 (04/09/17 1300)  Met with patient, family at initial Radiation Oncology consult.  Dr. Baruch Gouty  thoroughly explained treatment plan.  Will scheduled return visit with Dr. Grayland Ormond at completion of radiation.  Patient expresses she is now feeling the impact of her diagnosis, and also some personal, and medical events of the past year.  Recommended she contact counselor Nathanial Millman assist with these concerns.  She is scheduled for simulation on 04/11/17.

## 2017-04-11 ENCOUNTER — Ambulatory Visit
Admission: RE | Admit: 2017-04-11 | Discharge: 2017-04-11 | Disposition: A | Discharge status: Home / Self Care | Payer: Medicare HMO | Source: Ambulatory Visit | Attending: Radiation Oncology | Admitting: Radiation Oncology

## 2017-04-11 DIAGNOSIS — C50411 Malignant neoplasm of upper-outer quadrant of right female breast: Secondary | ICD-10-CM | POA: Diagnosis not present

## 2017-04-11 DIAGNOSIS — Z510 Encounter for antineoplastic radiation therapy: Secondary | ICD-10-CM | POA: Diagnosis not present

## 2017-04-11 DIAGNOSIS — F419 Anxiety disorder, unspecified: Secondary | ICD-10-CM | POA: Diagnosis not present

## 2017-04-11 DIAGNOSIS — I10 Essential (primary) hypertension: Secondary | ICD-10-CM | POA: Diagnosis not present

## 2017-04-11 DIAGNOSIS — R0602 Shortness of breath: Secondary | ICD-10-CM | POA: Diagnosis not present

## 2017-04-11 DIAGNOSIS — E7800 Pure hypercholesterolemia, unspecified: Secondary | ICD-10-CM | POA: Diagnosis not present

## 2017-04-11 DIAGNOSIS — K760 Fatty (change of) liver, not elsewhere classified: Secondary | ICD-10-CM | POA: Diagnosis not present

## 2017-04-11 DIAGNOSIS — Z170 Estrogen receptor positive status [ER+]: Secondary | ICD-10-CM | POA: Diagnosis not present

## 2017-04-11 DIAGNOSIS — I2510 Atherosclerotic heart disease of native coronary artery without angina pectoris: Secondary | ICD-10-CM | POA: Diagnosis not present

## 2017-04-12 ENCOUNTER — Other Ambulatory Visit: Payer: Self-pay | Admitting: *Deleted

## 2017-04-12 DIAGNOSIS — C50411 Malignant neoplasm of upper-outer quadrant of right female breast: Secondary | ICD-10-CM

## 2017-04-15 DIAGNOSIS — F419 Anxiety disorder, unspecified: Secondary | ICD-10-CM | POA: Diagnosis not present

## 2017-04-15 DIAGNOSIS — I2510 Atherosclerotic heart disease of native coronary artery without angina pectoris: Secondary | ICD-10-CM | POA: Diagnosis not present

## 2017-04-15 DIAGNOSIS — R0602 Shortness of breath: Secondary | ICD-10-CM | POA: Diagnosis not present

## 2017-04-15 DIAGNOSIS — E7800 Pure hypercholesterolemia, unspecified: Secondary | ICD-10-CM | POA: Diagnosis not present

## 2017-04-15 DIAGNOSIS — Z170 Estrogen receptor positive status [ER+]: Secondary | ICD-10-CM | POA: Diagnosis not present

## 2017-04-15 DIAGNOSIS — C50411 Malignant neoplasm of upper-outer quadrant of right female breast: Secondary | ICD-10-CM | POA: Diagnosis not present

## 2017-04-15 DIAGNOSIS — Z510 Encounter for antineoplastic radiation therapy: Secondary | ICD-10-CM | POA: Diagnosis not present

## 2017-04-15 DIAGNOSIS — K760 Fatty (change of) liver, not elsewhere classified: Secondary | ICD-10-CM | POA: Diagnosis not present

## 2017-04-15 DIAGNOSIS — I10 Essential (primary) hypertension: Secondary | ICD-10-CM | POA: Diagnosis not present

## 2017-04-18 ENCOUNTER — Ambulatory Visit
Admission: RE | Admit: 2017-04-18 | Discharge: 2017-04-18 | Disposition: A | Discharge status: Home / Self Care | Payer: Medicare HMO | Source: Ambulatory Visit | Attending: Radiation Oncology | Admitting: Radiation Oncology

## 2017-04-18 DIAGNOSIS — Z510 Encounter for antineoplastic radiation therapy: Secondary | ICD-10-CM | POA: Diagnosis not present

## 2017-04-18 DIAGNOSIS — E7800 Pure hypercholesterolemia, unspecified: Secondary | ICD-10-CM | POA: Diagnosis not present

## 2017-04-18 DIAGNOSIS — I10 Essential (primary) hypertension: Secondary | ICD-10-CM | POA: Diagnosis not present

## 2017-04-18 DIAGNOSIS — C50411 Malignant neoplasm of upper-outer quadrant of right female breast: Secondary | ICD-10-CM | POA: Diagnosis not present

## 2017-04-18 DIAGNOSIS — K760 Fatty (change of) liver, not elsewhere classified: Secondary | ICD-10-CM | POA: Diagnosis not present

## 2017-04-18 DIAGNOSIS — F419 Anxiety disorder, unspecified: Secondary | ICD-10-CM | POA: Diagnosis not present

## 2017-04-18 DIAGNOSIS — I2510 Atherosclerotic heart disease of native coronary artery without angina pectoris: Secondary | ICD-10-CM | POA: Diagnosis not present

## 2017-04-18 DIAGNOSIS — Z170 Estrogen receptor positive status [ER+]: Secondary | ICD-10-CM | POA: Diagnosis not present

## 2017-04-18 DIAGNOSIS — R0602 Shortness of breath: Secondary | ICD-10-CM | POA: Diagnosis not present

## 2017-04-22 ENCOUNTER — Ambulatory Visit
Admission: RE | Admit: 2017-04-22 | Discharge: 2017-04-22 | Disposition: A | Discharge status: Home / Self Care | Payer: Medicare HMO | Source: Ambulatory Visit | Attending: Radiation Oncology | Admitting: Radiation Oncology

## 2017-04-22 DIAGNOSIS — Z510 Encounter for antineoplastic radiation therapy: Secondary | ICD-10-CM | POA: Diagnosis not present

## 2017-04-22 DIAGNOSIS — C50411 Malignant neoplasm of upper-outer quadrant of right female breast: Secondary | ICD-10-CM | POA: Diagnosis not present

## 2017-04-22 DIAGNOSIS — Z170 Estrogen receptor positive status [ER+]: Secondary | ICD-10-CM | POA: Insufficient documentation

## 2017-04-23 ENCOUNTER — Ambulatory Visit
Admission: RE | Admit: 2017-04-23 | Discharge: 2017-04-23 | Disposition: A | Discharge status: Home / Self Care | Payer: Medicare HMO | Source: Ambulatory Visit | Attending: Radiation Oncology | Admitting: Radiation Oncology

## 2017-04-23 DIAGNOSIS — Z510 Encounter for antineoplastic radiation therapy: Secondary | ICD-10-CM | POA: Diagnosis not present

## 2017-04-23 DIAGNOSIS — Z170 Estrogen receptor positive status [ER+]: Secondary | ICD-10-CM | POA: Diagnosis not present

## 2017-04-23 DIAGNOSIS — C50411 Malignant neoplasm of upper-outer quadrant of right female breast: Secondary | ICD-10-CM | POA: Diagnosis not present

## 2017-04-24 ENCOUNTER — Ambulatory Visit
Admission: RE | Admit: 2017-04-24 | Discharge: 2017-04-24 | Disposition: A | Discharge status: Home / Self Care | Payer: Medicare HMO | Source: Ambulatory Visit | Attending: Radiation Oncology | Admitting: Radiation Oncology

## 2017-04-24 ENCOUNTER — Encounter: Payer: Self-pay | Admitting: Oncology

## 2017-04-24 DIAGNOSIS — C50411 Malignant neoplasm of upper-outer quadrant of right female breast: Secondary | ICD-10-CM | POA: Diagnosis not present

## 2017-04-24 DIAGNOSIS — Z510 Encounter for antineoplastic radiation therapy: Secondary | ICD-10-CM | POA: Diagnosis not present

## 2017-04-24 DIAGNOSIS — Z170 Estrogen receptor positive status [ER+]: Secondary | ICD-10-CM | POA: Diagnosis not present

## 2017-04-25 ENCOUNTER — Ambulatory Visit
Admission: RE | Admit: 2017-04-25 | Discharge: 2017-04-25 | Disposition: A | Discharge status: Home / Self Care | Payer: Medicare HMO | Source: Ambulatory Visit | Attending: Radiation Oncology | Admitting: Radiation Oncology

## 2017-04-25 DIAGNOSIS — C50411 Malignant neoplasm of upper-outer quadrant of right female breast: Secondary | ICD-10-CM | POA: Diagnosis not present

## 2017-04-25 DIAGNOSIS — Z510 Encounter for antineoplastic radiation therapy: Secondary | ICD-10-CM | POA: Diagnosis not present

## 2017-04-25 DIAGNOSIS — Z170 Estrogen receptor positive status [ER+]: Secondary | ICD-10-CM | POA: Diagnosis not present

## 2017-04-26 ENCOUNTER — Ambulatory Visit
Admission: RE | Admit: 2017-04-26 | Discharge: 2017-04-26 | Disposition: A | Discharge status: Home / Self Care | Payer: Medicare HMO | Source: Ambulatory Visit | Attending: Radiation Oncology | Admitting: Radiation Oncology

## 2017-04-26 DIAGNOSIS — C50411 Malignant neoplasm of upper-outer quadrant of right female breast: Secondary | ICD-10-CM | POA: Diagnosis not present

## 2017-04-26 DIAGNOSIS — Z170 Estrogen receptor positive status [ER+]: Secondary | ICD-10-CM | POA: Diagnosis not present

## 2017-04-26 DIAGNOSIS — Z510 Encounter for antineoplastic radiation therapy: Secondary | ICD-10-CM | POA: Diagnosis not present

## 2017-04-29 ENCOUNTER — Ambulatory Visit
Admission: RE | Admit: 2017-04-29 | Discharge: 2017-04-29 | Disposition: A | Discharge status: Home / Self Care | Payer: Medicare HMO | Source: Ambulatory Visit | Attending: Radiation Oncology | Admitting: Radiation Oncology

## 2017-04-29 DIAGNOSIS — C50411 Malignant neoplasm of upper-outer quadrant of right female breast: Secondary | ICD-10-CM | POA: Diagnosis not present

## 2017-04-29 DIAGNOSIS — Z510 Encounter for antineoplastic radiation therapy: Secondary | ICD-10-CM | POA: Diagnosis not present

## 2017-04-29 DIAGNOSIS — M25552 Pain in left hip: Secondary | ICD-10-CM | POA: Diagnosis not present

## 2017-04-29 DIAGNOSIS — M5416 Radiculopathy, lumbar region: Secondary | ICD-10-CM | POA: Diagnosis not present

## 2017-04-29 DIAGNOSIS — Z170 Estrogen receptor positive status [ER+]: Secondary | ICD-10-CM | POA: Diagnosis not present

## 2017-04-29 DIAGNOSIS — M1612 Unilateral primary osteoarthritis, left hip: Secondary | ICD-10-CM | POA: Diagnosis not present

## 2017-04-29 DIAGNOSIS — M5126 Other intervertebral disc displacement, lumbar region: Secondary | ICD-10-CM | POA: Diagnosis not present

## 2017-04-29 DIAGNOSIS — M170 Bilateral primary osteoarthritis of knee: Secondary | ICD-10-CM | POA: Diagnosis not present

## 2017-04-30 ENCOUNTER — Ambulatory Visit
Admission: RE | Admit: 2017-04-30 | Discharge: 2017-04-30 | Disposition: A | Discharge status: Home / Self Care | Payer: Medicare HMO | Source: Ambulatory Visit | Attending: Radiation Oncology | Admitting: Radiation Oncology

## 2017-04-30 DIAGNOSIS — Z510 Encounter for antineoplastic radiation therapy: Secondary | ICD-10-CM | POA: Diagnosis not present

## 2017-04-30 DIAGNOSIS — C50411 Malignant neoplasm of upper-outer quadrant of right female breast: Secondary | ICD-10-CM | POA: Diagnosis not present

## 2017-04-30 DIAGNOSIS — Z170 Estrogen receptor positive status [ER+]: Secondary | ICD-10-CM | POA: Diagnosis not present

## 2017-05-01 ENCOUNTER — Ambulatory Visit
Admission: RE | Admit: 2017-05-01 | Discharge: 2017-05-01 | Disposition: A | Discharge status: Home / Self Care | Payer: Medicare HMO | Source: Ambulatory Visit | Attending: Radiation Oncology | Admitting: Radiation Oncology

## 2017-05-01 DIAGNOSIS — C50411 Malignant neoplasm of upper-outer quadrant of right female breast: Secondary | ICD-10-CM | POA: Diagnosis not present

## 2017-05-01 DIAGNOSIS — Z170 Estrogen receptor positive status [ER+]: Secondary | ICD-10-CM | POA: Diagnosis not present

## 2017-05-01 DIAGNOSIS — Z510 Encounter for antineoplastic radiation therapy: Secondary | ICD-10-CM | POA: Diagnosis not present

## 2017-05-02 ENCOUNTER — Ambulatory Visit
Admission: RE | Admit: 2017-05-02 | Discharge: 2017-05-02 | Disposition: A | Discharge status: Home / Self Care | Payer: Medicare HMO | Source: Ambulatory Visit | Attending: Radiation Oncology | Admitting: Radiation Oncology

## 2017-05-02 DIAGNOSIS — Z170 Estrogen receptor positive status [ER+]: Secondary | ICD-10-CM | POA: Diagnosis not present

## 2017-05-02 DIAGNOSIS — C50411 Malignant neoplasm of upper-outer quadrant of right female breast: Secondary | ICD-10-CM | POA: Diagnosis not present

## 2017-05-02 DIAGNOSIS — Z510 Encounter for antineoplastic radiation therapy: Secondary | ICD-10-CM | POA: Diagnosis not present

## 2017-05-03 ENCOUNTER — Ambulatory Visit
Admission: RE | Admit: 2017-05-03 | Discharge: 2017-05-03 | Disposition: A | Discharge status: Home / Self Care | Payer: Medicare HMO | Source: Ambulatory Visit | Attending: Radiation Oncology | Admitting: Radiation Oncology

## 2017-05-03 DIAGNOSIS — C50411 Malignant neoplasm of upper-outer quadrant of right female breast: Secondary | ICD-10-CM | POA: Diagnosis not present

## 2017-05-03 DIAGNOSIS — Z510 Encounter for antineoplastic radiation therapy: Secondary | ICD-10-CM | POA: Diagnosis not present

## 2017-05-03 DIAGNOSIS — Z170 Estrogen receptor positive status [ER+]: Secondary | ICD-10-CM | POA: Diagnosis not present

## 2017-05-06 ENCOUNTER — Ambulatory Visit
Admission: RE | Admit: 2017-05-06 | Discharge: 2017-05-06 | Disposition: A | Discharge status: Home / Self Care | Payer: Medicare HMO | Source: Ambulatory Visit | Attending: Radiation Oncology | Admitting: Radiation Oncology

## 2017-05-06 ENCOUNTER — Inpatient Hospital Stay: Payer: Medicare HMO | Attending: Radiation Oncology

## 2017-05-06 DIAGNOSIS — I10 Essential (primary) hypertension: Secondary | ICD-10-CM | POA: Diagnosis not present

## 2017-05-06 DIAGNOSIS — I200 Unstable angina: Secondary | ICD-10-CM | POA: Diagnosis not present

## 2017-05-06 DIAGNOSIS — I208 Other forms of angina pectoris: Secondary | ICD-10-CM | POA: Diagnosis not present

## 2017-05-06 DIAGNOSIS — Z170 Estrogen receptor positive status [ER+]: Secondary | ICD-10-CM | POA: Diagnosis not present

## 2017-05-06 DIAGNOSIS — C50411 Malignant neoplasm of upper-outer quadrant of right female breast: Secondary | ICD-10-CM | POA: Insufficient documentation

## 2017-05-06 DIAGNOSIS — R0602 Shortness of breath: Secondary | ICD-10-CM | POA: Diagnosis not present

## 2017-05-06 DIAGNOSIS — Z8659 Personal history of other mental and behavioral disorders: Secondary | ICD-10-CM | POA: Diagnosis not present

## 2017-05-06 DIAGNOSIS — I2510 Atherosclerotic heart disease of native coronary artery without angina pectoris: Secondary | ICD-10-CM | POA: Diagnosis not present

## 2017-05-06 DIAGNOSIS — R002 Palpitations: Secondary | ICD-10-CM | POA: Diagnosis not present

## 2017-05-06 DIAGNOSIS — Z510 Encounter for antineoplastic radiation therapy: Secondary | ICD-10-CM | POA: Diagnosis not present

## 2017-05-06 DIAGNOSIS — I9789 Other postprocedural complications and disorders of the circulatory system, not elsewhere classified: Secondary | ICD-10-CM | POA: Diagnosis not present

## 2017-05-06 DIAGNOSIS — E669 Obesity, unspecified: Secondary | ICD-10-CM | POA: Diagnosis not present

## 2017-05-06 LAB — CBC
HCT: 44.2 % (ref 35.0–47.0)
Hemoglobin: 14.8 g/dL (ref 12.0–16.0)
MCH: 32.4 pg (ref 26.0–34.0)
MCHC: 33.5 g/dL (ref 32.0–36.0)
MCV: 96.7 fL (ref 80.0–100.0)
PLATELETS: 317 10*3/uL (ref 150–440)
RBC: 4.57 MIL/uL (ref 3.80–5.20)
RDW: 13.3 % (ref 11.5–14.5)
WBC: 12.3 10*3/uL — ABNORMAL HIGH (ref 3.6–11.0)

## 2017-05-07 ENCOUNTER — Ambulatory Visit
Admission: RE | Admit: 2017-05-07 | Discharge: 2017-05-07 | Disposition: A | Discharge status: Home / Self Care | Payer: Medicare HMO | Source: Ambulatory Visit | Attending: Radiation Oncology | Admitting: Radiation Oncology

## 2017-05-07 DIAGNOSIS — C50411 Malignant neoplasm of upper-outer quadrant of right female breast: Secondary | ICD-10-CM | POA: Diagnosis not present

## 2017-05-07 DIAGNOSIS — Z510 Encounter for antineoplastic radiation therapy: Secondary | ICD-10-CM | POA: Diagnosis not present

## 2017-05-07 DIAGNOSIS — Z170 Estrogen receptor positive status [ER+]: Secondary | ICD-10-CM | POA: Diagnosis not present

## 2017-05-08 ENCOUNTER — Encounter: Payer: Self-pay | Admitting: Oncology

## 2017-05-08 ENCOUNTER — Ambulatory Visit
Admission: RE | Admit: 2017-05-08 | Discharge: 2017-05-08 | Disposition: A | Discharge status: Home / Self Care | Payer: Medicare HMO | Source: Ambulatory Visit | Attending: Radiation Oncology | Admitting: Radiation Oncology

## 2017-05-08 DIAGNOSIS — C50411 Malignant neoplasm of upper-outer quadrant of right female breast: Secondary | ICD-10-CM | POA: Diagnosis not present

## 2017-05-08 DIAGNOSIS — Z510 Encounter for antineoplastic radiation therapy: Secondary | ICD-10-CM | POA: Diagnosis not present

## 2017-05-08 DIAGNOSIS — Z170 Estrogen receptor positive status [ER+]: Secondary | ICD-10-CM | POA: Diagnosis not present

## 2017-05-09 ENCOUNTER — Ambulatory Visit
Admission: RE | Admit: 2017-05-09 | Discharge: 2017-05-09 | Disposition: A | Discharge status: Home / Self Care | Payer: Medicare HMO | Source: Ambulatory Visit | Attending: Radiation Oncology | Admitting: Radiation Oncology

## 2017-05-09 DIAGNOSIS — Z170 Estrogen receptor positive status [ER+]: Secondary | ICD-10-CM | POA: Diagnosis not present

## 2017-05-09 DIAGNOSIS — Z510 Encounter for antineoplastic radiation therapy: Secondary | ICD-10-CM | POA: Diagnosis not present

## 2017-05-09 DIAGNOSIS — C50411 Malignant neoplasm of upper-outer quadrant of right female breast: Secondary | ICD-10-CM | POA: Diagnosis not present

## 2017-05-10 ENCOUNTER — Ambulatory Visit
Admission: RE | Admit: 2017-05-10 | Discharge: 2017-05-10 | Disposition: A | Discharge status: Home / Self Care | Payer: Medicare HMO | Source: Ambulatory Visit | Attending: Radiation Oncology | Admitting: Radiation Oncology

## 2017-05-10 ENCOUNTER — Encounter (HOSPITAL_COMMUNITY): Payer: Self-pay

## 2017-05-10 DIAGNOSIS — Z170 Estrogen receptor positive status [ER+]: Secondary | ICD-10-CM | POA: Diagnosis not present

## 2017-05-10 DIAGNOSIS — C50411 Malignant neoplasm of upper-outer quadrant of right female breast: Secondary | ICD-10-CM | POA: Diagnosis not present

## 2017-05-10 DIAGNOSIS — Z510 Encounter for antineoplastic radiation therapy: Secondary | ICD-10-CM | POA: Diagnosis not present

## 2017-05-10 NOTE — Progress Notes (Signed)
Social worker met with patient at the Cancer center and provided counseling services.

## 2017-05-13 ENCOUNTER — Ambulatory Visit
Admission: RE | Admit: 2017-05-13 | Discharge: 2017-05-13 | Disposition: A | Discharge status: Home / Self Care | Payer: Medicare HMO | Source: Ambulatory Visit | Attending: Radiation Oncology | Admitting: Radiation Oncology

## 2017-05-13 DIAGNOSIS — Z170 Estrogen receptor positive status [ER+]: Secondary | ICD-10-CM | POA: Diagnosis not present

## 2017-05-13 DIAGNOSIS — C50411 Malignant neoplasm of upper-outer quadrant of right female breast: Secondary | ICD-10-CM | POA: Diagnosis not present

## 2017-05-13 DIAGNOSIS — Z510 Encounter for antineoplastic radiation therapy: Secondary | ICD-10-CM | POA: Diagnosis not present

## 2017-05-14 ENCOUNTER — Ambulatory Visit
Admission: RE | Admit: 2017-05-14 | Discharge: 2017-05-14 | Disposition: A | Discharge status: Home / Self Care | Payer: Medicare HMO | Source: Ambulatory Visit | Attending: Radiation Oncology | Admitting: Radiation Oncology

## 2017-05-14 DIAGNOSIS — Z510 Encounter for antineoplastic radiation therapy: Secondary | ICD-10-CM | POA: Diagnosis not present

## 2017-05-14 DIAGNOSIS — Z170 Estrogen receptor positive status [ER+]: Secondary | ICD-10-CM | POA: Diagnosis not present

## 2017-05-14 DIAGNOSIS — C50411 Malignant neoplasm of upper-outer quadrant of right female breast: Secondary | ICD-10-CM | POA: Diagnosis not present

## 2017-05-15 ENCOUNTER — Ambulatory Visit
Admission: RE | Admit: 2017-05-15 | Discharge: 2017-05-15 | Disposition: A | Discharge status: Home / Self Care | Payer: Medicare HMO | Source: Ambulatory Visit | Attending: Radiation Oncology | Admitting: Radiation Oncology

## 2017-05-15 DIAGNOSIS — Z510 Encounter for antineoplastic radiation therapy: Secondary | ICD-10-CM | POA: Diagnosis not present

## 2017-05-15 DIAGNOSIS — Z170 Estrogen receptor positive status [ER+]: Secondary | ICD-10-CM | POA: Diagnosis not present

## 2017-05-15 DIAGNOSIS — C50411 Malignant neoplasm of upper-outer quadrant of right female breast: Secondary | ICD-10-CM | POA: Diagnosis not present

## 2017-05-16 ENCOUNTER — Ambulatory Visit
Admission: RE | Admit: 2017-05-16 | Discharge: 2017-05-16 | Disposition: A | Discharge status: Home / Self Care | Payer: Medicare HMO | Source: Ambulatory Visit | Attending: Radiation Oncology | Admitting: Radiation Oncology

## 2017-05-16 DIAGNOSIS — Z510 Encounter for antineoplastic radiation therapy: Secondary | ICD-10-CM | POA: Diagnosis not present

## 2017-05-16 DIAGNOSIS — C50411 Malignant neoplasm of upper-outer quadrant of right female breast: Secondary | ICD-10-CM | POA: Diagnosis not present

## 2017-05-16 DIAGNOSIS — Z170 Estrogen receptor positive status [ER+]: Secondary | ICD-10-CM | POA: Diagnosis not present

## 2017-05-17 ENCOUNTER — Ambulatory Visit
Admission: RE | Admit: 2017-05-17 | Discharge: 2017-05-17 | Disposition: A | Discharge status: Home / Self Care | Payer: Medicare HMO | Source: Ambulatory Visit | Attending: Radiation Oncology | Admitting: Radiation Oncology

## 2017-05-17 ENCOUNTER — Other Ambulatory Visit: Payer: Self-pay | Admitting: *Deleted

## 2017-05-17 DIAGNOSIS — C50411 Malignant neoplasm of upper-outer quadrant of right female breast: Secondary | ICD-10-CM | POA: Diagnosis not present

## 2017-05-17 DIAGNOSIS — Z510 Encounter for antineoplastic radiation therapy: Secondary | ICD-10-CM | POA: Diagnosis not present

## 2017-05-17 DIAGNOSIS — Z170 Estrogen receptor positive status [ER+]: Secondary | ICD-10-CM | POA: Diagnosis not present

## 2017-05-17 MED ORDER — AZITHROMYCIN 250 MG PO TABS: ORAL_TABLET | ORAL | 0 refills | Status: DC

## 2017-05-20 ENCOUNTER — Ambulatory Visit
Admission: RE | Admit: 2017-05-20 | Discharge: 2017-05-20 | Disposition: A | Discharge status: Home / Self Care | Payer: Medicare HMO | Source: Ambulatory Visit | Attending: Radiation Oncology | Admitting: Radiation Oncology

## 2017-05-20 ENCOUNTER — Inpatient Hospital Stay: Payer: Medicare HMO | Attending: Radiation Oncology

## 2017-05-20 DIAGNOSIS — Z923 Personal history of irradiation: Secondary | ICD-10-CM | POA: Diagnosis not present

## 2017-05-20 DIAGNOSIS — R5383 Other fatigue: Secondary | ICD-10-CM | POA: Insufficient documentation

## 2017-05-20 DIAGNOSIS — N390 Urinary tract infection, site not specified: Secondary | ICD-10-CM | POA: Insufficient documentation

## 2017-05-20 DIAGNOSIS — C50411 Malignant neoplasm of upper-outer quadrant of right female breast: Secondary | ICD-10-CM | POA: Insufficient documentation

## 2017-05-20 DIAGNOSIS — Z170 Estrogen receptor positive status [ER+]: Secondary | ICD-10-CM | POA: Diagnosis not present

## 2017-05-20 DIAGNOSIS — R450 Nervousness: Secondary | ICD-10-CM | POA: Insufficient documentation

## 2017-05-20 DIAGNOSIS — R5381 Other malaise: Secondary | ICD-10-CM | POA: Insufficient documentation

## 2017-05-20 DIAGNOSIS — Z510 Encounter for antineoplastic radiation therapy: Secondary | ICD-10-CM | POA: Insufficient documentation

## 2017-05-20 LAB — CBC
HEMATOCRIT: 42.1 % (ref 35.0–47.0)
Hemoglobin: 14.5 g/dL (ref 12.0–16.0)
MCH: 33.2 pg (ref 26.0–34.0)
MCHC: 34.6 g/dL (ref 32.0–36.0)
MCV: 96.1 fL (ref 80.0–100.0)
Platelets: 243 10*3/uL (ref 150–440)
RBC: 4.38 MIL/uL (ref 3.80–5.20)
RDW: 13.3 % (ref 11.5–14.5)
WBC: 6 10*3/uL (ref 3.6–11.0)

## 2017-05-21 ENCOUNTER — Ambulatory Visit
Admission: RE | Admit: 2017-05-21 | Discharge: 2017-05-21 | Disposition: A | Discharge status: Home / Self Care | Payer: Medicare HMO | Source: Ambulatory Visit | Attending: Radiation Oncology | Admitting: Radiation Oncology

## 2017-05-21 DIAGNOSIS — Z170 Estrogen receptor positive status [ER+]: Secondary | ICD-10-CM | POA: Diagnosis not present

## 2017-05-21 DIAGNOSIS — Z510 Encounter for antineoplastic radiation therapy: Secondary | ICD-10-CM | POA: Diagnosis not present

## 2017-05-21 DIAGNOSIS — C50411 Malignant neoplasm of upper-outer quadrant of right female breast: Secondary | ICD-10-CM | POA: Diagnosis not present

## 2017-05-22 ENCOUNTER — Ambulatory Visit
Admission: RE | Admit: 2017-05-22 | Discharge: 2017-05-22 | Disposition: A | Discharge status: Home / Self Care | Payer: Medicare HMO | Source: Ambulatory Visit | Attending: Radiation Oncology | Admitting: Radiation Oncology

## 2017-05-22 DIAGNOSIS — Z510 Encounter for antineoplastic radiation therapy: Secondary | ICD-10-CM | POA: Diagnosis not present

## 2017-05-22 DIAGNOSIS — C50411 Malignant neoplasm of upper-outer quadrant of right female breast: Secondary | ICD-10-CM | POA: Diagnosis not present

## 2017-05-22 DIAGNOSIS — Z170 Estrogen receptor positive status [ER+]: Secondary | ICD-10-CM | POA: Diagnosis not present

## 2017-05-23 ENCOUNTER — Ambulatory Visit
Admission: RE | Admit: 2017-05-23 | Discharge: 2017-05-23 | Disposition: A | Discharge status: Home / Self Care | Payer: Medicare HMO | Source: Ambulatory Visit | Attending: Radiation Oncology | Admitting: Radiation Oncology

## 2017-05-23 DIAGNOSIS — Z510 Encounter for antineoplastic radiation therapy: Secondary | ICD-10-CM | POA: Diagnosis not present

## 2017-05-23 DIAGNOSIS — Z170 Estrogen receptor positive status [ER+]: Secondary | ICD-10-CM | POA: Diagnosis not present

## 2017-05-23 DIAGNOSIS — C50411 Malignant neoplasm of upper-outer quadrant of right female breast: Secondary | ICD-10-CM | POA: Diagnosis not present

## 2017-05-24 ENCOUNTER — Ambulatory Visit: Payer: Medicare HMO

## 2017-05-27 ENCOUNTER — Ambulatory Visit
Admission: RE | Admit: 2017-05-27 | Discharge: 2017-05-27 | Disposition: A | Discharge status: Home / Self Care | Payer: Medicare HMO | Source: Ambulatory Visit | Attending: Radiation Oncology | Admitting: Radiation Oncology

## 2017-05-27 DIAGNOSIS — C50411 Malignant neoplasm of upper-outer quadrant of right female breast: Secondary | ICD-10-CM | POA: Diagnosis not present

## 2017-05-27 DIAGNOSIS — Z510 Encounter for antineoplastic radiation therapy: Secondary | ICD-10-CM | POA: Diagnosis not present

## 2017-05-27 DIAGNOSIS — Z170 Estrogen receptor positive status [ER+]: Secondary | ICD-10-CM | POA: Diagnosis not present

## 2017-05-28 ENCOUNTER — Ambulatory Visit
Admission: RE | Admit: 2017-05-28 | Discharge: 2017-05-28 | Disposition: A | Discharge status: Home / Self Care | Payer: Medicare HMO | Source: Ambulatory Visit | Attending: Radiation Oncology | Admitting: Radiation Oncology

## 2017-05-28 DIAGNOSIS — C50911 Malignant neoplasm of unspecified site of right female breast: Secondary | ICD-10-CM | POA: Diagnosis not present

## 2017-05-28 DIAGNOSIS — R450 Nervousness: Secondary | ICD-10-CM | POA: Diagnosis not present

## 2017-05-28 DIAGNOSIS — N390 Urinary tract infection, site not specified: Secondary | ICD-10-CM | POA: Diagnosis not present

## 2017-05-28 DIAGNOSIS — R0789 Other chest pain: Secondary | ICD-10-CM | POA: Diagnosis not present

## 2017-05-28 DIAGNOSIS — C50411 Malignant neoplasm of upper-outer quadrant of right female breast: Secondary | ICD-10-CM | POA: Diagnosis not present

## 2017-05-28 DIAGNOSIS — Z170 Estrogen receptor positive status [ER+]: Secondary | ICD-10-CM | POA: Diagnosis not present

## 2017-05-28 DIAGNOSIS — Z923 Personal history of irradiation: Secondary | ICD-10-CM | POA: Diagnosis not present

## 2017-05-28 DIAGNOSIS — Z510 Encounter for antineoplastic radiation therapy: Secondary | ICD-10-CM | POA: Diagnosis not present

## 2017-05-28 DIAGNOSIS — R5383 Other fatigue: Secondary | ICD-10-CM | POA: Diagnosis not present

## 2017-05-28 DIAGNOSIS — R5381 Other malaise: Secondary | ICD-10-CM | POA: Diagnosis not present

## 2017-05-29 ENCOUNTER — Inpatient Hospital Stay (HOSPITAL_BASED_OUTPATIENT_CLINIC_OR_DEPARTMENT_OTHER): Payer: Medicare HMO | Admitting: Oncology

## 2017-05-29 ENCOUNTER — Ambulatory Visit
Admission: RE | Admit: 2017-05-29 | Discharge: 2017-05-29 | Disposition: A | Discharge status: Home / Self Care | Payer: Medicare HMO | Source: Ambulatory Visit | Attending: Radiation Oncology | Admitting: Radiation Oncology

## 2017-05-29 ENCOUNTER — Encounter: Payer: Self-pay | Admitting: Oncology

## 2017-05-29 ENCOUNTER — Other Ambulatory Visit: Payer: Self-pay | Admitting: *Deleted

## 2017-05-29 VITALS — BP 113/76 | HR 67 | Temp 98.3°F | Resp 16

## 2017-05-29 DIAGNOSIS — Z170 Estrogen receptor positive status [ER+]: Secondary | ICD-10-CM | POA: Diagnosis not present

## 2017-05-29 DIAGNOSIS — R450 Nervousness: Secondary | ICD-10-CM | POA: Diagnosis not present

## 2017-05-29 DIAGNOSIS — R5381 Other malaise: Secondary | ICD-10-CM | POA: Diagnosis not present

## 2017-05-29 DIAGNOSIS — R5383 Other fatigue: Secondary | ICD-10-CM | POA: Diagnosis not present

## 2017-05-29 DIAGNOSIS — C50411 Malignant neoplasm of upper-outer quadrant of right female breast: Secondary | ICD-10-CM | POA: Diagnosis not present

## 2017-05-29 DIAGNOSIS — N390 Urinary tract infection, site not specified: Secondary | ICD-10-CM

## 2017-05-29 DIAGNOSIS — Z923 Personal history of irradiation: Secondary | ICD-10-CM | POA: Diagnosis not present

## 2017-05-29 DIAGNOSIS — R300 Dysuria: Secondary | ICD-10-CM

## 2017-05-29 DIAGNOSIS — Z510 Encounter for antineoplastic radiation therapy: Secondary | ICD-10-CM | POA: Diagnosis not present

## 2017-05-29 LAB — URINALYSIS, COMPLETE (UACMP) WITH MICROSCOPIC
BILIRUBIN URINE: NEGATIVE
Glucose, UA: NEGATIVE mg/dL
Ketones, ur: NEGATIVE mg/dL
Nitrite: NEGATIVE
PH: 7 (ref 5.0–8.0)
Protein, ur: 30 mg/dL — AB
SPECIFIC GRAVITY, URINE: 1.017 (ref 1.005–1.030)

## 2017-05-29 MED ORDER — SULFAMETHOXAZOLE-TRIMETHOPRIM 800-160 MG PO TABS: 1.0000 | ORAL_TABLET | Freq: Two times a day (BID) | ORAL | 0 refills | Status: DC

## 2017-05-29 MED ORDER — PHENAZOPYRIDINE HCL 200 MG PO TABS: 200.0000 mg | ORAL_TABLET | Freq: Three times a day (TID) | ORAL | 0 refills | Status: DC | PRN

## 2017-05-29 NOTE — Progress Notes (Signed)
Symptom Management Consult note [facility]  Telephone:([phone] [phone]  Patient Care Team: Dion Body, MD as PCP - General (Family Medicine)   Name of the patient: Linda Yang  [id number]  1940/02/21   Date of visit: 05/29/17  Diagnosis- Clinical stage Ia ER/PR positive, HER-2 negative invasive carcinoma of the upper outer quadrant of the right breast.  Chief complaint/ Reason for visit- Dysuria  Heme/Onc history: Patient last seen by primary medical oncologist Dr. Grayland Ormond on 03/14/2017 to discuss new findings of a breast mass of her right breast. She appeared anxious but otherwise was doing well.  Had lumpectomy completed on 03/25/2017 confirming diagnosis. Given the size of the tumor, it was unlikely that she would require systemic chemotherapy but pathology was sent for Oncotype. She was referred to radiation oncology for radiation consultation with Dr. Donella Stade.   Patient saw Dr. Donella Stade on 04/09/2017 for consultation for right whole breast radiation. Her last scheduled radiation is on 06/05/2017.  Interval history-  Patient complains of burning with urination, dysuria, bilateral flank pain, frequency, nausea and urgency   She has had symptoms for 1 day.  Patient also complains of back pain.  Patient denies cough, fever, headache, sorethroat and vaginal discharge.  Patient does not have a history of recurrent UTI.   Patient does not have a history of pyelonephritis.   ECOG FS:0 - Asymptomatic  Review of systems- Review of Systems  Constitutional: Positive for chills and malaise/fatigue. Negative for fever and weight loss.  HENT: Negative for congestion and ear pain.   Eyes: Negative.  Negative for blurred vision and double vision.  Respiratory: Negative.  Negative for cough, sputum production and shortness of breath.   Cardiovascular: Negative.  Negative for chest pain, palpitations and leg swelling.  Gastrointestinal:  Positive for nausea. Negative for abdominal pain, constipation, diarrhea and vomiting.  Genitourinary: Positive for dysuria, flank pain (Intermittent), frequency and urgency.  Musculoskeletal: Positive for back pain. Negative for falls.  Skin: Positive for rash (To right breast ).  Neurological: Negative.  Negative for weakness and headaches.  Endo/Heme/Allergies: Negative.  Does not bruise/bleed easily.  Psychiatric/Behavioral: Negative.  Negative for depression. The patient is not nervous/anxious and does not have insomnia.      Current treatment- Daily right whole breast radiation.   Allergies  Allergen Reactions  . Metoprolol Other (See Comments)    Hypotension   . Other Diarrhea and Other (See Comments)    All mycin antibiotics   . Red Yeast Rice [Cholestin] Other (See Comments)    GI discomfort   . Statins Other (See Comments)    Myalgia, cramps      Past Medical History:  Diagnosis Date  . Anginal pain ([place])    occas. took ntg last week  . Anxiety   . Breast cancer ([place])   . Complication of anesthesia   . Coronary artery disease   . Dyspnea    doe  . Fatty liver   . High cholesterol   . Hypertension   . Myocardial infarction ([place])    5/18  . PONV (postoperative nausea and vomiting)      Past Surgical History:  Procedure Laterality Date  . ABDOMINAL HYSTERECTOMY    . ACHILLES TENDON SURGERY    . BREAST BIOPSY Right 03/05/2017   Affirm Bx- invasive mammary  . BREAST LUMPECTOMY Right 03/25/2017   invasive mammary  . BUNIONECTOMY Bilateral   . CATARACT EXTRACTION W/PHACO Left 01/29/2017   Procedure: CATARACT  EXTRACTION PHACO AND INTRAOCULAR LENS PLACEMENT (IOC);  Surgeon: Birder Robson, MD;  Location: [facility];  Service: Ophthalmology;  Laterality: Left;  [place] 00:49.9 AP% 20.3 CDE 10.11 Fluid Pack lot # [id number]  . CORONARY ANGIOPLASTY     STENT 5/18  . CORONARY STENT INTERVENTION N/A 07/18/2016   Procedure: Coronary Stent Intervention;  Surgeon:  Yolonda Kida, MD;  Location: [place] [facility];  Service: Cardiovascular;  Laterality: N/A;  . CTR    . INTRAVASCULAR PRESSURE WIRE/FFR STUDY N/A 07/18/2016   Procedure: Intravascular Pressure Wire/FFR Study;  Surgeon: Yolonda Kida, MD;  Location: [facility];  Service: Cardiovascular;  Laterality: N/A;  . LEFT HEART CATH AND CORONARY ANGIOGRAPHY N/A 07/18/2016   Procedure: Left Heart Cath and Coronary Angiography;  Surgeon: Yolonda Kida, MD;  Location: [place] [facility];  Service: Cardiovascular;  Laterality: N/A;  . PARTIAL MASTECTOMY WITH NEEDLE LOCALIZATION Right 03/25/2017   Procedure: PARTIAL MASTECTOMY WITH NEEDLE LOCALIZATION;  Surgeon: Herbert Pun, MD;  Location: [facility];  Service: General;  Laterality: Right;  . SENTINEL NODE BIOPSY Right 03/25/2017   Procedure: SENTINEL NODE BIOPSY;  Surgeon: Herbert Pun, MD;  Location: [facility];  Service: General;  Laterality: Right;  . TRIGGER FINGER RELEASE Bilateral     Social History   Socioeconomic History  . Marital status: Married    Spouse name: Not on file  . Number of children: Not on file  . Years of education: Not on file  . Highest education level: Not on file  Occupational History  . Not on file  Social Needs  . Financial resource strain: Not on file  . Food insecurity:    Worry: Not on file    Inability: Not on file  . Transportation needs:    Medical: Not on file    Non-medical: Not on file  Tobacco Use  . Smoking status: Never Smoker  . Smokeless tobacco: Never Used  Substance and Sexual Activity  . Alcohol use: No  . Drug use: No  . Sexual activity: Not on file  Lifestyle  . Physical activity:    Days per week: Not on file    Minutes per session: Not on file  . Stress: Not on file  Relationships  . Social connections:    Talks on phone: Not on file    Gets together: Not on file    Attends religious service: Not on file    Active member of club or  organization: Not on file    Attends meetings of clubs or organizations: Not on file    Relationship status: Not on file  . Intimate partner violence:    Fear of current or ex partner: Not on file    Emotionally abused: Not on file    Physically abused: Not on file    Forced sexual activity: Not on file  Other Topics Concern  . Not on file  Social History Narrative  . Not on file    Family History  Problem Relation Age of Onset  . Breast cancer Paternal Aunt        post men.      Current Outpatient Medications:  .  aspirin EC 81 MG tablet, Take 81 mg by mouth daily. , Disp: , Rfl:  .  atorvastatin (LIPITOR) 40 MG tablet, Take 40 mg by mouth every evening. , Disp: , Rfl:  .  Calcium Carbonate-Simethicone (ALKA-SELTZER HEARTBURN + GAS) 750-80 MG CHEW, Chew 1 tablet by mouth daily  as needed (heartburn)., Disp: , Rfl:  .  Camphor-Menthol-Capsicum (TIGER BALM PAIN RELIEVING) 80-24-16 MG PTCH, Place 1-3 patches onto the skin daily as needed (pain). , Disp: , Rfl:  .  cholecalciferol (VITAMIN D) 400 units TABS tablet, Take 400 Units by mouth at bedtime., Disp: , Rfl:  .  clopidogrel (PLAVIX) 75 MG tablet, Take 75 mg by mouth daily., Disp: , Rfl:  .  docusate sodium (COLACE) 100 MG capsule, Take 100 mg by mouth daily as needed (for constipation). , Disp: , Rfl:  .  ferrous sulfate 325 (65 FE) MG tablet, Take 325 mg by mouth at bedtime., Disp: , Rfl:  .  fexofenadine (ALLEGRA) 180 MG tablet, Take 180 mg by mouth at bedtime as needed for allergies or rhinitis., Disp: , Rfl:  .  isosorbide mononitrate (IMDUR) 30 MG 24 hr tablet, Take 30 mg by mouth daily., Disp: , Rfl:  .  metoprolol succinate (TOPROL-XL) 25 MG 24 hr tablet, Take 12.5-25 mg by mouth 2 (two) times daily. TAKE 0.5 TABLET (12.5 MG) BY MOUTH EVERY MORNING & TAKE 1 TABLET (25 MG) BY MOUTH IN THE EVENING, Disp: , Rfl:  .  nitroGLYCERIN (NITROSTAT) 0.4 MG SL tablet, Place 0.4 mg under the tongue every 5 (five) minutes as needed for  chest pain. , Disp: , Rfl:  .  PARoxetine (PAXIL) 20 MG tablet, Take 20 mg by mouth at bedtime., Disp: , Rfl:  .  Polyvinyl Alcohol-Povidone PF (REFRESH) 1.4-0.6 % SOLN, Place 1-2 drops into both eyes 3 (three) times daily as needed (for dry eyes.)., Disp: , Rfl:  .  trolamine salicylate (ASPERCREME) 10 % cream, Apply 1 application topically 4 (four) times daily as needed for muscle pain. , Disp: , Rfl:  .  vitamin B-12 (CYANOCOBALAMIN) 1000 MCG tablet, Take 1,000 mcg by mouth at bedtime., Disp: , Rfl:  .  HYDROcodone-acetaminophen (NORCO/VICODIN) 5-325 MG tablet, Take 1 tablet by mouth daily as needed for moderate pain., Disp: , Rfl:  .  sulfamethoxazole-trimethoprim (BACTRIM DS,SEPTRA DS) 800-160 MG tablet, Take 1 tablet by mouth 2 (two) times daily., Disp: 14 tablet, Rfl: 0  Physical exam:  Vitals:   05/29/17 1019  BP: 113/76  Pulse: 67  Resp: 16  Temp: 98.3 F (36.8 C)  TempSrc: Tympanic   Physical Exam  Constitutional: She is oriented to person, place, and time. Vital signs are normal.  HENT:  Head: Normocephalic and atraumatic.  Eyes: Pupils are equal, round, and reactive to light.  Neck: Normal range of motion.  Cardiovascular: Normal rate, regular rhythm and normal heart sounds.  No murmur heard. Pulmonary/Chest: Effort normal and breath sounds normal. She has no wheezes.  Abdominal: Soft. Normal appearance and bowel sounds are normal. She exhibits no distension. There is no tenderness.  Genitourinary:  Genitourinary Comments: Mild right sided CVA tenderness  Musculoskeletal: Normal range of motion. She exhibits no edema.  Neurological: She is alert and oriented to person, place, and time.  Skin: Skin is warm and dry. No rash noted.  Psychiatric: Judgment normal.     CMP Latest Ref Rng & Units 01/20/2017  Glucose 65 - 99 mg/dL 108(H)  BUN 6 - 20 mg/dL 10  Creatinine 0.44 - 1.00 mg/dL 0.63  Sodium 135 - 145 mmol/L 139  Potassium 3.5 - 5.1 mmol/L 4.5  Chloride 101 - 111  mmol/L 104  CO2 22 - 32 mmol/L 26  Calcium 8.9 - 10.3 mg/dL 9.0  Total Protein 6.4 - 8.2 g/dL -  Total Bilirubin  0.2 - 1.0 mg/dL -  Alkaline Phos 50 - 136 Unit/L -  AST 15 - 37 Unit/L -  ALT U/L -   CBC Latest Ref Rng & Units 05/20/2017  WBC 3.6 - 11.0 K/uL 6.0  Hemoglobin 12.0 - 16.0 g/dL 14.5  Hematocrit 35.0 - 47.0 % 42.1  Platelets 150 - 440 K/uL 243    No images are attached to the encounter.  No results found.   Assessment and plan- Patient is a 77 y.o. female who presents for dysuria.  1. Clinical stage Ia ER/PR positive, HER-2 negative invasive carcinoma of the upper outer quadrant of the right breast: Currently receiving daily radiation with Dr. Massie Maroon. Last treatment is scheduled for 06/05/17. Tolerating well. Only minor rash on right breast from radiation and "tingeling" at incision site of lumpectomy. Will not receive or need systemic chemotherapy per Dr. Grayland Ormond but would benefit from an AI for 5 years after the completion of radiation. She will need to be evaluated prior to initiation of AI. Do not see where this has been scheduled. Will get her set up at the end of radiation.   2. Urinary Tract Infection: STAT UA and Urine Culture. UA positive for UTI. RX Bactrim 800-160 mg BID for 7 days. Will  give her a prescription for Pyridium 200 mg TID PRN for dysuria as well. Prescriptions sent to pharmacy.     Visit Diagnosis 1. Dysuria   2. Urinary tract infection without hematuria, site unspecified     Patient expressed understanding and was in agreement with this plan. She also understands that She can call clinic at any time with any questions, concerns, or complaints.   Greater than 50% was spent in counseling and coordination of care with this patient including but not limited to discussion of the relevant topics above (See A&P) including, but not limited to diagnosis and management of acute and chronic medical conditions.    Faythe Casa, AGNP-C [facility]- [phone] Pager- [phone] 05/29/2017 11:30 AM

## 2017-05-29 NOTE — Progress Notes (Signed)
Patient here as acute add on today for painful, burning urination that started yesterday. She recently finished a Z pack for a suspected sinus infection. She reports some pain in her right breast from radiation treatment.

## 2017-05-29 NOTE — Patient Instructions (Signed)

## 2017-05-30 ENCOUNTER — Ambulatory Visit
Admission: RE | Admit: 2017-05-30 | Discharge: 2017-05-30 | Disposition: A | Discharge status: Home / Self Care | Payer: Medicare HMO | Source: Ambulatory Visit | Attending: Radiation Oncology | Admitting: Radiation Oncology

## 2017-05-30 ENCOUNTER — Ambulatory Visit: Admission: RE | Admit: 2017-05-30 | Payer: Medicare HMO | Source: Ambulatory Visit

## 2017-05-30 DIAGNOSIS — Z510 Encounter for antineoplastic radiation therapy: Secondary | ICD-10-CM | POA: Diagnosis not present

## 2017-05-30 DIAGNOSIS — C50411 Malignant neoplasm of upper-outer quadrant of right female breast: Secondary | ICD-10-CM | POA: Diagnosis not present

## 2017-05-30 DIAGNOSIS — Z170 Estrogen receptor positive status [ER+]: Secondary | ICD-10-CM | POA: Diagnosis not present

## 2017-05-30 LAB — URINE CULTURE

## 2017-05-31 ENCOUNTER — Ambulatory Visit
Admission: RE | Admit: 2017-05-31 | Discharge: 2017-05-31 | Disposition: A | Discharge status: Home / Self Care | Payer: Medicare HMO | Source: Ambulatory Visit | Attending: Radiation Oncology | Admitting: Radiation Oncology

## 2017-05-31 ENCOUNTER — Other Ambulatory Visit: Payer: Self-pay | Admitting: Oncology

## 2017-05-31 DIAGNOSIS — C50411 Malignant neoplasm of upper-outer quadrant of right female breast: Secondary | ICD-10-CM | POA: Diagnosis not present

## 2017-05-31 DIAGNOSIS — Z510 Encounter for antineoplastic radiation therapy: Secondary | ICD-10-CM | POA: Diagnosis not present

## 2017-05-31 DIAGNOSIS — Z170 Estrogen receptor positive status [ER+]: Secondary | ICD-10-CM | POA: Diagnosis not present

## 2017-05-31 MED ORDER — ONDANSETRON HCL 8 MG PO TABS: 8.0000 mg | ORAL_TABLET | Freq: Three times a day (TID) | ORAL | 0 refills | Status: DC | PRN

## 2017-06-03 ENCOUNTER — Ambulatory Visit
Admission: RE | Admit: 2017-06-03 | Discharge: 2017-06-03 | Disposition: A | Discharge status: Home / Self Care | Payer: Medicare HMO | Source: Ambulatory Visit | Attending: Radiation Oncology | Admitting: Radiation Oncology

## 2017-06-03 ENCOUNTER — Inpatient Hospital Stay: Payer: Medicare HMO

## 2017-06-03 DIAGNOSIS — C50411 Malignant neoplasm of upper-outer quadrant of right female breast: Secondary | ICD-10-CM | POA: Diagnosis not present

## 2017-06-03 DIAGNOSIS — Z170 Estrogen receptor positive status [ER+]: Secondary | ICD-10-CM | POA: Diagnosis not present

## 2017-06-03 DIAGNOSIS — Z510 Encounter for antineoplastic radiation therapy: Secondary | ICD-10-CM | POA: Diagnosis not present

## 2017-06-04 ENCOUNTER — Ambulatory Visit
Admission: RE | Admit: 2017-06-04 | Discharge: 2017-06-04 | Disposition: A | Discharge status: Home / Self Care | Payer: Medicare HMO | Source: Ambulatory Visit | Attending: Radiation Oncology | Admitting: Radiation Oncology

## 2017-06-04 DIAGNOSIS — C50411 Malignant neoplasm of upper-outer quadrant of right female breast: Secondary | ICD-10-CM | POA: Diagnosis not present

## 2017-06-04 DIAGNOSIS — Z510 Encounter for antineoplastic radiation therapy: Secondary | ICD-10-CM | POA: Diagnosis not present

## 2017-06-04 DIAGNOSIS — Z170 Estrogen receptor positive status [ER+]: Secondary | ICD-10-CM | POA: Diagnosis not present

## 2017-06-05 ENCOUNTER — Ambulatory Visit
Admission: RE | Admit: 2017-06-05 | Discharge: 2017-06-05 | Disposition: A | Discharge status: Home / Self Care | Payer: Medicare HMO | Source: Ambulatory Visit | Attending: Radiation Oncology | Admitting: Radiation Oncology

## 2017-06-05 DIAGNOSIS — C50411 Malignant neoplasm of upper-outer quadrant of right female breast: Secondary | ICD-10-CM | POA: Diagnosis not present

## 2017-06-05 DIAGNOSIS — Z510 Encounter for antineoplastic radiation therapy: Secondary | ICD-10-CM | POA: Diagnosis not present

## 2017-06-05 DIAGNOSIS — Z170 Estrogen receptor positive status [ER+]: Secondary | ICD-10-CM | POA: Diagnosis not present

## 2017-06-06 ENCOUNTER — Ambulatory Visit
Admission: RE | Admit: 2017-06-06 | Discharge: 2017-06-06 | Disposition: A | Discharge status: Home / Self Care | Payer: Medicare HMO | Source: Ambulatory Visit | Attending: Radiation Oncology | Admitting: Radiation Oncology

## 2017-06-06 DIAGNOSIS — Z510 Encounter for antineoplastic radiation therapy: Secondary | ICD-10-CM | POA: Diagnosis not present

## 2017-06-06 DIAGNOSIS — Z170 Estrogen receptor positive status [ER+]: Secondary | ICD-10-CM | POA: Diagnosis not present

## 2017-06-06 DIAGNOSIS — C50411 Malignant neoplasm of upper-outer quadrant of right female breast: Secondary | ICD-10-CM | POA: Diagnosis not present

## 2017-06-07 ENCOUNTER — Ambulatory Visit
Admission: RE | Admit: 2017-06-07 | Discharge: 2017-06-07 | Disposition: A | Discharge status: Home / Self Care | Payer: Medicare HMO | Source: Ambulatory Visit | Attending: Radiation Oncology | Admitting: Radiation Oncology

## 2017-06-07 DIAGNOSIS — C50411 Malignant neoplasm of upper-outer quadrant of right female breast: Secondary | ICD-10-CM | POA: Diagnosis not present

## 2017-06-07 DIAGNOSIS — Z510 Encounter for antineoplastic radiation therapy: Secondary | ICD-10-CM | POA: Diagnosis not present

## 2017-06-07 DIAGNOSIS — Z170 Estrogen receptor positive status [ER+]: Secondary | ICD-10-CM | POA: Diagnosis not present

## 2017-06-10 ENCOUNTER — Ambulatory Visit
Admission: RE | Admit: 2017-06-10 | Discharge: 2017-06-10 | Disposition: A | Discharge status: Home / Self Care | Payer: Medicare HMO | Source: Ambulatory Visit | Attending: Radiation Oncology | Admitting: Radiation Oncology

## 2017-06-10 ENCOUNTER — Ambulatory Visit: Payer: Medicare HMO

## 2017-06-10 DIAGNOSIS — Z510 Encounter for antineoplastic radiation therapy: Secondary | ICD-10-CM | POA: Diagnosis not present

## 2017-06-10 DIAGNOSIS — C50411 Malignant neoplasm of upper-outer quadrant of right female breast: Secondary | ICD-10-CM | POA: Diagnosis not present

## 2017-06-10 DIAGNOSIS — Z170 Estrogen receptor positive status [ER+]: Secondary | ICD-10-CM | POA: Diagnosis not present

## 2017-06-11 ENCOUNTER — Ambulatory Visit
Admission: RE | Admit: 2017-06-11 | Discharge: 2017-06-11 | Disposition: A | Discharge status: Home / Self Care | Payer: Medicare HMO | Source: Ambulatory Visit | Attending: Radiation Oncology | Admitting: Radiation Oncology

## 2017-06-11 DIAGNOSIS — Z510 Encounter for antineoplastic radiation therapy: Secondary | ICD-10-CM | POA: Diagnosis not present

## 2017-06-11 DIAGNOSIS — Z170 Estrogen receptor positive status [ER+]: Secondary | ICD-10-CM | POA: Diagnosis not present

## 2017-06-11 DIAGNOSIS — C50411 Malignant neoplasm of upper-outer quadrant of right female breast: Secondary | ICD-10-CM | POA: Diagnosis not present

## 2017-06-16 NOTE — Progress Notes (Signed)
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  INTERVAL HISTORY: Patient returns to clinic today for further evaluation and initiation of an aromatase inhibitor.  She recently finished XRT and tolerated her treatments well without significant side effects.  She admits to fatigue, but states this is improving.  She otherwise feels well.  She has no neurologic complaints.  She denies any recent fevers or illnesses.  She has a good appetite and denies weight loss.  She has no chest pain or shortness of breath.  She denies any nausea, vomiting, constipation, or diarrhea.  She has no urinary complaints.  Patient offers no further specific complaints today.  REVIEW OF SYSTEMS:   Review of Systems  Constitutional: Positive for malaise/fatigue. Negative for fever and weight loss.  Respiratory: Negative.  Negative for cough and shortness of breath.   Cardiovascular: Negative.  Negative for chest pain and leg swelling.  Gastrointestinal: Negative.  Negative for abdominal pain and constipation.  Genitourinary: Negative.  Negative for dysuria.  Musculoskeletal: Negative.  Negative for back pain.  Skin: Negative.  Negative for rash.  Neurological: Negative.  Negative for sensory change, focal weakness and weakness.  Psychiatric/Behavioral: The patient is nervous/anxious.     As per HPI. Otherwise, a complete review of systems is negative.  PAST MEDICAL HISTORY: Past Medical History:  Diagnosis Date  . Anginal pain ([place])    occas. took ntg last week  . Anxiety   . Breast cancer ([place])   . Complication of anesthesia   . Coronary artery disease   . Dyspnea    doe  . Fatty liver   . High  cholesterol   . Hypertension   . Myocardial infarction ([place])    5/18  . PONV (postoperative nausea and vomiting)     PAST SURGICAL HISTORY: Past Surgical History:  Procedure Laterality Date  . ABDOMINAL HYSTERECTOMY    . ACHILLES TENDON SURGERY    . BREAST BIOPSY Right 03/05/2017   Affirm Bx- invasive mammary  . BREAST LUMPECTOMY Right 03/25/2017   invasive mammary  . BUNIONECTOMY Bilateral   . CATARACT EXTRACTION W/PHACO Left 01/29/2017   Procedure: CATARACT EXTRACTION PHACO AND INTRAOCULAR LENS PLACEMENT (IOC);  Surgeon: Birder Robson, MD;  Location: [facility];  Service: Ophthalmology;  Laterality: Left;  [place] 00:49.9 AP% 20.3 CDE 10.11 Fluid Pack lot # [id number]  . CORONARY ANGIOPLASTY     STENT 5/18  . CORONARY STENT INTERVENTION N/A 07/18/2016   Procedure: Coronary Stent Intervention;  Surgeon: Yolonda Kida, MD;  Location: [place] [facility];  Service: Cardiovascular;  Laterality: N/A;  . CTR    . INTRAVASCULAR PRESSURE WIRE/FFR STUDY N/A 07/18/2016   Procedure: Intravascular Pressure Wire/FFR Study;  Surgeon: Yolonda Kida, MD;  Location: [place] [facility];  Service: Cardiovascular;  Laterality: N/A;  . LEFT HEART CATH AND CORONARY ANGIOGRAPHY N/A 07/18/2016   Procedure: Left Heart Cath and Coronary Angiography;  Surgeon: Yolonda Kida, MD;  Location: [place] [facility];  Service: Cardiovascular;  Laterality: N/A;  . PARTIAL MASTECTOMY WITH NEEDLE LOCALIZATION Right 03/25/2017   Procedure: PARTIAL MASTECTOMY WITH NEEDLE LOCALIZATION;  Surgeon: Herbert Pun, MD;  Location: [facility];  Service: General;  Laterality: Right;  . SENTINEL NODE  BIOPSY Right 03/25/2017   Procedure: SENTINEL NODE BIOPSY;  Surgeon: Herbert Pun, MD;  Location: [facility];  Service: General;  Laterality: Right;  . TRIGGER FINGER RELEASE Bilateral     FAMILY HISTORY: Family History  Problem Relation Age of Onset  . Breast cancer Paternal Aunt        post men.      ADVANCED DIRECTIVES (Y/N):  N  HEALTH MAINTENANCE: Social History   Tobacco Use  . Smoking status: Never Smoker  . Smokeless tobacco: Never Used  Substance Use Topics  . Alcohol use: No  . Drug use: No     Colonoscopy:  PAP:  Bone density:  Lipid panel:  Allergies  Allergen Reactions  . Metoprolol Other (See Comments)    Hypotension   . Other Diarrhea and Other (See Comments)    All mycin antibiotics   . Red Yeast Rice [Cholestin] Other (See Comments)    GI discomfort   . Statins Other (See Comments)    Myalgia, cramps     Current Outpatient Medications  Medication Sig Dispense Refill  . aspirin EC 81 MG tablet Take 81 mg by mouth daily.     Marland Kitchen atorvastatin (LIPITOR) 40 MG tablet Take 40 mg by mouth every evening.     . Calcium Carbonate-Simethicone (ALKA-SELTZER HEARTBURN + GAS) 750-80 MG CHEW Chew 1 tablet by mouth daily as needed (heartburn).    . Camphor-Menthol-Capsicum (TIGER BALM PAIN RELIEVING) 80-24-16 MG PTCH Place 1-3 patches onto the skin daily as needed (pain).     . cholecalciferol (VITAMIN D) 400 units TABS tablet Take 400 Units by mouth at bedtime.    . clopidogrel (PLAVIX) 75 MG tablet Take 75 mg by mouth daily.    Marland Kitchen docusate sodium (COLACE) 100 MG capsule Take 100 mg by mouth daily as needed (for constipation).     . ferrous sulfate 325 (65 FE) MG tablet Take 325 mg by mouth at bedtime.    . fexofenadine (ALLEGRA) 180 MG tablet Take 180 mg by mouth at bedtime as needed for allergies or rhinitis.    Marland Kitchen isosorbide mononitrate (IMDUR) 30 MG 24 hr tablet Take 30 mg by mouth daily.    . metoprolol succinate (TOPROL-XL) 25 MG 24 hr tablet Take 12.5-25 mg by mouth 2 (two) times daily. TAKE 0.5 TABLET (12.5 MG) BY MOUTH EVERY MORNING & TAKE 1 TABLET (25 MG) BY MOUTH IN THE EVENING    . PARoxetine (PAXIL) 20 MG tablet Take 20 mg by mouth at bedtime.    . Polyvinyl Alcohol-Povidone PF (REFRESH) 1.4-0.6 % SOLN Place 1-2 drops into both eyes 3 (three) times daily  as needed (for dry eyes.).    Marland Kitchen trolamine salicylate (ASPERCREME) 10 % cream Apply 1 application topically 4 (four) times daily as needed for muscle pain.     . vitamin B-12 (CYANOCOBALAMIN) 1000 MCG tablet Take 1,000 mcg by mouth at bedtime.    Marland Kitchen HYDROcodone-acetaminophen (NORCO/VICODIN) 5-325 MG tablet Take 1 tablet by mouth daily as needed for moderate pain.    Marland Kitchen letrozole (FEMARA) 2.5 MG tablet Take 1 tablet (2.5 mg total) by mouth daily. 30 tablet 3  . nitroGLYCERIN (NITROSTAT) 0.4 MG SL tablet Place 0.4 mg under the tongue every 5 (five) minutes as needed for chest pain.     Marland Kitchen ondansetron (ZOFRAN) 8 MG tablet Take 1 tablet (8 mg total) by mouth every 8 (eight) hours as needed for nausea or vomiting. (Patient not taking: Reported on 06/18/2017) 40 tablet 0  .  phenazopyridine (PYRIDIUM) 200 MG tablet Take 1 tablet (200 mg total) by mouth 3 (three) times daily as needed for pain. (Patient not taking: Reported on 06/18/2017) 20 tablet 0   No current facility-administered medications for this visit.     OBJECTIVE: Vitals:   06/18/17 1033  BP: 110/66  Pulse: 69  Resp: 18  Temp: (!) 95.5 F (35.3 C)     Body mass index is 39.03 kg/m.    ECOG FS:0 - Asymptomatic  General: Well-developed, well-nourished, no acute distress. Eyes: Pink conjunctiva, anicteric sclera. Breast: Exam performed recently by another provider. Lungs: Clear to auscultation bilaterally. Heart: Regular rate and rhythm. No rubs, murmurs, or gallops. Abdomen: Soft, nontender, nondistended. No organomegaly noted, normoactive bowel sounds. Musculoskeletal: No edema, cyanosis, or clubbing. Neuro: Alert, answering all questions appropriately. Cranial nerves grossly intact. Skin: No rashes or petechiae noted. Psych: Normal affect.  LAB RESULTS:  Lab Results  Component Value Date   NA 139 01/20/2017   K 4.5 01/20/2017   CL 104 01/20/2017   CO2 26 01/20/2017   GLUCOSE 108 (H) 01/20/2017   BUN 10 01/20/2017    CREATININE 0.63 01/20/2017   CALCIUM 9.0 01/20/2017   PROT 8.1 08/11/2011   ALBUMIN 3.8 08/11/2011   AST 24 08/11/2011   ALT 32 08/11/2011   ALKPHOS 72 08/11/2011   BILITOT 0.6 08/11/2011   GFRNONAA >60 01/20/2017   GFRAA >60 01/20/2017    Lab Results  Component Value Date   WBC 6.0 05/20/2017   NEUTROABS 6.1 01/20/2017   HGB 14.5 05/20/2017   HCT 42.1 05/20/2017   MCV 96.1 05/20/2017   PLT 243 05/20/2017     STUDIES: No results found.  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  Oncotype DX score 0.  PLAN:    1. 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  Because she had an Oncotype DX score of 0 and was low risk, she did not require adjuvant chemotherapy.  She recently completed XRT.  Patient was given a prescription for letrozole today which she will take for 5 years completing in April 2024.  We will get a baseline bone mineral density in the next 1 to 2 weeks.  Return to clinic in 3 months for further evaluation.  Approximately 30 minutes was spent in discussion of which greater than 50% was consultation.  Patient expressed understanding and was in agreement with this plan. She also understands that She can call clinic at any time with any questions, concerns, or complaints.   Cancer Staging Primary cancer of upper outer quadrant of right female breast [facility]) Staging form: Breast, AJCC 8th Edition - Clinical stage from 03/14/2017: Stage IA (cT1b, cN0, cM0, G2, ER: Positive, PR: Positive, HER2: Negative) - Signed by Lloyd Huger, MD on 03/15/2017   Lloyd Huger, MD   06/19/2017 4:07 PM

## 2017-06-18 ENCOUNTER — Other Ambulatory Visit: Payer: Self-pay

## 2017-06-18 ENCOUNTER — Inpatient Hospital Stay: Payer: Medicare HMO | Admitting: Oncology

## 2017-06-18 VITALS — BP 110/66 | HR 69 | Temp 95.5°F | Resp 18 | Wt 213.4 lb

## 2017-06-18 DIAGNOSIS — Z923 Personal history of irradiation: Secondary | ICD-10-CM

## 2017-06-18 DIAGNOSIS — R5383 Other fatigue: Secondary | ICD-10-CM | POA: Diagnosis not present

## 2017-06-18 DIAGNOSIS — Z170 Estrogen receptor positive status [ER+]: Secondary | ICD-10-CM | POA: Diagnosis not present

## 2017-06-18 DIAGNOSIS — R450 Nervousness: Secondary | ICD-10-CM

## 2017-06-18 DIAGNOSIS — R5381 Other malaise: Secondary | ICD-10-CM

## 2017-06-18 DIAGNOSIS — C50411 Malignant neoplasm of upper-outer quadrant of right female breast: Secondary | ICD-10-CM

## 2017-06-18 DIAGNOSIS — N390 Urinary tract infection, site not specified: Secondary | ICD-10-CM | POA: Diagnosis not present

## 2017-06-18 MED ORDER — LETROZOLE 2.5 MG PO TABS: 2.5000 mg | ORAL_TABLET | Freq: Every day | ORAL | 3 refills | Status: DC

## 2017-06-18 NOTE — Progress Notes (Signed)
Here for follow up  Stated feeling tired . Feels like she is" getting better each day "

## 2017-06-20 ENCOUNTER — Other Ambulatory Visit: Payer: Medicare HMO

## 2017-06-25 DIAGNOSIS — E559 Vitamin D deficiency, unspecified: Secondary | ICD-10-CM | POA: Diagnosis not present

## 2017-06-25 DIAGNOSIS — E6609 Other obesity due to excess calories: Secondary | ICD-10-CM | POA: Diagnosis not present

## 2017-06-25 DIAGNOSIS — E782 Mixed hyperlipidemia: Secondary | ICD-10-CM | POA: Diagnosis not present

## 2017-06-25 DIAGNOSIS — F411 Generalized anxiety disorder: Secondary | ICD-10-CM | POA: Diagnosis not present

## 2017-06-25 DIAGNOSIS — I10 Essential (primary) hypertension: Secondary | ICD-10-CM | POA: Diagnosis not present

## 2017-07-09 DIAGNOSIS — Z170 Estrogen receptor positive status [ER+]: Secondary | ICD-10-CM | POA: Diagnosis not present

## 2017-07-09 DIAGNOSIS — C50411 Malignant neoplasm of upper-outer quadrant of right female breast: Secondary | ICD-10-CM | POA: Diagnosis not present

## 2017-07-17 ENCOUNTER — Ambulatory Visit
Admission: RE | Admit: 2017-07-17 | Discharge: 2017-07-17 | Disposition: A | Discharge status: Home / Self Care | Payer: Medicare HMO | Source: Ambulatory Visit | Attending: Oncology | Admitting: Oncology

## 2017-07-17 ENCOUNTER — Encounter (INDEPENDENT_AMBULATORY_CARE_PROVIDER_SITE_OTHER): Payer: Self-pay

## 2017-07-17 DIAGNOSIS — Z780 Asymptomatic menopausal state: Secondary | ICD-10-CM | POA: Insufficient documentation

## 2017-07-17 DIAGNOSIS — Z79811 Long term (current) use of aromatase inhibitors: Secondary | ICD-10-CM | POA: Diagnosis not present

## 2017-07-17 DIAGNOSIS — C50411 Malignant neoplasm of upper-outer quadrant of right female breast: Secondary | ICD-10-CM | POA: Insufficient documentation

## 2017-07-17 DIAGNOSIS — M85852 Other specified disorders of bone density and structure, left thigh: Secondary | ICD-10-CM | POA: Diagnosis not present

## 2017-07-24 ENCOUNTER — Ambulatory Visit: Payer: Medicare HMO | Admitting: Radiation Oncology

## 2017-07-31 ENCOUNTER — Ambulatory Visit
Admission: RE | Admit: 2017-07-31 | Discharge: 2017-07-31 | Disposition: A | Discharge status: Home / Self Care | Payer: Medicare HMO | Source: Ambulatory Visit | Attending: Radiation Oncology | Admitting: Radiation Oncology

## 2017-07-31 ENCOUNTER — Encounter: Payer: Self-pay | Admitting: Radiation Oncology

## 2017-07-31 ENCOUNTER — Other Ambulatory Visit: Payer: Self-pay

## 2017-07-31 VITALS — BP 126/80 | HR 63 | Temp 97.7°F | Resp 20 | Wt 211.8 lb

## 2017-07-31 DIAGNOSIS — Z170 Estrogen receptor positive status [ER+]: Secondary | ICD-10-CM | POA: Insufficient documentation

## 2017-07-31 DIAGNOSIS — Z923 Personal history of irradiation: Secondary | ICD-10-CM | POA: Insufficient documentation

## 2017-07-31 DIAGNOSIS — C50411 Malignant neoplasm of upper-outer quadrant of right female breast: Secondary | ICD-10-CM | POA: Diagnosis not present

## 2017-07-31 NOTE — Progress Notes (Signed)
Radiation Oncology Follow up Note  Name: Linda Yang   Date:   07/31/2017 MRN:  [id number] DOB: [birth year]    This 77 y.o. female presents to the clinic today for one-month follow-up status post whole breast radiation to her right breast for stage I invasive mammary carcinoma ER/PR positive.  REFERRING PROVIDER: Dion Body, MD  HPI: patient is a 38 roll female now seen out 1 month having completed whole breast radiation to her right breast for stage I (T1 BN 0 M0) invasive mammary carcinoma status post wide local excision. Tumor is ER/PR positive. She has been started on.Femara. She is tolerating that well without side effect except for some mood problems.recent bone density was normal. She specifically denies breast tenderness cough or bone pain mild tenderness on positional change in her right breast.  COMPLICATIONS OF TREATMENT: none  FOLLOW UP COMPLIANCE: keeps appointments   PHYSICAL EXAM:  BP 126/80   Pulse 63   Temp 97.7 F (36.5 C)   Resp 20   Wt 211 lb 12 oz (96.1 kg)   BMI 38.73 kg/m  Lungs are clear to A&P cardiac examination essentially unremarkable with regular rate and rhythm. No dominant mass or nodularity is noted in either breast in 2 positions examined. Incision is well-healed. No axillary or supraclavicular adenopathy is appreciated. Cosmetic result is excellent. Well-developed well-nourished patient in NAD. HEENT reveals PERLA, EOMI, discs not visualized.  Oral cavity is clear. No oral mucosal lesions are identified. Neck is clear without evidence of cervical or supraclavicular adenopathy. Lungs are clear to A&P. Cardiac examination is essentially unremarkable with regular rate and rhythm without murmur rub or thrill. Abdomen is benign with no organomegaly or masses noted. Motor sensory and DTR levels are equal and symmetric in the upper and lower extremities. Cranial nerves II through XII are grossly intact. Proprioception is intact. No peripheral adenopathy  or edema is identified. No motor or sensory levels are noted. Crude visual fields are within normal range.  RADIOLOGY RESULTS: bone study reviewed  PLAN: present time she is doing well with no evidence of disease progressing nicely 1 month out. I'm please were overall progress. I've asked to see her back in 4-5 months for follow-up. She continues on Femara without side effect. Patient knows to call with any concerns.  I would like to take this opportunity to thank you for allowing me to participate in the care of your patient.Noreene Filbert, MD

## 2017-09-16 DIAGNOSIS — C50111 Malignant neoplasm of central portion of right female breast: Secondary | ICD-10-CM | POA: Diagnosis not present

## 2017-09-16 DIAGNOSIS — Z4431 Encounter for fitting and adjustment of external right breast prosthesis: Secondary | ICD-10-CM | POA: Diagnosis not present

## 2017-09-23 DIAGNOSIS — R0789 Other chest pain: Secondary | ICD-10-CM | POA: Diagnosis not present

## 2017-09-23 DIAGNOSIS — R0600 Dyspnea, unspecified: Secondary | ICD-10-CM | POA: Diagnosis not present

## 2017-09-25 ENCOUNTER — Ambulatory Visit: Payer: Medicare HMO | Admitting: Oncology

## 2017-10-06 NOTE — Progress Notes (Signed)
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  INTERVAL HISTORY: Patient returns to clinic today for further evaluation and to assess her toleration of letrozole.  She is having increased hot flashes and "sweating" with letrozole.  She continues to have right breast tenderness, but otherwise feels well.  She has no neurologic complaints.  She denies any recent fevers or illnesses.  She has a good appetite and denies weight loss.  She has no chest pain or shortness of breath.  She denies any nausea, vomiting, constipation, or diarrhea.  She has no urinary complaints.  Patient offers no further specific complaints today.  REVIEW OF SYSTEMS:   Review of Systems  Constitutional: Negative.  Negative for fever, malaise/fatigue and weight loss.  Respiratory: Negative.  Negative for cough and shortness of breath.   Cardiovascular: Negative.  Negative for chest pain and leg swelling.  Gastrointestinal: Negative.  Negative for abdominal pain and constipation.  Genitourinary: Negative.  Negative for dysuria.  Musculoskeletal: Negative.  Negative for back pain.  Skin: Negative.  Negative for rash.  Neurological: Negative.  Negative for sensory change, focal weakness and weakness.  Psychiatric/Behavioral: Negative.  The patient is not nervous/anxious.     As per HPI. Otherwise, a complete review of systems is negative.  PAST MEDICAL HISTORY: Past Medical History:  Diagnosis Date  . Anginal pain ([place])    occas. took ntg last week  . Anxiety   . Breast cancer ([place])   . Complication of anesthesia   . Coronary artery disease   . Dyspnea    doe  . Fatty liver   . High cholesterol   .  Hypertension   . Myocardial infarction ([place])    5/18  . PONV (postoperative nausea and vomiting)     PAST SURGICAL HISTORY: Past Surgical History:  Procedure Laterality Date  . ABDOMINAL HYSTERECTOMY    . ACHILLES TENDON SURGERY    . BREAST BIOPSY Right 03/05/2017   Affirm Bx- invasive mammary  . BREAST LUMPECTOMY Right 03/25/2017   invasive mammary  . BUNIONECTOMY Bilateral   . CATARACT EXTRACTION W/PHACO Left 01/29/2017   Procedure: CATARACT EXTRACTION PHACO AND INTRAOCULAR LENS PLACEMENT (IOC);  Surgeon: Birder Robson, MD;  Location: [facility];  Service: Ophthalmology;  Laterality: Left;  [place] 00:49.9 AP% 20.3 CDE 10.11 Fluid Pack lot # [id number]  . CORONARY ANGIOPLASTY     STENT 5/18  . CORONARY STENT INTERVENTION N/A 07/18/2016   Procedure: Coronary Stent Intervention;  Surgeon: Yolonda Kida, MD;  Location: [place] [facility];  Service: Cardiovascular;  Laterality: N/A;  . CTR    . INTRAVASCULAR PRESSURE WIRE/FFR STUDY N/A 07/18/2016   Procedure: Intravascular Pressure Wire/FFR Study;  Surgeon: Yolonda Kida, MD;  Location: [place] [facility];  Service: Cardiovascular;  Laterality: N/A;  . LEFT HEART CATH AND CORONARY ANGIOGRAPHY N/A 07/18/2016   Procedure: Left Heart Cath and Coronary Angiography;  Surgeon: Yolonda Kida, MD;  Location: [place] [facility];  Service: Cardiovascular;  Laterality: N/A;  . PARTIAL MASTECTOMY WITH NEEDLE LOCALIZATION Right 03/25/2017   Procedure: PARTIAL MASTECTOMY WITH NEEDLE LOCALIZATION;  Surgeon: Herbert Pun, MD;  Location: [facility];  Service: General;  Laterality: Right;  . SENTINEL NODE BIOPSY Right  03/25/2017   Procedure: SENTINEL NODE BIOPSY;  Surgeon: Herbert Pun, MD;  Location: [facility];  Service: General;  Laterality: Right;  . TRIGGER FINGER RELEASE Bilateral     FAMILY HISTORY: Family History  Problem Relation Age of Onset  . Breast cancer Paternal Aunt        post men.     ADVANCED  DIRECTIVES (Y/N):  N  HEALTH MAINTENANCE: Social History   Tobacco Use  . Smoking status: Never Smoker  . Smokeless tobacco: Never Used  Substance Use Topics  . Alcohol use: No  . Drug use: No     Colonoscopy:  PAP:  Bone density:  Lipid panel:  Allergies  Allergen Reactions  . Metoprolol Other (See Comments)    Hypotension   . Other Diarrhea and Other (See Comments)    All mycin antibiotics   . Red Yeast Rice [Cholestin] Other (See Comments)    GI discomfort   . Statins Other (See Comments)    Myalgia, cramps     Current Outpatient Medications  Medication Sig Dispense Refill  . aspirin EC 81 MG tablet Take 81 mg by mouth daily.     Marland Kitchen atorvastatin (LIPITOR) 40 MG tablet Take 40 mg by mouth every evening.     . Calcium Carbonate-Simethicone (ALKA-SELTZER HEARTBURN + GAS) 750-80 MG CHEW Chew 1 tablet by mouth daily as needed (heartburn).    . Camphor-Menthol-Capsicum (TIGER BALM PAIN RELIEVING) 80-24-16 MG PTCH Place 1-3 patches onto the skin daily as needed (pain).     . cholecalciferol (VITAMIN D) 400 units TABS tablet Take 400 Units by mouth at bedtime.    . clopidogrel (PLAVIX) 75 MG tablet Take 75 mg by mouth daily.    Marland Kitchen docusate sodium (COLACE) 100 MG capsule Take 100 mg by mouth daily as needed (for constipation).     . ferrous sulfate 325 (65 FE) MG tablet Take 325 mg by mouth at bedtime.    . fexofenadine (ALLEGRA) 180 MG tablet Take 180 mg by mouth at bedtime as needed for allergies or rhinitis.    Marland Kitchen isosorbide mononitrate (IMDUR) 30 MG 24 hr tablet Take 30 mg by mouth daily.    Marland Kitchen letrozole (FEMARA) 2.5 MG tablet Take 1 tablet (2.5 mg total) by mouth daily. 30 tablet 3  . metoprolol succinate (TOPROL-XL) 25 MG 24 hr tablet Take 12.5-25 mg by mouth 2 (two) times daily. TAKE 0.5 TABLET (12.5 MG) BY MOUTH EVERY MORNING & TAKE 1 TABLET (25 MG) BY MOUTH IN THE EVENING    . nitroGLYCERIN (NITROSTAT) 0.4 MG SL tablet Place 0.4 mg under the tongue every 5 (five) minutes as  needed for chest pain.     Marland Kitchen ondansetron (ZOFRAN) 8 MG tablet Take 1 tablet (8 mg total) by mouth every 8 (eight) hours as needed for nausea or vomiting. (Patient not taking: Reported on 06/18/2017) 40 tablet 0  . PARoxetine (PAXIL) 20 MG tablet Take 20 mg by mouth at bedtime.    . phenazopyridine (PYRIDIUM) 200 MG tablet Take 1 tablet (200 mg total) by mouth 3 (three) times daily as needed for pain. (Patient not taking: Reported on 06/18/2017) 20 tablet 0  . Polyvinyl Alcohol-Povidone PF (REFRESH) 1.4-0.6 % SOLN Place 1-2 drops into both eyes 3 (three) times daily as needed (for dry eyes.).    Marland Kitchen trolamine salicylate (ASPERCREME) 10 % cream Apply 1 application topically 4 (four) times daily as needed for muscle pain.     . vitamin B-12 (CYANOCOBALAMIN) 1000 MCG  tablet Take 1,000 mcg by mouth at bedtime.     No current facility-administered medications for this visit.     OBJECTIVE: Vitals:   10/07/17 1024 10/07/17 1030  BP:  127/83  Pulse:  66  Resp: 16   Temp:  98.1 F (36.7 C)     Body mass index is 38.72 kg/m.    ECOG FS:0 - Asymptomatic  General: Well-developed, well-nourished, no acute distress. Eyes: Pink conjunctiva, anicteric sclera. HEENT: Normocephalic, moist mucous membranes. Breast: Bilateral breast and axilla without lumps or masses.  Right breast continues to be tender to palpation particularly in upper outer quadrant. Lungs: Clear to auscultation bilaterally. Heart: Regular rate and rhythm. No rubs, murmurs, or gallops. Abdomen: Soft, nontender, nondistended. No organomegaly noted, normoactive bowel sounds. Musculoskeletal: No edema, cyanosis, or clubbing. Neuro: Alert, answering all questions appropriately. Cranial nerves grossly intact. Skin: No rashes or petechiae noted. Psych: Normal affect.  LAB RESULTS:  Lab Results  Component Value Date   NA 139 01/20/2017   K 4.5 01/20/2017   CL 104 01/20/2017   CO2 26 01/20/2017   GLUCOSE 108 (H) 01/20/2017   BUN 10  01/20/2017   CREATININE 0.63 01/20/2017   CALCIUM 9.0 01/20/2017   PROT 8.1 08/11/2011   ALBUMIN 3.8 08/11/2011   AST 24 08/11/2011   ALT 32 08/11/2011   ALKPHOS 72 08/11/2011   BILITOT 0.6 08/11/2011   GFRNONAA >60 01/20/2017   GFRAA >60 01/20/2017    Lab Results  Component Value Date   WBC 6.0 05/20/2017   NEUTROABS 6.1 01/20/2017   HGB 14.5 05/20/2017   HCT 42.1 05/20/2017   MCV 96.1 05/20/2017   PLT 243 05/20/2017     STUDIES: No results found.  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  Oncotype DX score 0.  PLAN:    1. 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  Because she had an Oncotype DX score of 0 and was low risk, she did not require adjuvant chemotherapy.  She recently completed XRT.  Despite her hot flashes, patient wishes to continue letrozole.  We discussed switching to anastrozole, but patient declined at this time.  Continue treatment for a total of 5 years completing treatment in April 2024.  Patient's next mammogram is due in December 2019, therefore she will follow-up in 4 months after her mammogram to discuss the results. 2.  Postmenopausal: Patient had a bone mineral density on Jul 17, 2017 that reported T score of -1.0 which is considered normal.  Repeat in 2 years in May 2021. 3.  Hot flashes: Continue letrozole as above.  Will consider switching to anastrozole at next clinic visit if symptoms persist.     Patient expressed understanding and was in agreement with this plan. She also understands that She can call clinic at any time with any questions, concerns, or complaints.   Cancer Staging Primary cancer of upper outer quadrant of right female breast [facility]) Staging form: Breast, AJCC 8th Edition - Clinical stage from 03/14/2017: Stage IA (cT1b,  cN0, cM0, G2, ER: Positive, PR: Positive, HER2: Negative) - Signed by Lloyd Huger, MD on 03/15/2017   Lloyd Huger, MD   10/08/2017 8:43 AM

## 2017-10-07 ENCOUNTER — Other Ambulatory Visit: Payer: Self-pay

## 2017-10-07 ENCOUNTER — Inpatient Hospital Stay: Payer: Medicare HMO | Attending: Oncology | Admitting: Oncology

## 2017-10-07 ENCOUNTER — Encounter: Payer: Self-pay | Admitting: Oncology

## 2017-10-07 VITALS — BP 127/83 | HR 66 | Temp 98.1°F | Resp 16 | Ht 62.0 in | Wt 211.7 lb

## 2017-10-07 DIAGNOSIS — Z79811 Long term (current) use of aromatase inhibitors: Secondary | ICD-10-CM

## 2017-10-07 DIAGNOSIS — C50411 Malignant neoplasm of upper-outer quadrant of right female breast: Secondary | ICD-10-CM

## 2017-10-07 DIAGNOSIS — Z170 Estrogen receptor positive status [ER+]: Secondary | ICD-10-CM | POA: Diagnosis not present

## 2017-10-07 DIAGNOSIS — Z780 Asymptomatic menopausal state: Secondary | ICD-10-CM | POA: Insufficient documentation

## 2017-10-07 DIAGNOSIS — I10 Essential (primary) hypertension: Secondary | ICD-10-CM | POA: Diagnosis not present

## 2017-10-07 DIAGNOSIS — N951 Menopausal and female climacteric states: Secondary | ICD-10-CM | POA: Diagnosis not present

## 2017-10-07 DIAGNOSIS — Z923 Personal history of irradiation: Secondary | ICD-10-CM | POA: Insufficient documentation

## 2017-10-07 DIAGNOSIS — Z803 Family history of malignant neoplasm of breast: Secondary | ICD-10-CM | POA: Insufficient documentation

## 2017-10-07 NOTE — Progress Notes (Signed)
Patient here for follow up. She reports on going pain in her right breast and has noticed some nipple inversion on the right side.She can't remember when her last breast exam was performed.

## 2017-10-16 DIAGNOSIS — Z124 Encounter for screening for malignant neoplasm of cervix: Secondary | ICD-10-CM | POA: Diagnosis not present

## 2017-10-17 ENCOUNTER — Other Ambulatory Visit: Payer: Self-pay | Admitting: Oncology

## 2017-10-28 DIAGNOSIS — Z4431 Encounter for fitting and adjustment of external right breast prosthesis: Secondary | ICD-10-CM | POA: Diagnosis not present

## 2017-10-28 DIAGNOSIS — C50111 Malignant neoplasm of central portion of right female breast: Secondary | ICD-10-CM | POA: Diagnosis not present

## 2017-11-05 DIAGNOSIS — E782 Mixed hyperlipidemia: Secondary | ICD-10-CM | POA: Diagnosis not present

## 2017-11-05 DIAGNOSIS — Z0000 Encounter for general adult medical examination without abnormal findings: Secondary | ICD-10-CM | POA: Diagnosis not present

## 2017-11-05 DIAGNOSIS — E6609 Other obesity due to excess calories: Secondary | ICD-10-CM | POA: Diagnosis not present

## 2017-11-05 DIAGNOSIS — E559 Vitamin D deficiency, unspecified: Secondary | ICD-10-CM | POA: Diagnosis not present

## 2017-11-05 DIAGNOSIS — R441 Visual hallucinations: Secondary | ICD-10-CM | POA: Diagnosis not present

## 2017-11-05 DIAGNOSIS — I10 Essential (primary) hypertension: Secondary | ICD-10-CM | POA: Diagnosis not present

## 2017-11-12 DIAGNOSIS — E559 Vitamin D deficiency, unspecified: Secondary | ICD-10-CM | POA: Diagnosis not present

## 2017-11-12 DIAGNOSIS — E782 Mixed hyperlipidemia: Secondary | ICD-10-CM | POA: Diagnosis not present

## 2017-11-12 DIAGNOSIS — E669 Obesity, unspecified: Secondary | ICD-10-CM | POA: Diagnosis not present

## 2017-11-12 DIAGNOSIS — I200 Unstable angina: Secondary | ICD-10-CM | POA: Diagnosis not present

## 2017-11-12 DIAGNOSIS — I10 Essential (primary) hypertension: Secondary | ICD-10-CM | POA: Diagnosis not present

## 2017-11-12 DIAGNOSIS — I208 Other forms of angina pectoris: Secondary | ICD-10-CM | POA: Diagnosis not present

## 2017-11-12 DIAGNOSIS — R002 Palpitations: Secondary | ICD-10-CM | POA: Diagnosis not present

## 2017-11-12 DIAGNOSIS — Z8659 Personal history of other mental and behavioral disorders: Secondary | ICD-10-CM | POA: Diagnosis not present

## 2017-11-12 DIAGNOSIS — R0602 Shortness of breath: Secondary | ICD-10-CM | POA: Diagnosis not present

## 2017-11-12 DIAGNOSIS — I714 Abdominal aortic aneurysm, without rupture: Secondary | ICD-10-CM | POA: Diagnosis not present

## 2017-11-12 DIAGNOSIS — E538 Deficiency of other specified B group vitamins: Secondary | ICD-10-CM | POA: Diagnosis not present

## 2017-11-12 DIAGNOSIS — I2510 Atherosclerotic heart disease of native coronary artery without angina pectoris: Secondary | ICD-10-CM | POA: Diagnosis not present

## 2017-11-12 DIAGNOSIS — R5383 Other fatigue: Secondary | ICD-10-CM | POA: Diagnosis not present

## 2017-11-28 DIAGNOSIS — Z0389 Encounter for observation for other suspected diseases and conditions ruled out: Secondary | ICD-10-CM | POA: Diagnosis not present

## 2017-11-28 DIAGNOSIS — I714 Abdominal aortic aneurysm, without rupture: Secondary | ICD-10-CM | POA: Diagnosis not present

## 2018-01-02 ENCOUNTER — Ambulatory Visit: Payer: Medicare HMO | Admitting: Radiation Oncology

## 2018-01-09 ENCOUNTER — Ambulatory Visit
Admission: RE | Admit: 2018-01-09 | Discharge: 2018-01-09 | Disposition: A | Discharge status: Home / Self Care | Payer: Medicare HMO | Source: Ambulatory Visit | Attending: Radiation Oncology | Admitting: Radiation Oncology

## 2018-01-09 ENCOUNTER — Other Ambulatory Visit: Payer: Self-pay

## 2018-01-09 ENCOUNTER — Encounter: Payer: Self-pay | Admitting: Radiation Oncology

## 2018-01-09 VITALS — Temp 98.5°F | Resp 18 | Wt 212.2 lb

## 2018-01-09 DIAGNOSIS — Z79811 Long term (current) use of aromatase inhibitors: Secondary | ICD-10-CM | POA: Insufficient documentation

## 2018-01-09 DIAGNOSIS — C50411 Malignant neoplasm of upper-outer quadrant of right female breast: Secondary | ICD-10-CM | POA: Insufficient documentation

## 2018-01-09 DIAGNOSIS — Z923 Personal history of irradiation: Secondary | ICD-10-CM | POA: Diagnosis not present

## 2018-01-09 DIAGNOSIS — Z170 Estrogen receptor positive status [ER+]: Secondary | ICD-10-CM | POA: Diagnosis not present

## 2018-01-09 NOTE — Progress Notes (Signed)
Radiation Oncology Follow up Note  Name: Linda Yang   Date:   01/09/2018 MRN:  [id number] DOB: Sep 24, 1940    This 77 y.o. female presents to the clinic today for 6 month follow-up status post whole breast radiation to her right breast for stage I ER/PR positive invasive mammary carcinoma.  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 She is seen today in routine follow-up is doing well. She specifically denies breast tenderness cough or bone pain..she's currently on Femara tolerate that well without side effect.she has mammogram scheduled for December.  COMPLICATIONS OF TREATMENT: none  FOLLOW UP COMPLIANCE: keeps appointments   PHYSICAL EXAM:  Temp 98.5 F (36.9 C) (Tympanic)   Resp 18   Wt 212 lb 3.1 oz (96.2 kg)   BMI 38.81 kg/m  Lungs are clear to A&P cardiac examination essentially unremarkable with regular rate and rhythm. No dominant mass or nodularity is noted in either breast in 2 positions examined. Incision is well-healed. No axillary or supraclavicular adenopathy is appreciated. Cosmetic result is excellent.Well-developed well-nourished patient in NAD. HEENT reveals PERLA, EOMI, discs not visualized.  Oral cavity is clear. No oral mucosal lesions are identified. Neck is clear without evidence of cervical or supraclavicular adenopathy. Lungs are clear to A&P. Cardiac examination is essentially unremarkable with regular rate and rhythm without murmur rub or thrill. Abdomen is benign with no organomegaly or masses noted. Motor sensory and DTR levels are equal and symmetric in the upper and lower extremities. Cranial nerves II through XII are grossly intact. Proprioception is intact. No peripheral adenopathy or edema is identified. No motor or sensory levels are noted. Crude visual fields are within normal  range.  RADIOLOGY RESULTS: no current films for review  PLAN: at the present time she is doing well with no evidence of disease. I'm please were overall progress. I've asked to see her out in 1 year for follow-up. Patient knows to call sooner with any concerns.she continues on Femara without side effect.  I would like to take this opportunity to thank you for allowing me to participate in the care of your patient.Noreene Filbert, MD

## 2018-01-28 ENCOUNTER — Ambulatory Visit
Admission: RE | Admit: 2018-01-28 | Discharge: 2018-01-28 | Disposition: A | Discharge status: Home / Self Care | Payer: Medicare HMO | Source: Ambulatory Visit | Attending: Oncology | Admitting: Oncology

## 2018-01-28 DIAGNOSIS — C50411 Malignant neoplasm of upper-outer quadrant of right female breast: Secondary | ICD-10-CM

## 2018-01-28 DIAGNOSIS — R928 Other abnormal and inconclusive findings on diagnostic imaging of breast: Secondary | ICD-10-CM | POA: Diagnosis not present

## 2018-01-28 HISTORY — DX: Personal history of irradiation: Z92.3

## 2018-02-03 ENCOUNTER — Ambulatory Visit: Payer: Medicare HMO | Admitting: Oncology

## 2018-02-04 DIAGNOSIS — Z853 Personal history of malignant neoplasm of breast: Secondary | ICD-10-CM | POA: Diagnosis not present

## 2018-02-09 NOTE — Progress Notes (Signed)
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  INTERVAL HISTORY: Patient returns to clinic today for routine evaluation.  She no longer is having hot flashes with letrozole and is tolerating her treatment well.  She currently feels well and is asymptomatic.  She has no neurologic complaints.  She denies any recent fevers or illnesses.  She has a good appetite and denies weight loss.  She has no chest pain or shortness of breath.  She denies any nausea, vomiting, constipation, or diarrhea.  She has no urinary complaints.  Patient feels at her baseline offers no specific complaints today.  REVIEW OF SYSTEMS:   Review of Systems  Constitutional: Negative.  Negative for fever, malaise/fatigue and weight loss.  Respiratory: Negative.  Negative for cough and shortness of breath.   Cardiovascular: Negative.  Negative for chest pain and leg swelling.  Gastrointestinal: Negative.  Negative for abdominal pain and constipation.  Genitourinary: Negative.  Negative for dysuria.  Musculoskeletal: Negative.  Negative for back pain.  Skin: Negative.  Negative for rash.  Neurological: Negative.  Negative for sensory change, focal weakness and weakness.  Psychiatric/Behavioral: Negative.  The patient is not nervous/anxious.     As per HPI. Otherwise, a complete review of systems is negative.  PAST MEDICAL HISTORY: Past Medical History:  Diagnosis Date  . Anginal pain ([place])    occas. took ntg last week  . Anxiety   . Breast cancer ([place]) 02/2017   right breast  . Complication of anesthesia   . Coronary artery disease   . Dyspnea    doe  . Fatty liver   . High cholesterol   . Hypertension     . Myocardial infarction ([place])    5/18  . Personal history of radiation therapy   . PONV (postoperative nausea and vomiting)     PAST SURGICAL HISTORY: Past Surgical History:  Procedure Laterality Date  . ABDOMINAL HYSTERECTOMY    . ACHILLES TENDON SURGERY    . BREAST BIOPSY Right 03/05/2017   Affirm Bx- invasive mammary  . BREAST LUMPECTOMY Right 03/25/2017   invasive mammary  . BUNIONECTOMY Bilateral   . CATARACT EXTRACTION W/PHACO Left 01/29/2017   Procedure: CATARACT EXTRACTION PHACO AND INTRAOCULAR LENS PLACEMENT (IOC);  Surgeon: Birder Robson, MD;  Location: [facility];  Service: Ophthalmology;  Laterality: Left;  [place] 00:49.9 AP% 20.3 CDE 10.11 Fluid Pack lot # [id number]  . CORONARY ANGIOPLASTY     STENT 5/18  . CORONARY STENT INTERVENTION N/A 07/18/2016   Procedure: Coronary Stent Intervention;  Surgeon: Yolonda Kida, MD;  Location: [place] [facility];  Service: Cardiovascular;  Laterality: N/A;  . CTR    . INTRAVASCULAR PRESSURE WIRE/FFR STUDY N/A 07/18/2016   Procedure: Intravascular Pressure Wire/FFR Study;  Surgeon: Yolonda Kida, MD;  Location: [place] [facility];  Service: Cardiovascular;  Laterality: N/A;  . LEFT HEART CATH AND CORONARY ANGIOGRAPHY N/A 07/18/2016   Procedure: Left Heart Cath and Coronary Angiography;  Surgeon: Yolonda Kida, MD;  Location: [place] [facility];  Service: Cardiovascular;  Laterality: N/A;  . PARTIAL MASTECTOMY WITH NEEDLE LOCALIZATION Right 03/25/2017   Procedure: PARTIAL MASTECTOMY WITH NEEDLE LOCALIZATION;  Surgeon: Herbert Pun, MD;  Location: [facility];  Service:  General;  Laterality: Right;  . SENTINEL NODE BIOPSY Right 03/25/2017   Procedure: SENTINEL NODE BIOPSY;  Surgeon: Herbert Pun, MD;  Location: [facility];  Service: General;  Laterality: Right;  . TRIGGER FINGER RELEASE Bilateral     FAMILY HISTORY: Family History  Problem Relation Age of Onset  . Breast cancer Paternal Aunt        post  men.   . Breast cancer Cousin     ADVANCED DIRECTIVES (Y/N):  N  HEALTH MAINTENANCE: Social History   Tobacco Use  . Smoking status: Never Smoker  . Smokeless tobacco: Never Used  Substance Use Topics  . Alcohol use: No  . Drug use: No     Colonoscopy:  PAP:  Bone density:  Lipid panel:  Allergies  Allergen Reactions  . Atorvastatin Other (See Comments)    Leg cramps.  . Metoprolol Other (See Comments)    Hypotension   . Other Diarrhea and Other (See Comments)    All mycin antibiotics   . Red Yeast Rice [Cholestin] Other (See Comments)    GI discomfort   . Statins Other (See Comments)    Myalgia, cramps     Current Outpatient Medications  Medication Sig Dispense Refill  . aspirin EC 81 MG tablet Take 81 mg by mouth daily.     . Calcium Carbonate-Simethicone (ALKA-SELTZER HEARTBURN + GAS) 750-80 MG CHEW Chew 1 tablet by mouth daily as needed (heartburn).    . Camphor-Menthol-Capsicum (TIGER BALM PAIN RELIEVING) 80-24-16 MG PTCH Place 1-3 patches onto the skin daily as needed (pain).     . cholecalciferol (VITAMIN D) 400 units TABS tablet Take 400 Units by mouth at bedtime.    . clopidogrel (PLAVIX) 75 MG tablet Take 75 mg by mouth daily.    Marland Kitchen docusate sodium (COLACE) 100 MG capsule Take 100 mg by mouth daily as needed (for constipation).     . ezetimibe (ZETIA) 10 MG tablet Take 10 mg by mouth daily.    . ferrous sulfate 325 (65 FE) MG tablet Take 325 mg by mouth at bedtime.    . fexofenadine (ALLEGRA) 180 MG tablet Take 180 mg by mouth at bedtime as needed for allergies or rhinitis.    Marland Kitchen isosorbide mononitrate (IMDUR) 30 MG 24 hr tablet Take 30 mg by mouth daily.    Marland Kitchen letrozole (FEMARA) 2.5 MG tablet Take 1 tablet (2.5 mg total) by mouth daily. 90 tablet 2  . metoprolol succinate (TOPROL-XL) 25 MG 24 hr tablet Take 12.5-25 mg by mouth 2 (two) times daily. TAKE 0.5 TABLET (12.5 MG) BY MOUTH EVERY MORNING & TAKE 1 TABLET (25 MG) BY MOUTH IN THE EVENING    .  nitroGLYCERIN (NITROSTAT) 0.4 MG SL tablet Place 0.4 mg under the tongue every 5 (five) minutes as needed for chest pain.     Marland Kitchen ondansetron (ZOFRAN) 8 MG tablet Take 1 tablet (8 mg total) by mouth every 8 (eight) hours as needed for nausea or vomiting. 40 tablet 0  . PARoxetine (PAXIL) 20 MG tablet Take 20 mg by mouth at bedtime.    . phenazopyridine (PYRIDIUM) 200 MG tablet Take 1 tablet (200 mg total) by mouth 3 (three) times daily as needed for pain. 20 tablet 0  . Polyvinyl Alcohol-Povidone PF (REFRESH) 1.4-0.6 % SOLN Place 1-2 drops into both eyes 3 (three) times daily as needed (for dry eyes.).    Marland Kitchen trolamine salicylate (ASPERCREME) 10 % cream Apply 1 application topically 4 (four) times daily as needed  for muscle pain.     . vitamin B-12 (CYANOCOBALAMIN) 1000 MCG tablet Take 1,000 mcg by mouth at bedtime.     No current facility-administered medications for this visit.     OBJECTIVE: Vitals:   02/10/18 0953  BP: 129/83  Pulse: 81  Temp: 97.8 F (36.6 C)     Body mass index is 38.41 kg/m.    ECOG FS:0 - Asymptomatic  General: Well-developed, well-nourished, no acute distress. Eyes: Pink conjunctiva, anicteric sclera. HEENT: Normocephalic, moist mucous membranes. Breast: Patient declined breast exam today. Lungs: Clear to auscultation bilaterally. Heart: Regular rate and rhythm. No rubs, murmurs, or gallops. Abdomen: Soft, nontender, nondistended. No organomegaly noted, normoactive bowel sounds. Musculoskeletal: No edema, cyanosis, or clubbing. Neuro: Alert, answering all questions appropriately. Cranial nerves grossly intact. Skin: No rashes or petechiae noted. Psych: Normal affect.  LAB RESULTS:  Lab Results  Component Value Date   NA 139 01/20/2017   K 4.5 01/20/2017   CL 104 01/20/2017   CO2 26 01/20/2017   GLUCOSE 108 (H) 01/20/2017   BUN 10 01/20/2017   CREATININE 0.63 01/20/2017   CALCIUM 9.0 01/20/2017   PROT 8.1 08/11/2011   ALBUMIN 3.8 08/11/2011   AST 24  08/11/2011   ALT 32 08/11/2011   ALKPHOS 72 08/11/2011   BILITOT 0.6 08/11/2011   GFRNONAA >60 01/20/2017   GFRAA >60 01/20/2017    Lab Results  Component Value Date   WBC 6.0 05/20/2017   NEUTROABS 6.1 01/20/2017   HGB 14.5 05/20/2017   HCT 42.1 05/20/2017   MCV 96.1 05/20/2017   PLT 243 05/20/2017     STUDIES: Mm Diag Breast Tomo Bilateral  Result Date: 01/28/2018 CLINICAL DATA:  77 year old female presenting for routine annual surveillance status post right breast lumpectomy in February of 2019. EXAM: DIGITAL DIAGNOSTIC BILATERAL MAMMOGRAM WITH CAD AND TOMO COMPARISON:  Previous exam(s). ACR Breast Density Category a: The breast tissue is almost entirely fatty. FINDINGS: Spot interval surgical changes in the upper-outer quadrant of the right breast are consistent with history of lumpectomy. No evidence of malignancy at the lumpectomy site. No suspicious calcifications, masses or areas of distortion are seen in the bilateral breasts. Mammographic images were processed with CAD. IMPRESSION: 1. Expected surgical changes are noted at the lumpectomy site in the upper-outer quadrant of the right breast. 2.  No mammographic evidence of malignancy in the bilateral breasts. RECOMMENDATION: Diagnostic mammogram is suggested in 1 year. (Code:DM-B-01Y) I have discussed the findings and recommendations with the patient. Results were also provided in writing at the conclusion of the visit. If applicable, a reminder letter will be sent to the patient regarding the next appointment. BI-RADS CATEGORY  2: Benign. Electronically Signed   By: Ammie Ferrier M.D.   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  Oncotype DX score 0.  PLAN:    1. 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  Because she had an Oncotype DX score of 0 and was low risk, she did not require adjuvant chemotherapy.  She has completed adjuvant XRT.  Continue letrozole for a total of 5 years completing in April 2024.  Her most recent mammogram on January 28, 2018 was reported as BI-RADS 2.  Repeat in December 2020.  Return to clinic in 6 months  for routine evaluation.   2.  Postmenopausal: Patient had a bone mineral density on Jul 17, 2017 that reported T score of -1.0 which is considered normal.  Repeat in 2 years in May 2021. 3.  Hot flashes: Resolved.  Continue letrozole as above.   Patient expressed understanding and was in agreement with this plan. She also understands that She can call clinic at any time with any questions, concerns, or complaints.   Cancer Staging Primary cancer of upper outer quadrant of right female breast [facility]) Staging form: Breast, AJCC 8th Edition - Clinical stage from 03/14/2017: Stage IA (cT1b, cN0, cM0, G2, ER: Positive, PR: Positive, HER2: Negative) - Signed by Lloyd Huger, MD on 03/15/2017   Lloyd Huger, MD   02/10/2018 12:41 PM

## 2018-02-10 ENCOUNTER — Other Ambulatory Visit: Payer: Self-pay

## 2018-02-10 ENCOUNTER — Inpatient Hospital Stay: Payer: Medicare HMO | Attending: Oncology | Admitting: Oncology

## 2018-02-10 VITALS — BP 129/83 | HR 81 | Temp 97.8°F | Wt 210.0 lb

## 2018-02-10 DIAGNOSIS — E538 Deficiency of other specified B group vitamins: Secondary | ICD-10-CM | POA: Insufficient documentation

## 2018-02-10 DIAGNOSIS — E119 Type 2 diabetes mellitus without complications: Secondary | ICD-10-CM | POA: Insufficient documentation

## 2018-02-10 DIAGNOSIS — Z7982 Long term (current) use of aspirin: Secondary | ICD-10-CM | POA: Diagnosis not present

## 2018-02-10 DIAGNOSIS — E6609 Other obesity due to excess calories: Secondary | ICD-10-CM | POA: Insufficient documentation

## 2018-02-10 DIAGNOSIS — C50411 Malignant neoplasm of upper-outer quadrant of right female breast: Secondary | ICD-10-CM | POA: Diagnosis not present

## 2018-02-10 DIAGNOSIS — I10 Essential (primary) hypertension: Secondary | ICD-10-CM | POA: Diagnosis not present

## 2018-02-10 DIAGNOSIS — Z79899 Other long term (current) drug therapy: Secondary | ICD-10-CM | POA: Diagnosis not present

## 2018-02-10 DIAGNOSIS — Z79811 Long term (current) use of aromatase inhibitors: Secondary | ICD-10-CM | POA: Diagnosis not present

## 2018-02-10 DIAGNOSIS — Z923 Personal history of irradiation: Secondary | ICD-10-CM | POA: Insufficient documentation

## 2018-02-10 DIAGNOSIS — E782 Mixed hyperlipidemia: Secondary | ICD-10-CM | POA: Insufficient documentation

## 2018-02-10 DIAGNOSIS — Z170 Estrogen receptor positive status [ER+]: Secondary | ICD-10-CM | POA: Diagnosis not present

## 2018-02-10 NOTE — Progress Notes (Signed)
Patient is here today to follow up on her right breast cancer and to go over her mammogram results. Patient stated that she fell last week and hit her right breast. It's tender at this time but doesn't believe that she broke anything.

## 2018-07-21 ENCOUNTER — Other Ambulatory Visit: Payer: Self-pay | Admitting: Oncology

## 2018-08-17 NOTE — Progress Notes (Deleted)
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  INTERVAL HISTORY: Patient returns to clinic today for routine evaluation.  She no longer is having hot flashes with letrozole and is tolerating her treatment well.  She currently feels well and is asymptomatic.  She has no neurologic complaints.  She denies any recent fevers or illnesses.  She has a good appetite and denies weight loss.  She has no chest pain or shortness of breath.  She denies any nausea, vomiting, constipation, or diarrhea.  She has no urinary complaints.  Patient feels at her baseline offers no specific complaints today.  REVIEW OF SYSTEMS:   Review of Systems  Constitutional: Negative.  Negative for fever, malaise/fatigue and weight loss.  Respiratory: Negative.  Negative for cough and shortness of breath.   Cardiovascular: Negative.  Negative for chest pain and leg swelling.  Gastrointestinal: Negative.  Negative for abdominal pain and constipation.  Genitourinary: Negative.  Negative for dysuria.  Musculoskeletal: Negative.  Negative for back pain.  Skin: Negative.  Negative for rash.  Neurological: Negative.  Negative for sensory change, focal weakness and weakness.  Psychiatric/Behavioral: Negative.  The patient is not nervous/anxious.     As per HPI. Otherwise, a complete review of systems is negative.  PAST MEDICAL HISTORY: Past Medical History:  Diagnosis Date  . Anginal pain ([place])    occas. took ntg last week  . Anxiety   . Breast cancer ([place]) 02/2017   right breast  . Complication of anesthesia   . Coronary artery disease   . Dyspnea    doe  . Fatty liver   . High cholesterol   . Hypertension    . Myocardial infarction ([place])    5/18  . Personal history of radiation therapy   . PONV (postoperative nausea and vomiting)     PAST SURGICAL HISTORY: Past Surgical History:  Procedure Laterality Date  . ABDOMINAL HYSTERECTOMY    . ACHILLES TENDON SURGERY    . BREAST BIOPSY Right 03/05/2017   Affirm Bx- invasive mammary  . BREAST LUMPECTOMY Right 03/25/2017   invasive mammary  . BUNIONECTOMY Bilateral   . CATARACT EXTRACTION W/PHACO Left 01/29/2017   Procedure: CATARACT EXTRACTION PHACO AND INTRAOCULAR LENS PLACEMENT (IOC);  Surgeon: Birder Robson, MD;  Location: [facility] ORS;  Service: Ophthalmology;  Laterality: Left;  [place] 00:49.9 AP% 20.3 CDE 10.11 Fluid Pack lot # [id number]  . CORONARY ANGIOPLASTY     STENT 5/18  . CORONARY STENT INTERVENTION N/A 07/18/2016   Procedure: Coronary Stent Intervention;  Surgeon: Yolonda Kida, MD;  Location: [place] [facility];  Service: Cardiovascular;  Laterality: N/A;  . CTR    . INTRAVASCULAR PRESSURE WIRE/FFR STUDY N/A 07/18/2016   Procedure: Intravascular Pressure Wire/FFR Study;  Surgeon: Yolonda Kida, MD;  Location: [place] [facility];  Service: Cardiovascular;  Laterality: N/A;  . LEFT HEART CATH AND CORONARY ANGIOGRAPHY N/A 07/18/2016   Procedure: Left Heart Cath and Coronary Angiography;  Surgeon: Yolonda Kida, MD;  Location: [place] [facility];  Service: Cardiovascular;  Laterality: N/A;  . PARTIAL MASTECTOMY WITH NEEDLE LOCALIZATION Right 03/25/2017   Procedure: PARTIAL MASTECTOMY WITH NEEDLE LOCALIZATION;  Surgeon: Herbert Pun, MD;  Location: [facility];  Service: General;  Laterality: Right;  . SENTINEL NODE BIOPSY Right 03/25/2017   Procedure: SENTINEL NODE BIOPSY;  Surgeon: Herbert Pun, MD;  Location: [facility];  Service: General;  Laterality: Right;  . TRIGGER FINGER RELEASE Bilateral     FAMILY HISTORY: Family History  Problem Relation Age of Onset  . Breast cancer Paternal Aunt        post  men.   . Breast cancer Cousin     ADVANCED DIRECTIVES (Y/N):  N  HEALTH MAINTENANCE: Social History   Tobacco Use  . Smoking status: Never Smoker  . Smokeless tobacco: Never Used  Substance Use Topics  . Alcohol use: No  . Drug use: No     Colonoscopy:  PAP:  Bone density:  Lipid panel:  Allergies  Allergen Reactions  . Atorvastatin Other (See Comments)    Leg cramps.  . Metoprolol Other (See Comments)    Hypotension   . Other Diarrhea and Other (See Comments)    All mycin antibiotics   . Red Yeast Rice [Cholestin] Other (See Comments)    GI discomfort   . Statins Other (See Comments)    Myalgia, cramps     Current Outpatient Medications  Medication Sig Dispense Refill  . aspirin EC 81 MG tablet Take 81 mg by mouth daily.     . Calcium Carbonate-Simethicone (ALKA-SELTZER HEARTBURN + GAS) 750-80 MG CHEW Chew 1 tablet by mouth daily as needed (heartburn).    . Camphor-Menthol-Capsicum (TIGER BALM PAIN RELIEVING) 80-24-16 MG PTCH Place 1-3 patches onto the skin daily as needed (pain).     . cholecalciferol (VITAMIN D) 400 units TABS tablet Take 400 Units by mouth at bedtime.    . clopidogrel (PLAVIX) 75 MG tablet Take 75 mg by mouth daily.    Marland Kitchen docusate sodium (COLACE) 100 MG capsule Take 100 mg by mouth daily as needed (for constipation).     . ezetimibe (ZETIA) 10 MG tablet Take 10 mg by mouth daily.    . ferrous sulfate 325 (65 FE) MG tablet Take 325 mg by mouth at bedtime.    . fexofenadine (ALLEGRA) 180 MG tablet Take 180 mg by mouth at bedtime as needed for allergies or rhinitis.    Marland Kitchen isosorbide mononitrate (IMDUR) 30 MG 24 hr tablet Take 30 mg by mouth daily.    Marland Kitchen letrozole (FEMARA) 2.5 MG tablet Take 1 tablet (2.5 mg total) by mouth daily. 90 tablet 0  . metoprolol succinate (TOPROL-XL) 25 MG 24 hr tablet Take 12.5-25 mg by mouth 2 (two) times daily. TAKE 0.5 TABLET (12.5 MG) BY MOUTH EVERY MORNING & TAKE 1 TABLET (25 MG) BY MOUTH IN THE EVENING    .  nitroGLYCERIN (NITROSTAT) 0.4 MG SL tablet Place 0.4 mg under the tongue every 5 (five) minutes as needed for chest pain.     Marland Kitchen ondansetron (ZOFRAN) 8 MG tablet Take 1 tablet (8 mg total) by mouth every 8 (eight) hours as needed for nausea or vomiting. 40 tablet 0  . PARoxetine (PAXIL) 20 MG tablet Take 20 mg by mouth at bedtime.    . phenazopyridine (PYRIDIUM) 200 MG tablet Take 1 tablet (200 mg total) by mouth 3 (three) times daily as needed for pain. 20 tablet 0  . Polyvinyl Alcohol-Povidone PF (REFRESH) 1.4-0.6 % SOLN Place 1-2 drops into both eyes 3 (three) times daily as needed (for dry eyes.).    Marland Kitchen trolamine salicylate (ASPERCREME) 10 % cream Apply 1 application topically 4 (four) times daily as needed for muscle  pain.     . vitamin B-12 (CYANOCOBALAMIN) 1000 MCG tablet Take 1,000 mcg by mouth at bedtime.     No current facility-administered medications for this visit.     OBJECTIVE: There were no vitals filed for this visit.   There is no height or weight on file to calculate BMI.    ECOG FS:0 - Asymptomatic  General: Well-developed, well-nourished, no acute distress. Eyes: Pink conjunctiva, anicteric sclera. HEENT: Normocephalic, moist mucous membranes. Breast: Patient declined breast exam today. Lungs: Clear to auscultation bilaterally. Heart: Regular rate and rhythm. No rubs, murmurs, or gallops. Abdomen: Soft, nontender, nondistended. No organomegaly noted, normoactive bowel sounds. Musculoskeletal: No edema, cyanosis, or clubbing. Neuro: Alert, answering all questions appropriately. Cranial nerves grossly intact. Skin: No rashes or petechiae noted. Psych: Normal affect.  LAB RESULTS:  Lab Results  Component Value Date   NA 139 01/20/2017   K 4.5 01/20/2017   CL 104 01/20/2017   CO2 26 01/20/2017   GLUCOSE 108 (H) 01/20/2017   BUN 10 01/20/2017   CREATININE 0.63 01/20/2017   CALCIUM 9.0 01/20/2017   PROT 8.1 08/11/2011   ALBUMIN 3.8 08/11/2011   AST 24 08/11/2011    ALT 32 08/11/2011   ALKPHOS 72 08/11/2011   BILITOT 0.6 08/11/2011   GFRNONAA >60 01/20/2017   GFRAA >60 01/20/2017    Lab Results  Component Value Date   WBC 6.0 05/20/2017   NEUTROABS 6.1 01/20/2017   HGB 14.5 05/20/2017   HCT 42.1 05/20/2017   MCV 96.1 05/20/2017   PLT 243 05/20/2017     STUDIES: No results found.  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  Oncotype DX score 0.  PLAN:    1. 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  Because she had an Oncotype DX score of 0 and was low risk, she did not require adjuvant chemotherapy.  She has completed adjuvant XRT.  Continue letrozole for a total of 5 years completing in April 2024.  Her most recent mammogram on January 28, 2018 was reported as BI-RADS 2.  Repeat in December 2020.  Return to clinic in 6 months for routine evaluation.   2.  Postmenopausal: Patient had a bone mineral density on Jul 17, 2017 that reported T score of -1.0 which is considered normal.  Repeat in 2 years in May 2021. 3.  Hot flashes: Resolved.  Continue letrozole as above.   Patient expressed understanding and was in agreement with this plan. She also understands that She can call clinic at any time with any questions, concerns, or complaints.   Cancer Staging Primary cancer of upper outer quadrant of right female breast [facility]) Staging form: Breast, AJCC 8th Edition - Clinical stage from 03/14/2017: Stage IA (cT1b, cN0, cM0, G2, ER: Positive, PR: Positive, HER2: Negative) - Signed by Lloyd Huger, MD on 03/15/2017   Lloyd Huger, MD   08/17/2018 9:44 AM

## 2018-08-19 ENCOUNTER — Inpatient Hospital Stay: Payer: Medicare Other | Admitting: Oncology

## 2018-08-29 ENCOUNTER — Other Ambulatory Visit: Payer: Self-pay

## 2018-08-31 NOTE — Progress Notes (Signed)
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  INTERVAL HISTORY: Patient returns to clinic today for routine 63-monthevaluation.  She continues to have occasional hot flashes with letrozole.  She has increased fatigue and shortness of breath and is followed by pulmonary.  She otherwise feels well. She has no neurologic complaints.  She denies any recent fevers or illnesses.  She has a good appetite and denies weight loss.  She denies any chest pain, shortness of breath, cough, or hemoptysis.  She denies any nausea, vomiting, constipation, or diarrhea.  She has no urinary complaints.  Patient feels at her baseline offers no specific complaints today.  REVIEW OF SYSTEMS:   Review of Systems  Constitutional: Positive for malaise/fatigue. Negative for fever and weight loss.  Respiratory: Positive for shortness of breath. Negative for cough.   Cardiovascular: Negative.  Negative for chest pain and leg swelling.  Gastrointestinal: Negative.  Negative for abdominal pain and constipation.  Genitourinary: Negative.  Negative for dysuria.  Musculoskeletal: Negative.  Negative for back pain.  Skin: Negative.  Negative for rash.  Neurological: Positive for sensory change. Negative for dizziness, focal weakness, weakness and headaches.  Psychiatric/Behavioral: Negative.  The patient is not nervous/anxious.     As per HPI. Otherwise, a complete review of systems is negative.  PAST MEDICAL HISTORY: Past Medical History:  Diagnosis Date  . Anginal pain ([facility]    occas. took ntg last week  . Anxiety   . Breast cancer ([facility] 02/2017   right breast  . Complication of anesthesia   . Coronary  artery disease   . Dyspnea    doe  . Fatty liver   . High cholesterol   . Hypertension   . Myocardial infarction ([facility]    5/18  . Personal history of radiation therapy   . PONV (postoperative nausea and vomiting)     PAST SURGICAL HISTORY: Past Surgical History:  Procedure Laterality Date  . ABDOMINAL HYSTERECTOMY    . ACHILLES TENDON SURGERY    . BREAST BIOPSY Right 03/05/2017   Affirm Bx- invasive mammary  . BREAST LUMPECTOMY Right 03/25/2017   invasive mammary  . BUNIONECTOMY Bilateral   . CATARACT EXTRACTION W/PHACO Left 01/29/2017   Procedure: CATARACT EXTRACTION PHACO AND INTRAOCULAR LENS PLACEMENT (IOC);  Surgeon: PBirder Robson MD;  Location: [facility];  Service: Ophthalmology;  Laterality: Left;  [place]:49.9 AP% 20.3 CDE 10.11 Fluid Pack lot # [id number] . CORONARY ANGIOPLASTY     STENT 5/18  . CORONARY STENT INTERVENTION N/A 07/18/2016   Procedure: Coronary Stent Intervention;  Surgeon: CYolonda Kida MD;  [facility];  Service: Cardiovascular;  Laterality: N/A;  . CTR    . INTRAVASCULAR PRESSURE WIRE/FFR STUDY N/A 07/18/2016   Procedure: Intravascular Pressure Wire/FFR Study;  Surgeon: CYolonda Kida MD;  [facility];  Service: Cardiovascular;  Laterality: N/A;  . LEFT HEART CATH AND CORONARY ANGIOGRAPHY N/A 07/18/2016   Procedure: Left Heart Cath and Coronary Angiography;  Surgeon: CYolonda Kida MD;  [facility];  Service: Cardiovascular;  Laterality: N/A;  . PARTIAL MASTECTOMY WITH NEEDLE LOCALIZATION Right 03/25/2017   Procedure: PARTIAL MASTECTOMY WITH NEEDLE LOCALIZATION;  Surgeon: CHerbert Pun  MD;  Location: [facility];  Service: General;  Laterality: Right;  . SENTINEL NODE BIOPSY Right 03/25/2017   Procedure: SENTINEL NODE BIOPSY;  Surgeon: Herbert Pun, MD;  Location: [facility];  Service: General;  Laterality: Right;  . TRIGGER FINGER RELEASE Bilateral     FAMILY HISTORY:  Family History  Problem Relation Age of Onset  . Breast cancer Paternal Aunt        post men.   . Breast cancer Cousin     ADVANCED DIRECTIVES (Y/N):  N  HEALTH MAINTENANCE: Social History   Tobacco Use  . Smoking status: Never Smoker  . Smokeless tobacco: Never Used  Substance Use Topics  . Alcohol use: No  . Drug use: No     Colonoscopy:  PAP:  Bone density:  Lipid panel:  Allergies  Allergen Reactions  . Atorvastatin Other (See Comments)    Leg cramps.  . Metoprolol Other (See Comments)    Hypotension   . Other Diarrhea and Other (See Comments)    All mycin antibiotics   . Red Yeast Rice [Cholestin] Other (See Comments)    GI discomfort   . Statins Other (See Comments)    Myalgia, cramps     Current Outpatient Medications  Medication Sig Dispense Refill  . aspirin EC 81 MG tablet Take 81 mg by mouth daily.     . Calcium Carbonate-Simethicone (ALKA-SELTZER HEARTBURN + GAS) 750-80 MG CHEW Chew 1 tablet by mouth daily as needed (heartburn).    . Camphor-Menthol-Capsicum (TIGER BALM PAIN RELIEVING) 80-24-16 MG PTCH Place 1-3 patches onto the skin daily as needed (pain).     . cholecalciferol (VITAMIN D) 400 units TABS tablet Take 400 Units by mouth at bedtime.    . clopidogrel (PLAVIX) 75 MG tablet Take 75 mg by mouth daily.    Marland Kitchen docusate sodium (COLACE) 100 MG capsule Take 100 mg by mouth daily as needed (for constipation).     . ezetimibe (ZETIA) 10 MG tablet Take 10 mg by mouth daily.    . ferrous sulfate 325 (65 FE) MG tablet Take 325 mg by mouth at bedtime.    . fexofenadine (ALLEGRA) 180 MG tablet Take 180 mg by mouth at bedtime as needed for allergies or rhinitis.    Marland Kitchen isosorbide mononitrate (IMDUR) 30 MG 24 hr tablet Take 30 mg by mouth daily.    Marland Kitchen letrozole (FEMARA) 2.5 MG tablet Take 1 tablet (2.5 mg total) by mouth daily. 90 tablet 0  . nitroGLYCERIN (NITROSTAT) 0.4 MG SL tablet Place 0.4 mg under the tongue every 5 (five) minutes as needed for chest  pain.     Marland Kitchen ondansetron (ZOFRAN) 8 MG tablet Take 1 tablet (8 mg total) by mouth every 8 (eight) hours as needed for nausea or vomiting. 40 tablet 0  . PARoxetine (PAXIL) 20 MG tablet Take 20 mg by mouth at bedtime.    . phenazopyridine (PYRIDIUM) 200 MG tablet Take 1 tablet (200 mg total) by mouth 3 (three) times daily as needed for pain. 20 tablet 0  . Polyvinyl Alcohol-Povidone PF (REFRESH) 1.4-0.6 % SOLN Place 1-2 drops into both eyes 3 (three) times daily as needed (for dry eyes.).    Marland Kitchen trolamine salicylate (ASPERCREME) 10 % cream Apply 1 application topically 4 (four) times daily as needed for muscle pain.     Marland Kitchen UNABLE TO FIND CBD Oil    . vitamin B-12 (CYANOCOBALAMIN) 1000 MCG tablet Take 1,000 mcg by mouth at bedtime.    Marland Kitchen  metoprolol succinate (TOPROL-XL) 25 MG 24 hr tablet Take 12.5-25 mg by mouth 2 (two) times daily. TAKE 0.5 TABLET (12.5 MG) BY MOUTH EVERY MORNING & TAKE 1 TABLET (25 MG) BY MOUTH IN THE EVENING     No current facility-administered medications for this visit.     OBJECTIVE: Vitals:   09/01/18 1021  BP: (!) 149/91  Pulse: 65  Resp: 18     Body mass index is 38.96 kg/m.    ECOG FS:0 - Asymptomatic  General: Well-developed, well-nourished, no acute distress. Eyes: Pink conjunctiva, anicteric sclera. HEENT: Normocephalic, moist mucous membranes. Breast: Bilateral breast and axilla without lumps or masses. Lungs: Clear to auscultation bilaterally. Heart: Regular rate and rhythm. No rubs, murmurs, or gallops. Abdomen: Soft, nontender, nondistended. No organomegaly noted, normoactive bowel sounds. Musculoskeletal: No edema, cyanosis, or clubbing. Neuro: Alert, answering all questions appropriately. Cranial nerves grossly intact. Skin: No rashes or petechiae noted. Psych: Normal affect.  LAB RESULTS:  Lab Results  Component Value Date   NA 139 01/20/2017   K 4.5 01/20/2017   CL 104 01/20/2017   CO2 26 01/20/2017   GLUCOSE 108 (H) 01/20/2017   BUN 10  01/20/2017   CREATININE 0.63 01/20/2017   CALCIUM 9.0 01/20/2017   PROT 8.1 08/11/2011   ALBUMIN 3.8 08/11/2011   AST 24 08/11/2011   ALT 32 08/11/2011   ALKPHOS 72 08/11/2011   BILITOT 0.6 08/11/2011   GFRNONAA >60 01/20/2017   GFRAA >60 01/20/2017    Lab Results  Component Value Date   WBC 6.0 05/20/2017   NEUTROABS 6.1 01/20/2017   HGB 14.5 05/20/2017   HCT 42.1 05/20/2017   MCV 96.1 05/20/2017   PLT 243 05/20/2017     STUDIES: No results found.  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  Oncotype DX score 0.  PLAN:    1. 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  Because she had an Oncotype DX score of 0 and was low risk, she did not require adjuvant chemotherapy.  She has completed adjuvant XRT.  Continue letrozole for total 5 years completing treatment in April 2024.  Her most recent mammogram on January 28, 2018 was reported as BI-RADS 2.  Repeat in December 2020.  Return to clinic in 6 months for routine evaluation. 2.  Postmenopausal: Patient had a bone mineral density on Jul 17, 2017 that reported T score of -1.0 which is considered normal.  Repeat in 2 years in May 2021. 3.  Shortness of breath/fatigue: Continue follow-up with pulmonary as indicated. 4.  Hot flashes: Mild, monitor.  Patient expressed understanding and was in agreement with this plan. She also understands that She can call clinic at any time with any questions, concerns, or complaints.   Cancer Staging Primary cancer of upper outer quadrant of right female breast [facility]) Staging form: Breast, AJCC 8th Edition - Clinical stage from 03/14/2017: Stage IA (cT1b, cN0, cM0, G2, ER: Positive, PR: Positive, HER2: Negative) - Signed by Lloyd Huger, MD on 03/15/2017   Lloyd Huger, MD    09/02/2018 6:52 AM

## 2018-09-01 ENCOUNTER — Encounter: Payer: Self-pay | Admitting: Oncology

## 2018-09-01 ENCOUNTER — Other Ambulatory Visit: Payer: Self-pay

## 2018-09-01 ENCOUNTER — Inpatient Hospital Stay: Payer: Medicare Other | Attending: Oncology | Admitting: Oncology

## 2018-09-01 DIAGNOSIS — C50411 Malignant neoplasm of upper-outer quadrant of right female breast: Secondary | ICD-10-CM | POA: Insufficient documentation

## 2018-09-01 DIAGNOSIS — Z79811 Long term (current) use of aromatase inhibitors: Secondary | ICD-10-CM | POA: Diagnosis not present

## 2018-09-01 DIAGNOSIS — Z170 Estrogen receptor positive status [ER+]: Secondary | ICD-10-CM | POA: Diagnosis not present

## 2018-09-01 DIAGNOSIS — I10 Essential (primary) hypertension: Secondary | ICD-10-CM | POA: Diagnosis not present

## 2018-09-01 NOTE — Progress Notes (Signed)
Patient here today for follow up regarding breast cancer. Patient reports shortness of breath, followed by pulmonary. Patient reports fatigue today. Patient reports hot flashes and sweating with letrozole.

## 2018-11-07 DIAGNOSIS — J4530 Mild persistent asthma, uncomplicated: Secondary | ICD-10-CM | POA: Insufficient documentation

## 2018-12-09 ENCOUNTER — Telehealth: Payer: Self-pay | Admitting: Licensed Clinical Social Worker

## 2018-12-15 NOTE — Telephone Encounter (Signed)
Attempted to reach Linda Yang to confirm her genetic counseling appointment on 10/29 at 2:30 PM. Was unable to reach her or leave a voicemail due to her voicemail being full.

## 2018-12-17 ENCOUNTER — Other Ambulatory Visit: Payer: Self-pay

## 2018-12-18 ENCOUNTER — Encounter: Payer: Self-pay | Admitting: Licensed Clinical Social Worker

## 2018-12-18 ENCOUNTER — Inpatient Hospital Stay: Payer: Medicare Other

## 2018-12-18 ENCOUNTER — Inpatient Hospital Stay: Payer: Medicare Other | Attending: Oncology | Admitting: Licensed Clinical Social Worker

## 2018-12-18 ENCOUNTER — Other Ambulatory Visit: Payer: Self-pay

## 2018-12-18 DIAGNOSIS — C50411 Malignant neoplasm of upper-outer quadrant of right female breast: Secondary | ICD-10-CM

## 2018-12-18 DIAGNOSIS — Z806 Family history of leukemia: Secondary | ICD-10-CM

## 2018-12-18 DIAGNOSIS — Z807 Family history of other malignant neoplasms of lymphoid, hematopoietic and related tissues: Secondary | ICD-10-CM | POA: Insufficient documentation

## 2018-12-18 DIAGNOSIS — Z803 Family history of malignant neoplasm of breast: Secondary | ICD-10-CM | POA: Insufficient documentation

## 2018-12-18 DIAGNOSIS — Z8051 Family history of malignant neoplasm of kidney: Secondary | ICD-10-CM | POA: Insufficient documentation

## 2018-12-18 NOTE — Progress Notes (Signed)
REFERRING PROVIDER: Lloyd Huger, MD [address] [place],  [place] [address]  PRIMARY PROVIDER:  Dion Body, MD  PRIMARY REASON FOR VISIT:  1. Primary cancer of upper outer quadrant of right female breast ([place])   2. Family history of breast cancer   3. Family history of lymphoma   4. Family history of leukemia   5. Family history of kidney cancer      HISTORY OF PRESENT ILLNESS:   Linda Yang, a 78 y.o. female, was seen for a [employer] cancer genetics consultation at the request of Dr. Grayland Ormond due to a personal and family history of cancer  Linda Yang presents to clinic today to discuss the possibility of a hereditary predisposition to cancer, genetic testing, and to further clarify her future cancer risks, as well as potential cancer risks for family members.   At the age of 48, Linda Yang was diagnosed with breast cancer. This was treated with lumpectomy and radiation.  CANCER HISTORY:  Oncology History   No history exists.    Past Medical History:  Diagnosis Date  . Anginal pain ([place])    occas. took ntg last week  . Anxiety   . Breast cancer ([place]) 02/2017   right breast  . Complication of anesthesia   . Coronary artery disease   . Dyspnea    doe  . Family history of breast cancer   . Family history of kidney cancer   . Family history of leukemia   . Family history of lymphoma   . Fatty liver   . High cholesterol   . Hypertension   . Myocardial infarction ([place])    5/18  . Personal history of radiation therapy   . PONV (postoperative nausea and vomiting)     Past Surgical History:  Procedure Laterality Date  . ABDOMINAL HYSTERECTOMY    . ACHILLES TENDON SURGERY    . BREAST BIOPSY Right 03/05/2017   Affirm Bx- invasive mammary  . BREAST LUMPECTOMY Right 03/25/2017   invasive mammary  . BUNIONECTOMY Bilateral   . CATARACT EXTRACTION W/PHACO Left 01/29/2017   Procedure: CATARACT EXTRACTION PHACO AND INTRAOCULAR LENS PLACEMENT (IOC);   Surgeon: Birder Robson, MD;  Location: [facility];  Service: Ophthalmology;  Laterality: Left;  [place] 00:49.9 AP% 20.3 CDE 10.11 Fluid Pack lot # [id number]  . CORONARY ANGIOPLASTY     STENT 5/18  . CORONARY STENT INTERVENTION N/A 07/18/2016   Procedure: Coronary Stent Intervention;  Surgeon: Yolonda Kida, MD;  Location: [place] [facility];  Service: Cardiovascular;  Laterality: N/A;  . CTR    . INTRAVASCULAR PRESSURE WIRE/FFR STUDY N/A 07/18/2016   Procedure: Intravascular Pressure Wire/FFR Study;  Surgeon: Yolonda Kida, MD;  Location: [place] [facility];  Service: Cardiovascular;  Laterality: N/A;  . LEFT HEART CATH AND CORONARY ANGIOGRAPHY N/A 07/18/2016   Procedure: Left Heart Cath and Coronary Angiography;  Surgeon: Yolonda Kida, MD;  Location: [place] [facility];  Service: Cardiovascular;  Laterality: N/A;  . PARTIAL MASTECTOMY WITH NEEDLE LOCALIZATION Right 03/25/2017   Procedure: PARTIAL MASTECTOMY WITH NEEDLE LOCALIZATION;  Surgeon: Herbert Pun, MD;  Location: [facility];  Service: General;  Laterality: Right;  . SENTINEL NODE BIOPSY Right 03/25/2017   Procedure: SENTINEL NODE BIOPSY;  Surgeon: Herbert Pun, MD;  Location: [facility];  Service: General;  Laterality: Right;  . TRIGGER FINGER RELEASE Bilateral     Social History   Socioeconomic History  . Marital status: Married    Spouse name:  Not on file  . Number of children: Not on file  . Years of education: Not on file  . Highest education level: Not on file  Occupational History  . Not on file  Social Needs  . Financial resource strain: Not on file  . Food insecurity    Worry: Not on file    Inability: Not on file  . Transportation needs    Medical: Not on file    Non-medical: Not on file  Tobacco Use  . Smoking status: Never Smoker  . Smokeless tobacco: Never Used  Substance and Sexual Activity  . Alcohol use: No  . Drug use: No  . Sexual activity: Not on file  Lifestyle  .  Physical activity    Days per week: Not on file    Minutes per session: Not on file  . Stress: Not on file  Relationships  . Social Herbalist on phone: Not on file    Gets together: Not on file    Attends religious service: Not on file    Active member of club or organization: Not on file    Attends meetings of clubs or organizations: Not on file    Relationship status: Not on file  Other Topics Concern  . Not on file  Social History Narrative  . Not on file     FAMILY HISTORY:  We obtained a detailed, 4-generation family history.  Significant diagnoses are listed below: Family History  Problem Relation Age of Onset  . Breast cancer Paternal Aunt        post men.   . Breast cancer Cousin   . Lymphoma Father   . Leukemia Maternal Aunt   . Cervical cancer Paternal Grandmother        d. 34s  . Breast cancer Cousin        dx 46  . Cervical cancer Cousin   . Kidney cancer Cousin     Linda Yang has 2 daughters and 1 son. She does not have siblings.   Linda Yang mother died of dementia at 43. Patient had 1 maternal aunt who is deceased. She had leukemia. This aunt's daughter also had leukemia. Patient's maternal grandmother passed from dementia, and maternal grandfather died of kidney disease.  Linda Yang father was diagnosed with lymphoma and died at 53. Patient had 4 paternal uncles, 5 paternal aunts. One of her aunts, Sarina Ill, had breast cancer In her 6s. 2 of her paternal cousins had breast cancer, one was diagnosed at 78. Another paternal cousin had cervical cancer. Another paternal cousin had kidney cancer. Patient's paternal grandmother had cervical cancer in her 58s and died in her 16s This grandmother's sister had a daughter was diagnosed with breast and colon cancer. Patient's paternal grandfather died at 14.   Linda Yang is unaware of previous family history of genetic testing for hereditary cancer risks. Patient's ancestors are of English/Caucasian descent.  There is no reported Ashkenazi Jewish ancestry. There is no known consanguinity.  GENETIC COUNSELING ASSESSMENT: Linda Yang is a 78 y.o. female with a personal and family history which is somewhat suggestive of a hereditary cancer syndrome and predisposition to cancer. We, therefore, discussed and recommended the following at today's visit.   DISCUSSION: We discussed that 5-10% of breast cancer is hereditary, with most cases associated with BRCA1/BRCA2 mutations.  There are other genes that can be associated with hereditary breast cancer syndromes. We discussed that testing is beneficial for several reasons including surgical  decision-making for breast cancer, knowing how to follow individuals after completing their treatment, and understand if other family members could be at risk for cancer and allow them to undergo genetic testing.   We reviewed the characteristics, features and inheritance patterns of hereditary cancer syndromes. We also discussed genetic testing, including the appropriate family members to test, the process of testing, insurance coverage and turn-around-time for results. We discussed the implications of a negative, positive and/or variant of uncertain significant result. We recommended Linda Yang pursue genetic testing for the [facility] Common Hereditary Cancers Panel + Kidney cancer gene panel.   The Common Hereditary Cancers Panel offered by [facility] includes sequencing and/or deletion duplication testing of the following 48 genes: APC, ATM, AXIN2, BARD1, BMPR1A, BRCA1, BRCA2, BRIP1, CDH1, CDKN2A (p14ARF), CDKN2A (p16INK4a), CKD4, CHEK2, CTNNA1, DICER1, EPCAM (Deletion/duplication testing only), GREM1 (promoter region deletion/duplication testing only), KIT, MEN1, MLH1, MSH2, MSH3, MSH6, MUTYH, NBN, NF1, NHTL1, PALB2, PDGFRA, PMS2, POLD1, POLE, PTEN, RAD50, RAD51C, RAD51D, RNF43, SDHB, SDHC, SDHD, SMAD4, SMARCA4. STK11, TP53, TSC1, TSC2, and VHL.  The following genes were evaluated for  sequence changes only: SDHA and HOXB13 c.251G>A variant only.  The [facility] Renal/Urinary Tract Cancers Panel analyzes the following 24 genes:BAP1 ,CDC73, CDKN1C, DICER1, DIS3L2, EPCAM, FH, FLCN, GPC3, MET, MLH1, MSH2, MSH6, PMS2, PTEN, SDHB, SDHC, SMARCA4, SMARCB1, TP53, TSC1, TSC2, VHL, WT1.  Based on Ms. Batz's personal and family history of cancer, she meets medical criteria for genetic testing. Despite that she meets criteria, she may still have an out of pocket cost.  PLAN: After considering the risks, benefits, and limitations, Ms. Gautney provided informed consent to pursue genetic testing and the blood sample was sent to [facility] for analysis of the Common Hereditary Cancers Panel + Kidney cancer panel. Results should be available within approximately 2-3 weeks' time, at which point they will be disclosed by telephone to Linda Yang, as will any additional recommendations warranted by these results. Linda Yang will receive a summary of her genetic counseling visit and a copy of her results once available. This information will also be available in Epic.   Linda Yang questions were answered to her satisfaction today. Our contact information was provided should additional questions or concerns arise. Thank you for the referral and allowing [place] to share in the care of your patient.   Faith Rogue, MS, [facility] Genetic Counselor [email or web address] Phone: [phone]  The patient was seen for a total of 30 minutes in face-to-face genetic counseling.  Her husband was also present. Dr. Grayland Ormond was available for discussion regarding this case.   _______________________________________________________________________ For Office Staff:  Number of people involved in session: 2 Was an Intern/ student involved with case: no

## 2018-12-31 ENCOUNTER — Telehealth: Payer: Self-pay | Admitting: Licensed Clinical Social Worker

## 2018-12-31 ENCOUNTER — Ambulatory Visit: Payer: Self-pay | Admitting: Licensed Clinical Social Worker

## 2018-12-31 ENCOUNTER — Encounter: Payer: Self-pay | Admitting: Licensed Clinical Social Worker

## 2018-12-31 DIAGNOSIS — Z1379 Encounter for other screening for genetic and chromosomal anomalies: Secondary | ICD-10-CM

## 2018-12-31 DIAGNOSIS — Z806 Family history of leukemia: Secondary | ICD-10-CM

## 2018-12-31 DIAGNOSIS — C50411 Malignant neoplasm of upper-outer quadrant of right female breast: Secondary | ICD-10-CM

## 2018-12-31 DIAGNOSIS — Z807 Family history of other malignant neoplasms of lymphoid, hematopoietic and related tissues: Secondary | ICD-10-CM

## 2018-12-31 DIAGNOSIS — Z8051 Family history of malignant neoplasm of kidney: Secondary | ICD-10-CM

## 2018-12-31 DIAGNOSIS — Z803 Family history of malignant neoplasm of breast: Secondary | ICD-10-CM

## 2018-12-31 NOTE — Progress Notes (Signed)
HPI:  Linda Yang was previously seen in the [place] [facility] due to a personal and family history of cancer and concerns regarding a hereditary predisposition to cancer. Please refer to our prior cancer genetics clinic note for more information regarding our discussion, assessment and recommendations, at the time. Linda Yang recent genetic test results were disclosed to her, as were recommendations warranted by these results. These results and recommendations are discussed in more detail below.  CANCER HISTORY:  Oncology History   No history exists.    FAMILY HISTORY:  We obtained a detailed, 4-generation family history.  Significant diagnoses are listed below: Family History  Problem Relation Age of Onset  . Breast cancer Paternal Aunt        post men.   . Breast cancer Cousin   . Lymphoma Father   . Leukemia Maternal Aunt   . Cervical cancer Paternal Grandmother        d. 19s  . Breast cancer Cousin        dx 63  . Cervical cancer Cousin   . Kidney cancer Cousin    Linda Yang has 2 daughters and 1 son. She does not have siblings.   Linda Yang mother died of dementia at 33. Patient had 1 maternal aunt who is deceased. She had leukemia. This aunt's daughter also had leukemia. Patient's maternal grandmother passed from dementia, and maternal grandfather died of kidney disease.  Linda Yang father was diagnosed with lymphoma and died at 52. Patient had 4 paternal uncles, 5 paternal aunts. One of her aunts, Sarina Ill, had breast cancer In her 56s. 2 of her paternal cousins had breast cancer, one was diagnosed at 70. Another paternal cousin had cervical cancer. Another paternal cousin had kidney cancer. Patient's paternal grandmother had cervical cancer in her 10s and died in her 69s This grandmother's sister had a daughter was diagnosed with breast and colon cancer. Patient's paternal grandfather died at 19.   Linda Yang is unaware of previous family history of genetic  testing for hereditary cancer risks. Patient's ancestors are of English/Caucasian descent. There is no reported Ashkenazi Jewish ancestry. There is no known consanguinity.  GENETIC TEST RESULTS: Genetic testing reported out on 12/30/2018 through the [facility] Common Hereditary Cancers Panel + Renal cancer panel found no pathogenic mutations. The Common Hereditary Cancers Panel offered by [facility] includes sequencing and/or deletion duplication testing of the following 48 genes: APC, ATM, AXIN2, BARD1, BMPR1A, BRCA1, BRCA2, BRIP1, CDH1, CDKN2A (p14ARF), CDKN2A (p16INK4a), CKD4, CHEK2, CTNNA1, DICER1, EPCAM (Deletion/duplication testing only), GREM1 (promoter region deletion/duplication testing only), KIT, MEN1, MLH1, MSH2, MSH3, MSH6, MUTYH, NBN, NF1, NHTL1, PALB2, PDGFRA, PMS2, POLD1, POLE, PTEN, RAD50, RAD51C, RAD51D, RNF43, SDHB, SDHC, SDHD, SMAD4, SMARCA4. STK11, TP53, TSC1, TSC2, and VHL.  The following genes were evaluated for sequence changes only: SDHA and HOXB13 c.251G>A variant only.The [facility] Renal/Urinary Tract Cancers Panel analyzes the following 24 genes:BAP1 ,CDC73, CDKN1C, DICER1, DIS3L2, EPCAM, FH, FLCN, GPC3, MET, MLH1, MSH2, MSH6, PMS2, PTEN, SDHB, SDHC, SMARCA4, SMARCB1, TP53, TSC1, TSC2, VHL, WT1. The test report has been scanned into EPIC and is located under the Molecular Pathology section of the Results Review tab.  A portion of the result report is included below for reference.     We discussed with Linda Yang that because current genetic testing is not perfect, it is possible there may be a gene mutation in one of these genes that current testing cannot detect, but that chance is small.  We also  discussed, that there could be another gene that has not yet been discovered, or that we have not yet tested, that is responsible for the cancer diagnoses in the family. It is also possible there is a hereditary cause for the cancer in the family that Linda Yang did not inherit and therefore was  not identified in her testing.  Therefore, it is important to remain in touch with cancer genetics in the future so that we can continue to offer Linda Yang the most up to date genetic testing.   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  At this time, it is unknown if this variant is associated with increased cancer risk or if this is a normal finding, but most variants such as this get reclassified to being inconsequential. It should not be used to make medical management decisions. With time, we suspect the lab will determine the significance of this variant, if any. If we do learn more about it, we will try to contact Linda Yang to discuss it further. However, it is important to stay in touch with [place] periodically and keep the address and phone number up to date.  ADDITIONAL GENETIC TESTING: We discussed with Linda Yang that her genetic testing was fairly extensive.  If there are genes identified to increase cancer risk that can be analyzed in the future, we would be happy to discuss and coordinate this testing at that time.    CANCER SCREENING RECOMMENDATIONS: Linda Yang test result is considered negative (normal).  This means that we have not identified a hereditary cause for her  personal and family history of cancer at this time. Most cancers happen by chance and this negative test suggests that her cancer may fall into this category.    While reassuring, this does not definitively rule out a hereditary predisposition to cancer. It is still possible that there could be genetic mutations that are undetectable by current technology. There could be genetic mutations in genes that have not been tested or identified to increase cancer risk.  Therefore, it is recommended she continue to follow the cancer management and screening guidelines provided by her oncology and primary healthcare provider.   An individual's cancer risk and  medical management are not determined by genetic test results alone. Overall cancer risk assessment incorporates additional factors, including personal medical history, family history, and any available genetic information that may result in a personalized plan for cancer prevention and surveillance.  RECOMMENDATIONS FOR FAMILY MEMBERS:  Relatives in this family might be at some increased risk of developing cancer, over the general population risk, simply due to the family history of cancer.  We recommended female relatives in this family have a yearly mammogram beginning at age 48, or 60 years younger than the earliest onset of cancer, an annual clinical breast exam, and perform monthly breast self-exams. Female relatives in this family should also have a gynecological exam as recommended by their primary provider. All family members should have a colonoscopy by age 67, or as directed by their physicians.   It is also possible there is a hereditary cause for the cancer in Linda Yang's family that she did not inherit and therefore was not identified in her.  Based on Linda Yang's family history, we recommended her paternal relatives, especially her cousin who had breast cancer at 68,  have genetic counseling and testing. Linda Yang will let us know if we can be of  any assistance in coordinating genetic counseling and/or testing for these family members.  FOLLOW-UP: Lastly, we discussed with Linda Yang that cancer genetics is a rapidly advancing field and it is possible that new genetic tests will be appropriate for her and/or her family members in the future. We encouraged her to remain in contact with cancer genetics on an annual basis so we can update her personal and family histories and let her know of advances in cancer genetics that may benefit this family.   Our contact number was provided. Ms. Louks questions were answered to her satisfaction, and she knows she is welcome to call us at anytime with  additional questions or concerns.   Faith Rogue, MS, [facility] Genetic Counselor [email or web address] Phone: [phone]

## 2018-12-31 NOTE — Telephone Encounter (Signed)
Revealed negative genetic testing.  Revealed that a VUS in ATM was identified. This normal result is reassuring and indicates that it is unlikely Linda Yang's cancer is due to a hereditary cause.  It is unlikely that there is an increased risk of another cancer due to a mutation in one of these genes.  However, genetic testing is not perfect, and cannot definitively rule out a hereditary cause.  It will be important for her to keep in contact with genetics to learn if any additional testing may be needed in the future.

## 2019-01-21 ENCOUNTER — Other Ambulatory Visit: Payer: Self-pay

## 2019-01-22 ENCOUNTER — Other Ambulatory Visit: Payer: Self-pay | Admitting: *Deleted

## 2019-01-22 ENCOUNTER — Ambulatory Visit
Admission: RE | Admit: 2019-01-22 | Discharge: 2019-01-22 | Disposition: A | Discharge status: Home / Self Care | Payer: Medicare Other | Source: Ambulatory Visit | Attending: Radiation Oncology | Admitting: Radiation Oncology

## 2019-01-22 ENCOUNTER — Encounter: Payer: Self-pay | Admitting: Radiation Oncology

## 2019-01-22 ENCOUNTER — Other Ambulatory Visit: Payer: Self-pay

## 2019-01-22 VITALS — Temp 98.7°F | Wt 212.7 lb

## 2019-01-22 DIAGNOSIS — Z79818 Long term (current) use of other agents affecting estrogen receptors and estrogen levels: Secondary | ICD-10-CM | POA: Diagnosis not present

## 2019-01-22 DIAGNOSIS — C50411 Malignant neoplasm of upper-outer quadrant of right female breast: Secondary | ICD-10-CM | POA: Insufficient documentation

## 2019-01-22 DIAGNOSIS — Z923 Personal history of irradiation: Secondary | ICD-10-CM | POA: Insufficient documentation

## 2019-01-22 DIAGNOSIS — Z170 Estrogen receptor positive status [ER+]: Secondary | ICD-10-CM | POA: Insufficient documentation

## 2019-01-22 MED ORDER — NYSTATIN 100000 UNIT/GM EX POWD: Freq: Two times a day (BID) | CUTANEOUS | 2 refills | Status: DC

## 2019-01-22 NOTE — Progress Notes (Signed)
Radiation Oncology Follow up Note  Name: Linda Yang   Date:   01/22/2019 MRN:  [id number] DOB: [birth year]    This 78 y.o. female presents to the clinic today for 45-month follow-up status post whole breast radiation to her right breast for stage I ER/PR positive invasive mammary carcinoma.  REFERRING PROVIDER: Dion Body, MD  HPI: Patient is a 78 year old female now out 18 months having completed whole breast radiation to her right breast for stage I ER/PR positive invasive mammary carcinoma.  Seen today in routine follow-up she is doing well.  She specifically denies breast tenderness cough or bone pain.  Her mammogram is due this month her last 1 back in December 2019.  Was BI-RADS 2 benign.  She is currently on Femara tolerating that well without side effect.  COMPLICATIONS OF TREATMENT: none  FOLLOW UP COMPLIANCE: keeps appointments   PHYSICAL EXAM:  Temp 98.7 F (37.1 C) (Tympanic)   Wt 212 lb 11.2 oz (96.5 kg)   BMI 38.90 kg/m  Lungs are clear to A&P cardiac examination essentially unremarkable with regular rate and rhythm. No dominant mass or nodularity is noted in either breast in 2 positions examined. Incision is well-healed. No axillary or supraclavicular adenopathy is appreciated. Cosmetic result is excellent.  Well-developed well-nourished patient in NAD. HEENT reveals PERLA, EOMI, discs not visualized.  Oral cavity is clear. No oral mucosal lesions are identified. Neck is clear without evidence of cervical or supraclavicular adenopathy. Lungs are clear to A&P. Cardiac examination is essentially unremarkable with regular rate and rhythm without murmur rub or thrill. Abdomen is benign with no organomegaly or masses noted. Motor sensory and DTR levels are equal and symmetric in the upper and lower extremities. Cranial nerves II through XII are grossly intact. Proprioception is intact. No peripheral adenopathy or edema is identified. No motor or sensory levels are noted.  Crude visual fields are within normal range.  RADIOLOGY RESULTS: Mammograms reviewed compatible with above-stated findings we will review her mammogram when she has a performed this month  PLAN: Present time patient continues to do well 18 months out with no evidence of disease.  I am pleased with her overall progress.  She will continue on Femara.  She will have mammogram this month which I will review when it becomes available.  I have asked to see her back in 1 year for follow-up.  Patient knows to call with any concerns at any time.  I would like to take this opportunity to thank you for allowing me to participate in the care of your patient.Noreene Filbert, MD

## 2019-02-24 ENCOUNTER — Ambulatory Visit
Admission: RE | Admit: 2019-02-24 | Discharge: 2019-02-24 | Disposition: A | Discharge status: Home / Self Care | Payer: Medicare Other | Source: Ambulatory Visit | Attending: Oncology | Admitting: Oncology

## 2019-02-24 DIAGNOSIS — C50411 Malignant neoplasm of upper-outer quadrant of right female breast: Secondary | ICD-10-CM

## 2019-02-28 NOTE — Progress Notes (Deleted)
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  INTERVAL HISTORY: Patient returns to clinic today for routine 50-monthevaluation.  She continues to have occasional hot flashes with letrozole.  She has increased fatigue and shortness of breath and is followed by pulmonary.  She otherwise feels well. She has no neurologic complaints.  She denies any recent fevers or illnesses.  She has a good appetite and denies weight loss.  She denies any chest pain, shortness of breath, cough, or hemoptysis.  She denies any nausea, vomiting, constipation, or diarrhea.  She has no urinary complaints.  Patient feels at her baseline offers no specific complaints today.  REVIEW OF SYSTEMS:   Review of Systems  Constitutional: Positive for malaise/fatigue. Negative for fever and weight loss.  Respiratory: Positive for shortness of breath. Negative for cough.   Cardiovascular: Negative.  Negative for chest pain and leg swelling.  Gastrointestinal: Negative.  Negative for abdominal pain and constipation.  Genitourinary: Negative.  Negative for dysuria.  Musculoskeletal: Negative.  Negative for back pain.  Skin: Negative.  Negative for rash.  Neurological: Positive for sensory change. Negative for dizziness, focal weakness, weakness and headaches.  Psychiatric/Behavioral: Negative.  The patient is not nervous/anxious.     As per HPI. Otherwise, a complete review of systems is negative.  PAST MEDICAL HISTORY: Past Medical History:  Diagnosis Date  . Anginal pain ([facility]    occas. took ntg last week  . Anxiety   . Breast cancer ([place] 02/2017   right breast  . Complication of anesthesia   . Coronary  artery disease   . Dyspnea    doe  . Family history of breast cancer   . Family history of kidney cancer   . Family history of leukemia   . Family history of lymphoma   . Fatty liver   . High cholesterol   . Hypertension   . Myocardial infarction ([facility]    5/18  . Personal history of radiation therapy   . PONV (postoperative nausea and vomiting)     PAST SURGICAL HISTORY: Past Surgical History:  Procedure Laterality Date  . ABDOMINAL HYSTERECTOMY    . ACHILLES TENDON SURGERY    . BREAST BIOPSY Right 03/05/2017   Affirm Bx- invasive mammary  . BREAST LUMPECTOMY Right 03/25/2017   invasive mammary, clear margins, LN negative  . BUNIONECTOMY Bilateral   . CATARACT EXTRACTION W/PHACO Left 01/29/2017   Procedure: CATARACT EXTRACTION PHACO AND INTRAOCULAR LENS PLACEMENT (IOC);  Surgeon: PBirder Robson MD;  Location: [facility];  Service: Ophthalmology;  Laterality: Left;  [place]00:49.9 AP% 20.3 CDE 10.11 Fluid Pack lot # [id number] . CORONARY ANGIOPLASTY     STENT 5/18  . CORONARY STENT INTERVENTION N/A 07/18/2016   Procedure: Coronary Stent Intervention;  Surgeon: CYolonda Kida MD;  [facility];  Service: Cardiovascular;  Laterality: N/A;  . CTR    . INTRAVASCULAR PRESSURE WIRE/FFR STUDY N/A 07/18/2016   Procedure: Intravascular Pressure Wire/FFR Study;  Surgeon: CYolonda Kida MD;  [facility];  Service: Cardiovascular;  Laterality: N/A;  . LEFT HEART CATH AND CORONARY ANGIOGRAPHY N/A 07/18/2016   Procedure: Left Heart Cath and Coronary Angiography;  Surgeon: CYolonda Kida  MD;  Location: [place] [facility];  Service: Cardiovascular;  Laterality: N/A;  . PARTIAL MASTECTOMY WITH NEEDLE LOCALIZATION Right 03/25/2017   Procedure: PARTIAL MASTECTOMY WITH NEEDLE LOCALIZATION;  Surgeon: Herbert Pun, MD;  Location: [facility];  Service: General;  Laterality: Right;  . SENTINEL NODE BIOPSY Right 03/25/2017   Procedure: SENTINEL NODE  BIOPSY;  Surgeon: Herbert Pun, MD;  Location: [facility];  Service: General;  Laterality: Right;  . TRIGGER FINGER RELEASE Bilateral     FAMILY HISTORY: Family History  Problem Relation Age of Onset  . Breast cancer Paternal Aunt        post men.   . Breast cancer Cousin   . Lymphoma Father   . Leukemia Maternal Aunt   . Cervical cancer Paternal Grandmother        d. 17s  . Breast cancer Cousin        dx 72  . Cervical cancer Cousin   . Kidney cancer Cousin     ADVANCED DIRECTIVES (Y/N):  N  HEALTH MAINTENANCE: Social History   Tobacco Use  . Smoking status: Never Smoker  . Smokeless tobacco: Never Used  Substance Use Topics  . Alcohol use: No  . Drug use: No     Colonoscopy:  PAP:  Bone density:  Lipid panel:  Allergies  Allergen Reactions  . Atorvastatin Other (See Comments)    Leg cramps.  . Metoprolol Other (See Comments)    Hypotension   . Other Diarrhea and Other (See Comments)    All mycin antibiotics   . Red Yeast Rice [Cholestin] Other (See Comments)    GI discomfort   . Statins Other (See Comments)    Myalgia, cramps     Current Outpatient Medications  Medication Sig Dispense Refill  . aspirin EC 81 MG tablet Take 81 mg by mouth daily.     . budesonide (PULMICORT) 0.25 MG/2ML nebulizer solution Take 0.25 mg by nebulization 2 (two) times daily as needed.    . Calcium Carbonate-Simethicone (ALKA-SELTZER HEARTBURN + GAS) 750-80 MG CHEW Chew 1 tablet by mouth daily as needed (heartburn).    . Camphor-Menthol-Capsicum (TIGER BALM PAIN RELIEVING) 80-24-16 MG PTCH Place 1-3 patches onto the skin daily as needed (pain).     . cholecalciferol (VITAMIN D) 400 units TABS tablet Take 400 Units by mouth at bedtime.    . clopidogrel (PLAVIX) 75 MG tablet Take 75 mg by mouth daily.    Marland Kitchen docusate sodium (COLACE) 100 MG capsule Take 100 mg by mouth daily as needed (for constipation).     . ezetimibe (ZETIA) 10 MG tablet Take 10 mg by mouth daily.    .  ferrous sulfate 325 (65 FE) MG tablet Take 325 mg by mouth at bedtime.    . fexofenadine (ALLEGRA) 180 MG tablet Take 180 mg by mouth at bedtime as needed for allergies or rhinitis.    Marland Kitchen isosorbide mononitrate (IMDUR) 30 MG 24 hr tablet Take 30 mg by mouth daily.    Marland Kitchen letrozole (FEMARA) 2.5 MG tablet Take 1 tablet (2.5 mg total) by mouth daily. 90 tablet 0  . metoprolol succinate (TOPROL-XL) 25 MG 24 hr tablet Take 12.5-25 mg by mouth 2 (two) times daily. TAKE 0.5 TABLET (12.5 MG) BY MOUTH EVERY MORNING & TAKE 1 TABLET (25 MG) BY MOUTH IN THE EVENING    . nitroGLYCERIN (NITROSTAT) 0.4 MG SL tablet Place 0.4 mg under the tongue every 5 (five) minutes as needed for chest pain.     Marland Kitchen  nystatin (MYCOSTATIN/NYSTOP) powder Apply topically 2 (two) times daily. 30 g 2  . ondansetron (ZOFRAN) 8 MG tablet Take 1 tablet (8 mg total) by mouth every 8 (eight) hours as needed for nausea or vomiting. 40 tablet 0  . PARoxetine (PAXIL) 20 MG tablet Take 20 mg by mouth at bedtime.    . Polyvinyl Alcohol-Povidone PF (REFRESH) 1.4-0.6 % SOLN Place 1-2 drops into both eyes 3 (three) times daily as needed (for dry eyes.).    Marland Kitchen trolamine salicylate (ASPERCREME) 10 % cream Apply 1 application topically 4 (four) times daily as needed for muscle pain.     Marland Kitchen UNABLE TO FIND CBD Oil    . vitamin B-12 (CYANOCOBALAMIN) 1000 MCG tablet Take 1,000 mcg by mouth at bedtime.    . Vitamin D, Ergocalciferol, (DRISDOL) 1.25 MG (50000 UT) CAPS capsule Take by mouth.     No current facility-administered medications for this visit.    OBJECTIVE: There were no vitals filed for this visit.   There is no height or weight on file to calculate BMI.    ECOG FS:0 - Asymptomatic  General: Well-developed, well-nourished, no acute distress. Eyes: Pink conjunctiva, anicteric sclera. HEENT: Normocephalic, moist mucous membranes. Breast: Bilateral breast and axilla without lumps or masses. Lungs: Clear to auscultation bilaterally. Heart: Regular  rate and rhythm. No rubs, murmurs, or gallops. Abdomen: Soft, nontender, nondistended. No organomegaly noted, normoactive bowel sounds. Musculoskeletal: No edema, cyanosis, or clubbing. Neuro: Alert, answering all questions appropriately. Cranial nerves grossly intact. Skin: No rashes or petechiae noted. Psych: Normal affect.  LAB RESULTS:  Lab Results  Component Value Date   NA 139 01/20/2017   K 4.5 01/20/2017   CL 104 01/20/2017   CO2 26 01/20/2017   GLUCOSE 108 (H) 01/20/2017   BUN 10 01/20/2017   CREATININE 0.63 01/20/2017   CALCIUM 9.0 01/20/2017   PROT 8.1 08/11/2011   ALBUMIN 3.8 08/11/2011   AST 24 08/11/2011   ALT 32 08/11/2011   ALKPHOS 72 08/11/2011   BILITOT 0.6 08/11/2011   GFRNONAA >60 01/20/2017   GFRAA >60 01/20/2017    Lab Results  Component Value Date   WBC 6.0 05/20/2017   NEUTROABS 6.1 01/20/2017   HGB 14.5 05/20/2017   HCT 42.1 05/20/2017   MCV 96.1 05/20/2017   PLT 243 05/20/2017     STUDIES: US Breast Limited Uni Left Inc Axilla  Result Date: 02/24/2019 CLINICAL DATA:  79 year old female with for annual follow-up. History of RIGHT breast cancer and lumpectomy in 2019. Recent LEFT breast injury 2 weeks ago with bruising within the UPPER-OUTER LEFT breast. EXAM: DIGITAL DIAGNOSTIC BILATERAL MAMMOGRAM WITH CAD AND TOMO ULTRASOUND LEFT BREAST COMPARISON:  Previous exam(s). ACR Breast Density Category b: There are scattered areas of fibroglandular density. FINDINGS: 2D and 3D full field views of both breasts and a magnification view of the lumpectomy site demonstrate no suspicious mass, nonsurgical distortion or worrisome calcifications. New focal asymmetry within the UPPER OUTER LEFT breast identified with interspersed fat. RIGHT lumpectomy changes again noted. Mammographic images were processed with CAD. On physical exam, ecchymosis at the 12:30 and 1 o'clock positions of the LEFT breast noted. Targeted ultrasound is performed, showing ill-defined  hyperechoic areas with cystic changes at the 12 30 and 1 o'clock positions of the LEFT breast underlying patient's ecchymosis, compatible with fat necrosis/posttraumatic changes. IMPRESSION: 1. Benign fat necrosis/posttraumatic changes within the UPPER-OUTER LEFT breast. 2. No evidence of breast malignancy. RECOMMENDATION: Bilateral diagnostic mammogram in 1 year. I have discussed the  findings and recommendations with the patient. If applicable, a reminder letter will be sent to the patient regarding the next appointment. BI-RADS CATEGORY  2: Benign. Electronically Signed   By: Margarette Canada M.D.   On: 02/24/2019 14:26   MM DIAG BREAST TOMO BILATERAL  Result Date: 02/24/2019 CLINICAL DATA:  79 year old female with for annual follow-up. History of RIGHT breast cancer and lumpectomy in 2019. Recent LEFT breast injury 2 weeks ago with bruising within the UPPER-OUTER LEFT breast. EXAM: DIGITAL DIAGNOSTIC BILATERAL MAMMOGRAM WITH CAD AND TOMO ULTRASOUND LEFT BREAST COMPARISON:  Previous exam(s). ACR Breast Density Category b: There are scattered areas of fibroglandular density. FINDINGS: 2D and 3D full field views of both breasts and a magnification view of the lumpectomy site demonstrate no suspicious mass, nonsurgical distortion or worrisome calcifications. New focal asymmetry within the UPPER OUTER LEFT breast identified with interspersed fat. RIGHT lumpectomy changes again noted. Mammographic images were processed with CAD. On physical exam, ecchymosis at the 12:30 and 1 o'clock positions of the LEFT breast noted. Targeted ultrasound is performed, showing ill-defined hyperechoic areas with cystic changes at the 12 30 and 1 o'clock positions of the LEFT breast underlying patient's ecchymosis, compatible with fat necrosis/posttraumatic changes. IMPRESSION: 1. Benign fat necrosis/posttraumatic changes within the UPPER-OUTER LEFT breast. 2. No evidence of breast malignancy. RECOMMENDATION: Bilateral diagnostic mammogram  in 1 year. I have discussed the findings and recommendations with the patient. If applicable, a reminder letter will be sent to the patient regarding the next appointment. BI-RADS CATEGORY  2: Benign. Electronically Signed   By: Margarette Canada M.D.   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  Oncotype DX score 0.  PLAN:    1. 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  Because she had an Oncotype DX score of 0 and was low risk, she did not require adjuvant chemotherapy.  She has completed adjuvant XRT.  Continue letrozole for total 5 years completing treatment in April 2024.  Her most recent mammogram on January 28, 2018 was reported as BI-RADS 2.  Repeat in December 2020.  Return to clinic in 6 months for routine evaluation. 2.  Postmenopausal: Patient had a bone mineral density on Jul 17, 2017 that reported T score of -1.0 which is considered normal.  Repeat in 2 years in May 2021. 3.  Shortness of breath/fatigue: Continue follow-up with pulmonary as indicated. 4.  Hot flashes: Mild, monitor.  Patient expressed understanding and was in agreement with this plan. She also understands that She can call clinic at any time with any questions, concerns, or complaints.   Cancer Staging Primary cancer of upper outer quadrant of right female breast [facility]) Staging form: Breast, AJCC 8th Edition - Clinical stage from 03/14/2017: Stage IA (cT1b, cN0, cM0, G2, ER: Positive, PR: Positive, HER2: Negative) - Signed by Lloyd Huger, MD on 03/15/2017   Lloyd Huger, MD   02/28/2019 8:38 AM

## 2019-03-04 ENCOUNTER — Inpatient Hospital Stay: Payer: Medicare Other | Admitting: Oncology

## 2019-04-17 ENCOUNTER — Other Ambulatory Visit: Payer: Self-pay | Admitting: Oncology

## 2019-05-14 ENCOUNTER — Other Ambulatory Visit: Payer: Self-pay | Admitting: Oncology

## 2019-05-14 ENCOUNTER — Telehealth: Payer: Self-pay | Admitting: Oncology

## 2019-05-14 NOTE — Telephone Encounter (Signed)
Called pt to give appts details per scehduling message and patient would like to speak with someone about a visit she had with another provider. She also has a cough that will not go away. She has some health concerns she wants to discuss with you.

## 2019-05-14 NOTE — Telephone Encounter (Signed)
Ok, the only other provider I see in Epic is Dr. Donella Stade back in December.

## 2019-05-15 ENCOUNTER — Telehealth: Payer: Self-pay | Admitting: Emergency Medicine

## 2019-05-15 NOTE — Telephone Encounter (Signed)
Returned patient's call from yesterday to find out what concerns patient needs addressed. No answer, message laft.

## 2019-06-05 NOTE — Progress Notes (Signed)
  [place]  [phone] [phone]) [phone]  ID: Linda Yang OB: May 21, 1940  MR#: [id number]  [id number]  Patient Care Team: Dion Body, MD as PCP - General (Family Medicine) Lloyd Huger, MD as Consulting Physician (Oncology)    Lloyd Huger, MD   06/12/2019 7:02 AM     This encounter was created in error - please disregard.

## 2019-06-10 ENCOUNTER — Telehealth: Payer: Self-pay | Admitting: *Deleted

## 2019-06-10 ENCOUNTER — Telehealth: Payer: Self-pay | Admitting: Emergency Medicine

## 2019-06-10 ENCOUNTER — Other Ambulatory Visit: Payer: Self-pay

## 2019-06-10 ENCOUNTER — Other Ambulatory Visit: Payer: Self-pay | Admitting: Oncology

## 2019-06-10 NOTE — Telephone Encounter (Signed)
Patient came into the clinic today because she thought her follow up appointment with Dr. Grayland Ormond was today, but when she arrived she was told it was actually tomorrow. She told the staff at the registration desk that she was sick this morning, and then she left to go home. Registration staff sent a secure chat message, and based upon that message, I called patient. Spoke to patient via telephone. She stated that she woke up this morning and ate her breakfast. A short time later, she became very nauseated and vomited several times. She felt a little better, but still not great. Patient was offered an appointment to come in today to be evaluated by NP in the symptom management clinic, but she stated that she would rather just come in tomorrow to see Dr. Grayland Ormond at her regularly scheduled appointment time. Patient was advised to call me back if she changes her mind about being seen today, or if she gets worse and not better, and we will bring her in sooner. Patient was given my cell number in case she has a hard time getting in touch with someone at the clinic. Patient verbalized understanding

## 2019-06-10 NOTE — Telephone Encounter (Signed)
Called pt to do pre-screening. Pt reports that her daughter, son-in-law, and grandson all tested positive for COVID this past weekend. She hasn't seen them for approximately 3 weeks, but was at a meeting this past weekend without a mask on. States that this morning she woke up feeling nauseated and threw up after eating breakfast. Please advise about what to do about tomorrow's appt.

## 2019-06-10 NOTE — Telephone Encounter (Signed)
Spoke with patient, she would prefer to wait till next week to come in. Spoke with Dr. Grayland Ormond regarding this and he states this is fine if this is her preference. Pt instructed to call clinic if she feels worse, but pt thinks she's having sinus drainage due to allergies and that is what made her nauseated. Pt verbalized understanding. Schedule message sent to r/s tomorrow's appt.

## 2019-06-11 ENCOUNTER — Inpatient Hospital Stay: Payer: Medicare Other | Admitting: Oncology

## 2019-06-13 NOTE — Progress Notes (Signed)
  [place]  [phone] [phone]) [phone]  ID: Linda Yang OB: 07-06-1940  MR#: [id number]  [id number]   Lloyd Huger, MD   06/19/2019 8:44 AM     This encounter was created in error - please disregard.

## 2019-06-17 ENCOUNTER — Encounter: Payer: Self-pay | Admitting: Oncology

## 2019-06-17 ENCOUNTER — Other Ambulatory Visit: Payer: Self-pay

## 2019-06-17 NOTE — Progress Notes (Signed)
Patient called for pre assessment. State she is still dealing with a bad cough and some sinus drainage. Had a coughing episode this morning and got sick.  States both her and husband have had Covid vaccines.  Patient reports pain in right side of esophagus that comes and goes. Sciatic nerve in left leg has been bothering patient.

## 2019-06-18 ENCOUNTER — Inpatient Hospital Stay: Payer: Medicare Other | Admitting: Oncology

## 2019-06-20 NOTE — Progress Notes (Signed)
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  INTERVAL HISTORY: Patient returns to clinic today for routine evaluation.  She was last evaluated in clinic in July 2020.  She had missed several appointments in the interim secondary to illness and Covid exposure.  Patient reports she was tested negative for Covid and has now received her vaccinations.  She continues to have hot flashes, but otherwise is tolerating letrozole well.  She has increased fatigue and shortness of breath and is followed by pulmonary.  She has right eye visual disturbance and headaches and is being evaluated by [facility] neurology.  She denies any recent fevers or illnesses.  She has a good appetite and denies weight loss.  She denies any chest pain, shortness of breath, cough, or hemoptysis.  She denies any nausea, vomiting, constipation, or diarrhea.  She has no urinary complaints.  Patient offers no further specific complaints today.  REVIEW OF SYSTEMS:   Review of Systems  Constitutional: Positive for malaise/fatigue. Negative for fever and weight loss.  Respiratory: Positive for shortness of breath. Negative for cough.   Cardiovascular: Negative.  Negative for chest pain and leg swelling.  Gastrointestinal: Negative.  Negative for abdominal pain and constipation.  Genitourinary: Negative.  Negative for dysuria.  Musculoskeletal: Negative.  Negative for back pain.  Skin: Negative.  Negative for rash.  Neurological: Positive for sensory change. Negative for dizziness, focal weakness, weakness and headaches.  Psychiatric/Behavioral: Negative.  The patient is not  nervous/anxious.     As per HPI. Otherwise, a complete review of systems is negative.  PAST MEDICAL HISTORY: Past Medical History:  Diagnosis Date  . Anginal pain ([place])    occas. took ntg last week  . Anxiety   . Breast cancer ([place]) 02/2017   right breast  . Complication of anesthesia   . Coronary artery disease   . Dyspnea    doe  . Family history of breast cancer   . Family history of kidney cancer   . Family history of leukemia   . Family history of lymphoma   . Fatty liver   . High cholesterol   . Hypertension   . Myocardial infarction ([place])    5/18  . Personal history of radiation therapy   . PONV (postoperative nausea and vomiting)     PAST SURGICAL HISTORY: Past Surgical History:  Procedure Laterality Date  . ABDOMINAL HYSTERECTOMY    . ACHILLES TENDON SURGERY    . BREAST BIOPSY Right 03/05/2017   Affirm Bx- invasive mammary  . BREAST LUMPECTOMY Right 03/25/2017   invasive mammary, clear margins, LN negative  . BUNIONECTOMY Bilateral   . CATARACT EXTRACTION W/PHACO Left 01/29/2017   Procedure: CATARACT EXTRACTION PHACO AND INTRAOCULAR LENS PLACEMENT (IOC);  Surgeon: Birder Robson, MD;  Location: [facility] ORS;  Service: Ophthalmology;  Laterality: Left;  [place] 00:49.9 AP% 20.3 CDE 10.11 Fluid Pack lot # [id number]  . CORONARY ANGIOPLASTY     STENT 5/18  . CORONARY STENT INTERVENTION N/A 07/18/2016   Procedure: Coronary Stent Intervention;  Surgeon: Yolonda Kida, MD;  Location: [place] CV LAB;  Service: Cardiovascular;  Laterality: N/A;  . CTR    . INTRAVASCULAR PRESSURE  WIRE/FFR STUDY N/A 07/18/2016   Procedure: Intravascular Pressure Wire/FFR Study;  Surgeon: Yolonda Kida, MD;  Location: [place] CV LAB;  Service: Cardiovascular;  Laterality: N/A;  . LEFT HEART CATH AND CORONARY ANGIOGRAPHY N/A 07/18/2016   Procedure: Left Heart Cath and Coronary Angiography;  Surgeon: Yolonda Kida, MD;  Location: [place] CV LAB;  Service:  Cardiovascular;  Laterality: N/A;  . PARTIAL MASTECTOMY WITH NEEDLE LOCALIZATION Right 03/25/2017   Procedure: PARTIAL MASTECTOMY WITH NEEDLE LOCALIZATION;  Surgeon: Herbert Pun, MD;  Location: [facility] ORS;  Service: General;  Laterality: Right;  . SENTINEL NODE BIOPSY Right 03/25/2017   Procedure: SENTINEL NODE BIOPSY;  Surgeon: Herbert Pun, MD;  Location: [facility] ORS;  Service: General;  Laterality: Right;  . TRIGGER FINGER RELEASE Bilateral     FAMILY HISTORY: Family History  Problem Relation Age of Onset  . Breast cancer Paternal Aunt        post men.   . Breast cancer Cousin   . Lymphoma Father   . Leukemia Maternal Aunt   . Cervical cancer Paternal Grandmother        d. 34s  . Breast cancer Cousin        dx 35  . Cervical cancer Cousin   . Kidney cancer Cousin     ADVANCED DIRECTIVES (Y/N):  N  HEALTH MAINTENANCE: Social History   Tobacco Use  . Smoking status: Never Smoker  . Smokeless tobacco: Never Used  Substance Use Topics  . Alcohol use: No  . Drug use: No     Colonoscopy:  PAP:  Bone density:  Lipid panel:  Allergies  Allergen Reactions  . Atorvastatin Other (See Comments)    Leg cramps.  . Metoprolol Other (See Comments)    Hypotension   . Other Diarrhea and Other (See Comments)    All mycin antibiotics   . Red Yeast Rice [Cholestin] Other (See Comments)    GI discomfort   . Statins Other (See Comments)    Myalgia, cramps     Current Outpatient Medications  Medication Sig Dispense Refill  . aspirin EC 81 MG tablet Take 81 mg by mouth daily.     . budesonide (PULMICORT) 0.25 MG/2ML nebulizer solution Take 0.25 mg by nebulization 2 (two) times daily as needed.    . Calcium Carbonate-Simethicone (ALKA-SELTZER HEARTBURN + GAS) 750-80 MG CHEW Chew 1 tablet by mouth daily as needed (heartburn).    . Calcium Carbonate-Vitamin D 600-400 MG-UNIT tablet Take 2 tablets by mouth daily.    . Camphor-Menthol-Capsicum (TIGER BALM PAIN RELIEVING)  80-24-16 MG PTCH Place 1-3 patches onto the skin daily as needed (pain).     . cholecalciferol (VITAMIN D) 400 units TABS tablet Take 400 Units by mouth at bedtime.    . clopidogrel (PLAVIX) 75 MG tablet Take 75 mg by mouth daily.    . diclofenac Sodium (VOLTAREN) 1 % GEL Apply 2 g topically 3 (three) times daily.    Marland Kitchen docusate sodium (COLACE) 100 MG capsule Take 100 mg by mouth daily as needed (for constipation).     . ezetimibe (ZETIA) 10 MG tablet Take 10 mg by mouth daily.    . ferrous sulfate 325 (65 FE) MG tablet Take 325 mg by mouth at bedtime.    . fexofenadine (ALLEGRA) 180 MG tablet Take 180 mg by mouth at bedtime as needed for allergies or rhinitis.    Marland Kitchen isosorbide mononitrate (IMDUR) 30 MG 24 hr tablet Take 30 mg by mouth daily.    Marland Kitchen  letrozole (FEMARA) 2.5 MG tablet Take 1 tablet (2.5 mg total) by mouth daily. 30 tablet 0  . montelukast (SINGULAIR) 10 MG tablet Take 10 mg by mouth daily.    . nitroGLYCERIN (NITROSTAT) 0.4 MG SL tablet Place 0.4 mg under the tongue every 5 (five) minutes as needed for chest pain.     Marland Kitchen nystatin (MYCOSTATIN/NYSTOP) powder Apply topically 2 (two) times daily. 30 g 2  . ondansetron (ZOFRAN) 8 MG tablet Take 1 tablet (8 mg total) by mouth every 8 (eight) hours as needed for nausea or vomiting. 40 tablet 0  . PARoxetine (PAXIL) 20 MG tablet Take 20 mg by mouth at bedtime.    . Polyvinyl Alcohol-Povidone PF (REFRESH) 1.4-0.6 % SOLN Place 1-2 drops into both eyes 3 (three) times daily as needed (for dry eyes.).    Marland Kitchen trolamine salicylate (ASPERCREME) 10 % cream Apply 1 application topically 4 (four) times daily as needed for muscle pain.     Marland Kitchen UNABLE TO FIND CBD Oil    . vitamin B-12 (CYANOCOBALAMIN) 1000 MCG tablet Take 1,000 mcg by mouth at bedtime.    . Vitamin D, Ergocalciferol, (DRISDOL) 1.25 MG (50000 UT) CAPS capsule Take by mouth.    . metoprolol succinate (TOPROL-XL) 25 MG 24 hr tablet Take 12.5 mg by mouth 2 (two) times daily.      No current  facility-administered medications for this visit.    OBJECTIVE: Vitals:   06/24/19 0922  BP: 126/67  Pulse: 73  Resp: 20  Temp: (!) 97.5 F (36.4 C)  SpO2: 96%     Body mass index is 39.16 kg/m.    ECOG FS:0 - Asymptomatic  General: Well-developed, well-nourished, no acute distress. Eyes: Pink conjunctiva, anicteric sclera. HEENT: Normocephalic, moist mucous membranes. Breast: Exam deferred today. Lungs: No audible wheezing or coughing. Heart: Regular rate and rhythm. Abdomen: Soft, nontender, no obvious distention. Musculoskeletal: No edema, cyanosis, or clubbing. Neuro: Alert, answering all questions appropriately. Cranial nerves grossly intact. Skin: No rashes or petechiae noted. Psych: Normal affect.  LAB RESULTS:  Lab Results  Component Value Date   NA 139 01/20/2017   K 4.5 01/20/2017   CL 104 01/20/2017   CO2 26 01/20/2017   GLUCOSE 108 (H) 01/20/2017   BUN 10 01/20/2017   CREATININE 0.63 01/20/2017   CALCIUM 9.0 01/20/2017   PROT 8.1 08/11/2011   ALBUMIN 3.8 08/11/2011   AST 24 08/11/2011   ALT 32 08/11/2011   ALKPHOS 72 08/11/2011   BILITOT 0.6 08/11/2011   GFRNONAA >60 01/20/2017   GFRAA >60 01/20/2017    Lab Results  Component Value Date   WBC 6.0 05/20/2017   NEUTROABS 6.1 01/20/2017   HGB 14.5 05/20/2017   HCT 42.1 05/20/2017   MCV 96.1 05/20/2017   PLT 243 05/20/2017     STUDIES: No results found.  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  Oncotype DX score 0.  PLAN:    1. 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  Because she had an Oncotype DX score of 0 and was low risk, she did not require adjuvant chemotherapy.  She has completed adjuvant XRT.  Despite hot flashes, patient wishes to continue letrozole and will complete 5 years  of treatment in April 2024.  Her most recent mammogram in December 2020 was reported  as BI-RADS 2, repeat in December 2021.  Return to clinic in 6 months for routine evaluation.  2.  Postmenopausal: Patient had a bone mineral density on Jul 17, 2017 that reported T score of -1.0 which is considered normal.  Repeat in May 2021. 3.  Shortness of breath/fatigue: Continue follow-up with pulmonary as indicated. 4.  Hot flashes: Mild, monitor.  Continue letrozole as above. 5.  Visual disturbance/headaches: Continue follow-up with neurology as scheduled.  Patient expressed understanding and was in agreement with this plan. She also understands that She can call clinic at any time with any questions, concerns, or complaints.   Cancer Staging Primary cancer of upper outer quadrant of right female breast [facility]) Staging form: Breast, AJCC 8th Edition - Clinical stage from 03/14/2017: Stage IA (cT1b, cN0, cM0, G2, ER: Positive, PR: Positive, HER2: Negative) - Signed by Lloyd Huger, MD on 03/15/2017   Lloyd Huger, MD   06/24/2019 10:27 AM

## 2019-06-23 ENCOUNTER — Encounter: Payer: Self-pay | Admitting: Oncology

## 2019-06-23 NOTE — Progress Notes (Signed)
Patient called for pre assessment. She denies any pain and concerns at this time.

## 2019-06-24 ENCOUNTER — Inpatient Hospital Stay: Payer: Medicare Other | Attending: Oncology | Admitting: Oncology

## 2019-06-24 ENCOUNTER — Other Ambulatory Visit: Payer: Self-pay

## 2019-06-24 ENCOUNTER — Encounter: Payer: Self-pay | Admitting: Oncology

## 2019-06-24 VITALS — BP 126/67 | HR 73 | Temp 97.5°F | Resp 20 | Wt 214.1 lb

## 2019-06-24 DIAGNOSIS — I2510 Atherosclerotic heart disease of native coronary artery without angina pectoris: Secondary | ICD-10-CM | POA: Insufficient documentation

## 2019-06-24 DIAGNOSIS — Z170 Estrogen receptor positive status [ER+]: Secondary | ICD-10-CM | POA: Diagnosis not present

## 2019-06-24 DIAGNOSIS — R519 Headache, unspecified: Secondary | ICD-10-CM | POA: Insufficient documentation

## 2019-06-24 DIAGNOSIS — F419 Anxiety disorder, unspecified: Secondary | ICD-10-CM | POA: Insufficient documentation

## 2019-06-24 DIAGNOSIS — Z79811 Long term (current) use of aromatase inhibitors: Secondary | ICD-10-CM | POA: Diagnosis not present

## 2019-06-24 DIAGNOSIS — C50411 Malignant neoplasm of upper-outer quadrant of right female breast: Secondary | ICD-10-CM | POA: Diagnosis not present

## 2019-06-24 DIAGNOSIS — Z79899 Other long term (current) drug therapy: Secondary | ICD-10-CM | POA: Insufficient documentation

## 2019-06-24 DIAGNOSIS — H539 Unspecified visual disturbance: Secondary | ICD-10-CM | POA: Diagnosis not present

## 2019-06-29 IMAGING — CT CT HEAD W/O CM
3 of 4 series · 14 of 47 positions shown, 16 images · non-contrast
Comparison: Brain MRI 07/16/2009.

CLINICAL DATA: 76-year-old female with visual hallucinations
intermittently for 1 year.

EXAM:
CT HEAD WITHOUT CONTRAST
TECHNIQUE: Contiguous axial images were obtained from the base of the skull
through the vertex without intravenous contrast.

[Series 2: head 5.00 hr40 s3 ax · axial · 0.34mm/px · z∈[-584,-464]mm · 8 of 28 slices shown, 10 images]
[im 2/28  brain]
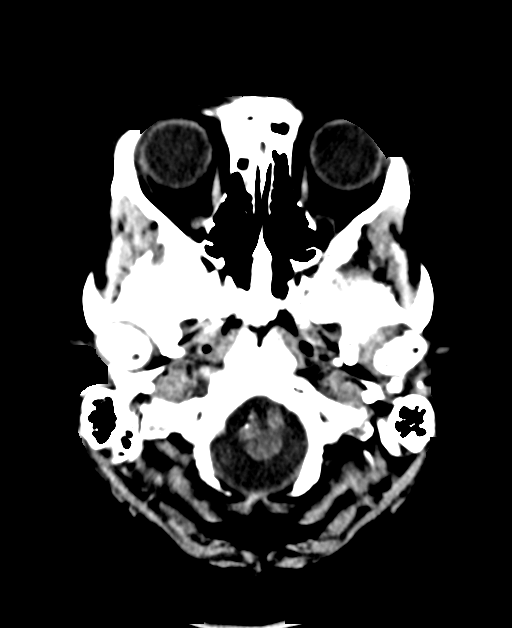
[im 2/28  bone]
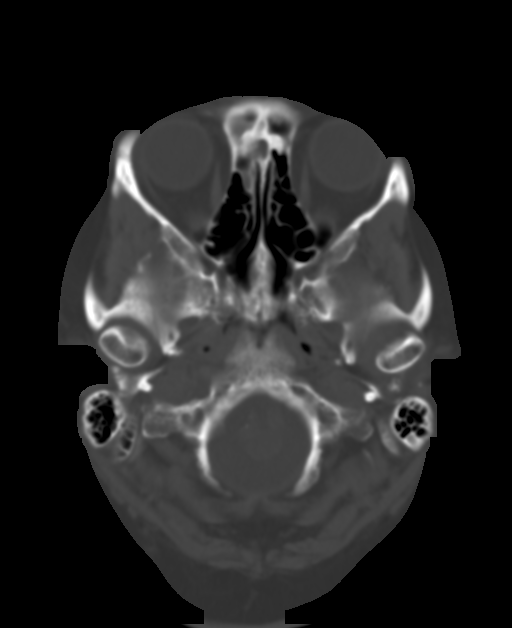
[im 6/28  brain]
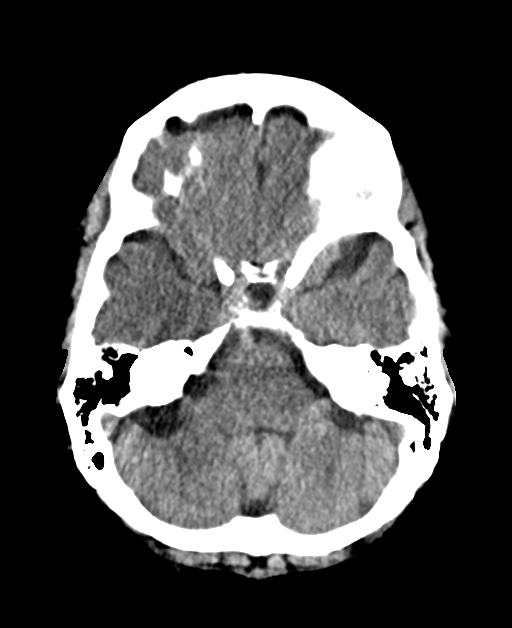
[im 10/28  brain]
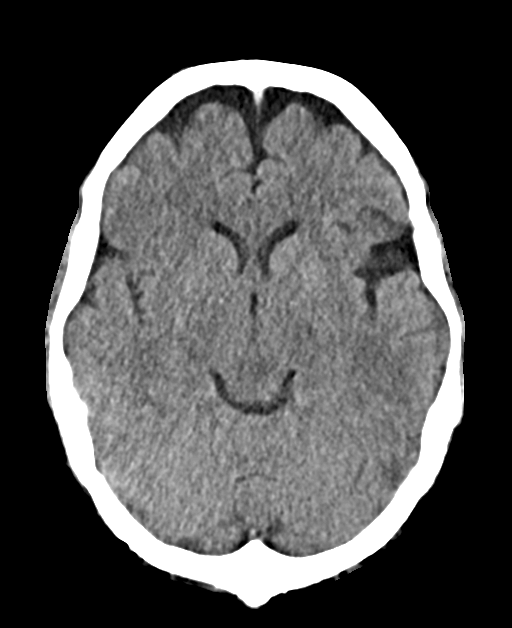
[im 12/28  brain]
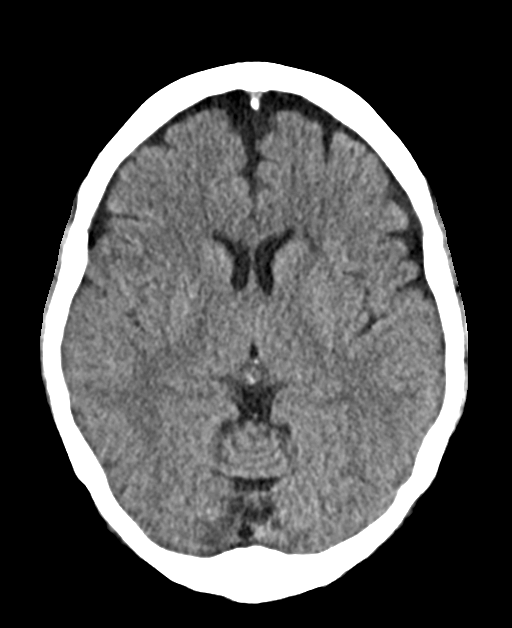
[im 16/28  brain]
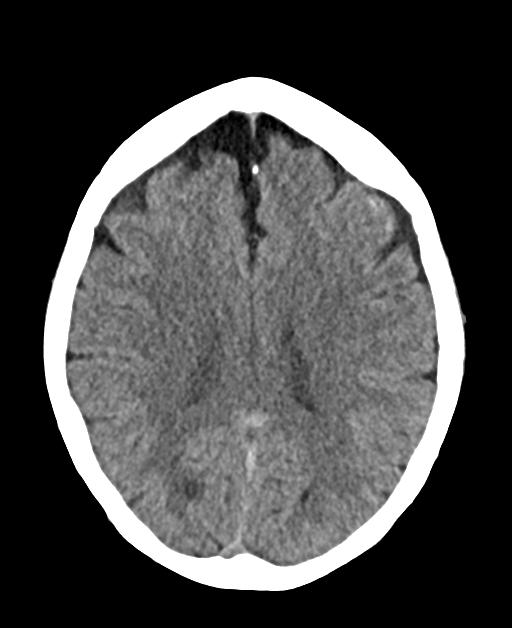
[im 16/28  bone]
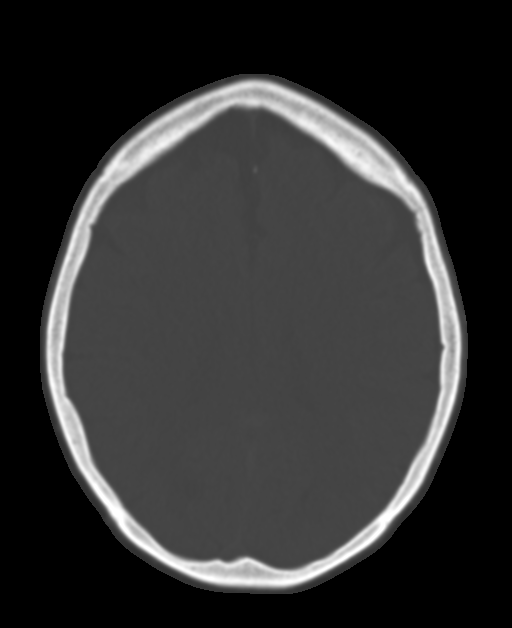
[im 18/28  brain]
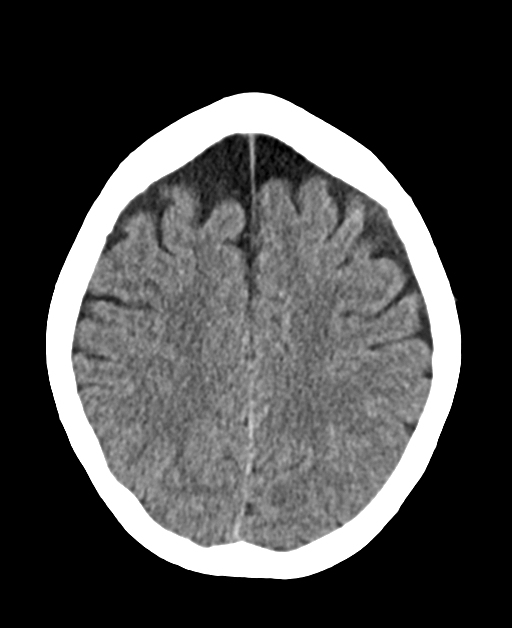
[im 22/28  brain]
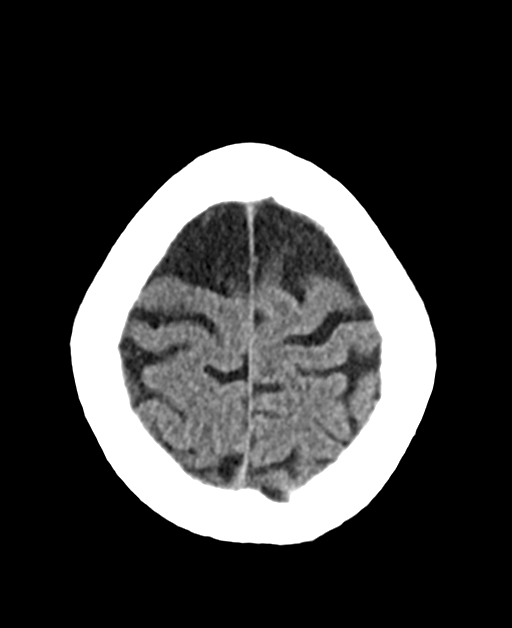
[im 26/28  brain]
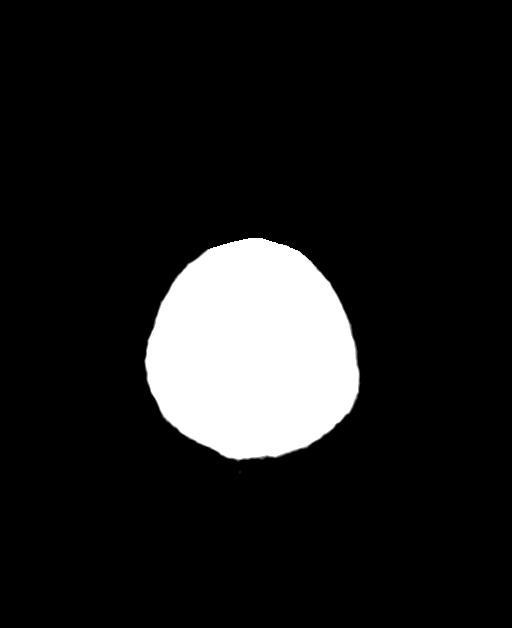

[Series 6: head 3.00 hr40 s3 cor · coronal · 0.29mm/px · 3 of 68 slices shown]
[im 23/68  brain]
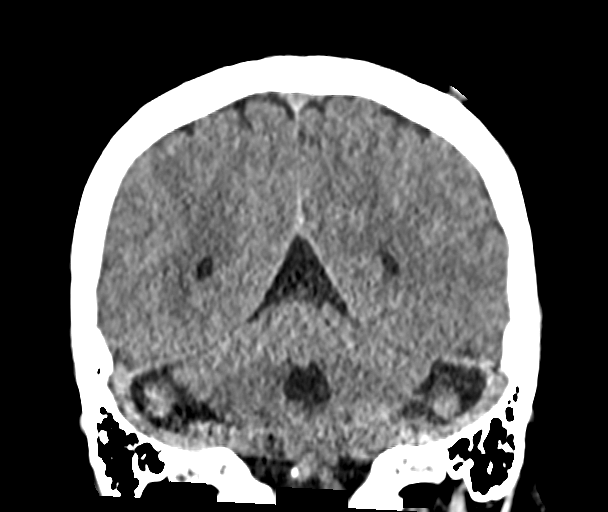
[im 30/68  brain]
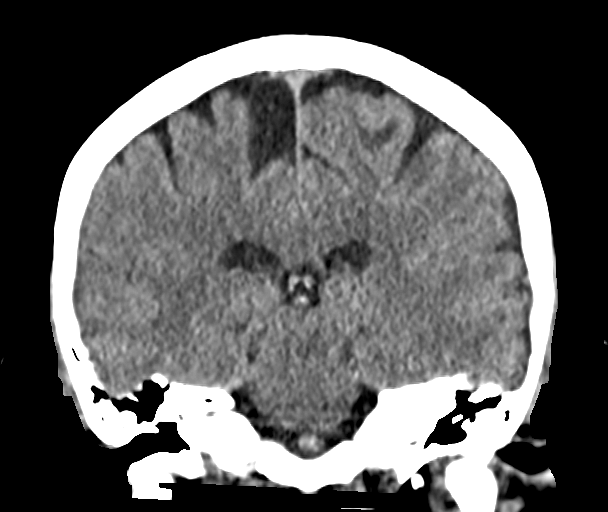
[im 38/68  brain]
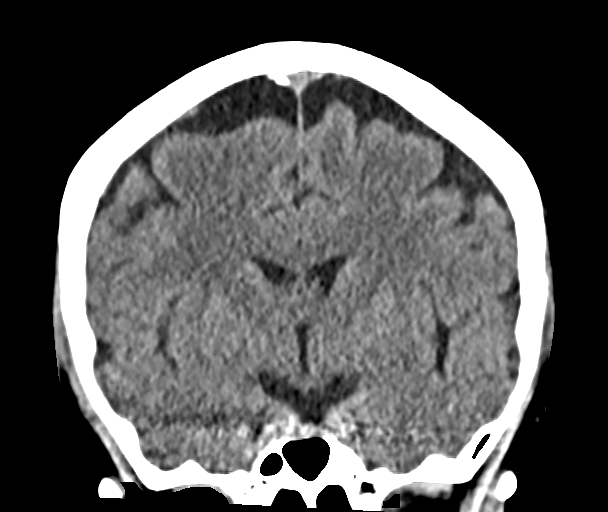

[Series 8: head 3.00 hr40 s3 sag · sagittal · 0.28mm/px · 3 of 57 slices shown]
[im 19/57  brain]
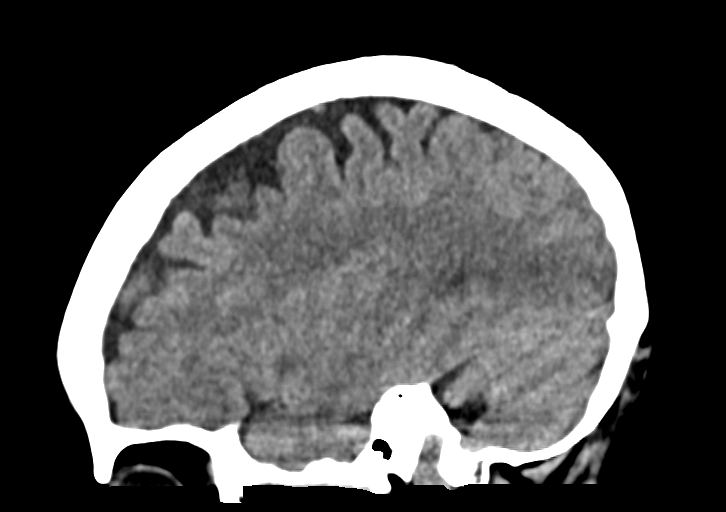
[im 29/57  brain]
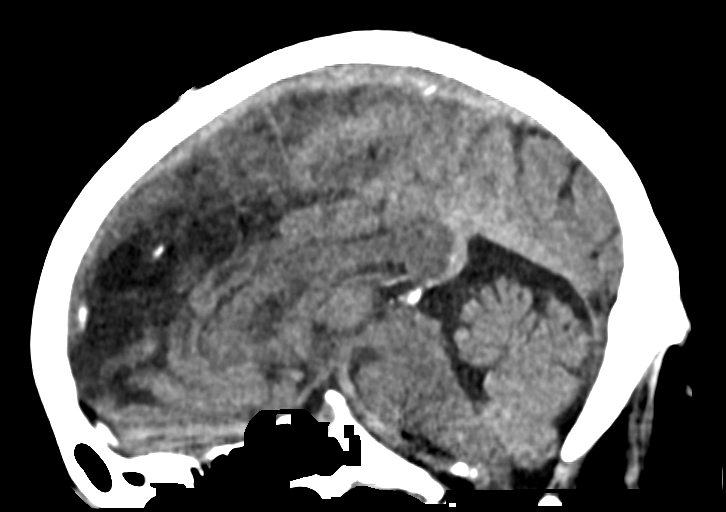
[im 38/57  brain]
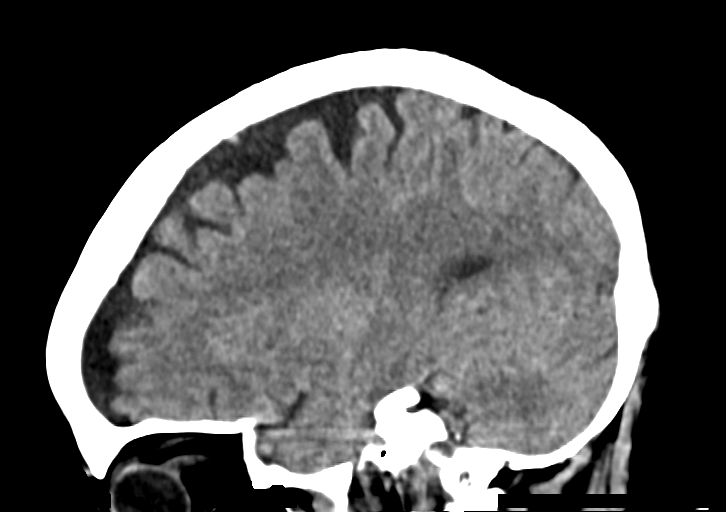

[14 of 47 positions shown; findings below may reference images not displayed]

FINDINGS: Brain: Cerebral volume is within normal limits for age. No midline
shift, ventriculomegaly, mass effect, evidence of mass lesion,
intracranial hemorrhage or evidence of cortically based acute
infarction. Gray-white matter differentiation is within normal
limits throughout the brain. No cortical encephalomalacia
identified. Bilateral occipital lobes appear normal. No suprasellar
mass or mass effect.

Vascular: Calcified atherosclerosis at the skull base. No suspicious
intracranial vascular hyperdensity.

Skull: Negative.  No acute osseous abnormality identified.

Sinuses/Orbits: Visualized paranasal sinuses and mastoids are stable
and well pneumatized.

Other: Visible orbits soft tissues appear normal.

Visualized scalp soft tissues are within normal limits.
IMPRESSION: Normal for age non contrast CT appearance of the brain. No
explanation for abnormal vision.

## 2019-07-21 ENCOUNTER — Other Ambulatory Visit: Payer: Medicare Other

## 2019-07-27 ENCOUNTER — Other Ambulatory Visit: Payer: Medicare Other

## 2019-08-03 ENCOUNTER — Other Ambulatory Visit: Payer: Self-pay | Admitting: Oncology

## 2019-08-11 ENCOUNTER — Other Ambulatory Visit: Payer: Self-pay | Admitting: Oncology

## 2019-12-27 NOTE — Progress Notes (Signed)
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  INTERVAL HISTORY: Patient returns to clinic today for routine 57-monthevaluation.  She continues to have significant hot flashes with letrozole, but otherwise feels well.  She continues to have visual disturbance and dizziness and is being evaluated by both ophthalmology and neurology.  She denies any recent fevers or illnesses.  She has a good appetite and denies weight loss.  She denies any chest pain, shortness of breath, cough, or hemoptysis.  She denies any nausea, vomiting, constipation, or diarrhea.  She has no urinary complaints.  Patient offers no further specific complaints today.    REVIEW OF SYSTEMS:   Review of Systems  Constitutional: Negative.  Negative for fever, malaise/fatigue and weight loss.  Respiratory: Negative.  Negative for cough and shortness of breath.   Cardiovascular: Negative.  Negative for chest pain and leg swelling.  Gastrointestinal: Negative.  Negative for abdominal pain and constipation.  Genitourinary: Negative.  Negative for dysuria.  Musculoskeletal: Negative.  Negative for back pain.  Skin: Negative.  Negative for rash.  Neurological: Positive for dizziness and sensory change. Negative for focal weakness, weakness and headaches.  Psychiatric/Behavioral: Negative.  The patient is not nervous/anxious.     As per HPI. Otherwise, a complete review of systems is negative.  PAST MEDICAL HISTORY: Past Medical History:  Diagnosis Date  . [place]inal pain ([place]    occas. took ntg last week  . Anxiety   . Breast cancer ([place] 02/2017   right breast   . Complication of anesthesia   . Coronary artery disease   . Dyspnea    doe  . Family history of breast cancer   . Family history of kidney cancer   . Family history of leukemia   . Family history of lymphoma   . Fatty liver   . High cholesterol   . Hypertension   . Myocardial infarction ([place]    5/18  . Personal history of radiation therapy   . PONV (postoperative nausea and vomiting)     PAST SURGICAL HISTORY: Past Surgical History:  Procedure Laterality Date  . ABDOMINAL HYSTERECTOMY    . ACHILLES TENDON SURGERY    . BREAST BIOPSY Right 03/05/2017   Affirm Bx- invasive mammary  . BREAST LUMPECTOMY Right 03/25/2017   invasive mammary, clear margins, LN negative  . BUNIONECTOMY Bilateral   . CATARACT EXTRACTION W/PHACO Left 01/29/2017   Procedure: CATARACT EXTRACTION PHACO AND INTRAOCULAR LENS PLACEMENT (IOC);  Surgeon: PBirder Robson MD;  Location: [facility] ORS;  Service: Ophthalmology;  Laterality: Left;  [place]:49.9 AP% 20.3 CDE 10.11 Fluid Pack lot # [id number] . CORONARY ANGIOPLASTY     STENT 5/18  . CORONARY STENT INTERVENTION N/A 07/18/2016   Procedure: Coronary Stent Intervention;  Surgeon: CYolonda Kida MD;  [place] [place];  Service: Cardiovascular;  Laterality: N/A;  . CTR    . INTRAVASCULAR PRESSURE WIRE/FFR STUDY N/A 07/18/2016   Procedure: Intravascular Pressure Wire/FFR Study;  Surgeon: CYolonda Kida MD;  [facility];  Service: Cardiovascular;  Laterality: N/A;  . LEFT HEART CATH AND CORONARY ANGIOGRAPHY N/A 07/18/2016   Procedure: Left Heart Cath and  Coronary Angiography;  Surgeon: Yolonda Kida, MD;  Location: [place] CV LAB;  Service: Cardiovascular;  Laterality: N/A;  . PARTIAL MASTECTOMY WITH NEEDLE LOCALIZATION Right 03/25/2017   Procedure: PARTIAL MASTECTOMY WITH NEEDLE LOCALIZATION;  Surgeon: Herbert Pun, MD;  Location: [facility] ORS;  Service: General;  Laterality: Right;  . SENTINEL NODE  BIOPSY Right 03/25/2017   Procedure: SENTINEL NODE BIOPSY;  Surgeon: Herbert Pun, MD;  Location: [facility] ORS;  Service: General;  Laterality: Right;  . TRIGGER FINGER RELEASE Bilateral     FAMILY HISTORY: Family History  Problem Relation Age of Onset  . Breast cancer Paternal Aunt        post men.   . Breast cancer Cousin   . Lymphoma Father   . Leukemia Maternal Aunt   . Cervical cancer Paternal Grandmother        d. 63s  . Breast cancer Cousin        dx 1  . Cervical cancer Cousin   . Kidney cancer Cousin     ADVANCED DIRECTIVES (Y/N):  N  HEALTH MAINTENANCE: Social History   Tobacco Use  . Smoking status: Never Smoker  . Smokeless tobacco: Never Used  Vaping Use  . Vaping Use: Never used  Substance Use Topics  . Alcohol use: No  . Drug use: No     Colonoscopy:  PAP:  Bone density:  Lipid panel:  Allergies  Allergen Reactions  . Atorvastatin Other (See Comments)    Leg cramps.  . Metoprolol Other (See Comments)    Hypotension   . Other Diarrhea and Other (See Comments)    All mycin antibiotics   . Red Yeast Rice [Cholestin] Other (See Comments)    GI discomfort   . Statins Other (See Comments)    Myalgia, cramps   . Sulfamethoxazole-Trimethoprim     Current Outpatient Medications  Medication Sig Dispense Refill  . aspirin EC 81 MG tablet Take 81 mg by mouth daily.     . budesonide (PULMICORT) 0.25 MG/2ML nebulizer solution Take 0.25 mg by nebulization 2 (two) times daily as needed.    . Calcium Carbonate-Simethicone (ALKA-SELTZER HEARTBURN + GAS) 750-80 MG CHEW Chew 1 tablet by mouth daily as needed (heartburn).    . Calcium Carbonate-Vitamin D 600-400 MG-UNIT tablet Take 2 tablets by mouth daily.    . Camphor-Menthol-Capsicum (TIGER BALM PAIN RELIEVING) 80-24-16 MG PTCH Place 1-3 patches onto the skin daily as needed (pain).     . cholecalciferol (VITAMIN D) 400 units TABS tablet Take 400 Units by mouth at bedtime.    . clopidogrel (PLAVIX) 75  MG tablet Take 75 mg by mouth daily.    . diclofenac Sodium (VOLTAREN) 1 % GEL Apply 2 g topically 3 (three) times daily.    Marland Kitchen docusate sodium (COLACE) 100 MG capsule Take 100 mg by mouth daily as needed (for constipation).     . ezetimibe (ZETIA) 10 MG tablet Take 10 mg by mouth daily.    . ferrous sulfate 325 (65 FE) MG tablet Take 325 mg by mouth at bedtime.    . fexofenadine (ALLEGRA) 180 MG tablet Take 180 mg by mouth at bedtime as needed for allergies or rhinitis.    Marland Kitchen isosorbide mononitrate (IMDUR) 30 MG 24 hr tablet Take 30 mg by mouth daily.    Marland Kitchen letrozole (FEMARA) 2.5 MG tablet Take 1 tablet (2.5 mg total) by mouth daily. 30 tablet 0  . metoprolol succinate (TOPROL-XL) 25 MG 24 hr tablet Take 12.5 mg  by mouth 2 (two) times daily.     . montelukast (SINGULAIR) 10 MG tablet Take 10 mg by mouth daily.    . nitroGLYCERIN (NITROSTAT) 0.4 MG SL tablet Place 0.4 mg under the tongue every 5 (five) minutes as needed for chest pain.     Marland Kitchen nystatin (MYCOSTATIN/NYSTOP) powder Apply topically 2 (two) times daily. 30 g 2  . ondansetron (ZOFRAN) 8 MG tablet Take 1 tablet (8 mg total) by mouth every 8 (eight) hours as needed for nausea or vomiting. 40 tablet 0  . PARoxetine (PAXIL) 20 MG tablet Take 20 mg by mouth at bedtime.    . Polyvinyl Alcohol-Povidone PF (REFRESH) 1.4-0.6 % SOLN Place 1-2 drops into both eyes 3 (three) times daily as needed (for dry eyes.).    Marland Kitchen trolamine salicylate (ASPERCREME) 10 % cream Apply 1 application topically 4 (four) times daily as needed for muscle pain.     Marland Kitchen UNABLE TO FIND CBD Oil    . vitamin B-12 (CYANOCOBALAMIN) 1000 MCG tablet Take 1,000 mcg by mouth at bedtime.    . Vitamin D, Ergocalciferol, (DRISDOL) 1.25 MG (50000 UT) CAPS capsule Take by mouth.     No current facility-administered medications for this visit.    OBJECTIVE: Vitals:   12/28/19 1030  BP: (!) 142/90  Pulse: 96  Resp: 20  Temp: 97.8 F (36.6 C)  SpO2: 96%     Body mass index is 38.37  kg/m.    ECOG FS:0 - Asymptomatic  General: Well-developed, well-nourished, no acute distress. Eyes: Pink conjunctiva, anicteric sclera. HEENT: Normocephalic, moist mucous membranes. Lungs: No audible wheezing or coughing. Heart: Regular rate and rhythm. Abdomen: Soft, nontender, no obvious distention. Musculoskeletal: No edema, cyanosis, or clubbing. Neuro: Alert, answering all questions appropriately. Cranial nerves grossly intact. Skin: No rashes or petechiae noted. Psych: Normal affect.   LAB RESULTS:  Lab Results  Component Value Date   NA 139 01/20/2017   K 4.5 01/20/2017   CL 104 01/20/2017   CO2 26 01/20/2017   GLUCOSE 108 (H) 01/20/2017   BUN 10 01/20/2017   CREATININE 0.63 01/20/2017   CALCIUM 9.0 01/20/2017   PROT 8.1 08/11/2011   ALBUMIN 3.8 08/11/2011   AST 24 08/11/2011   ALT 32 08/11/2011   ALKPHOS 72 08/11/2011   BILITOT 0.6 08/11/2011   GFRNONAA >60 01/20/2017   GFRAA >60 01/20/2017    Lab Results  Component Value Date   WBC 6.0 05/20/2017   NEUTROABS 6.1 01/20/2017   HGB 14.5 05/20/2017   HCT 42.1 05/20/2017   MCV 96.1 05/20/2017   PLT 243 05/20/2017     STUDIES: No results found.  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  Oncotype DX score 0.  PLAN:    1. 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  Because she had an Oncotype DX score of 0 and was low risk, she did not require adjuvant chemotherapy.  She has completed adjuvant XRT.  Patient has agreed to discontinue letrozole and initiate anastrozole when she completes her current prescription.  She will call clinic when she needs a refill.  She will complete 5 years of treatment in April 2024.  Her most recent mammogram on February 24, 2019 was reported as BI-RADS 2.  Repeat in January  2022.  Return to clinic in 6  months for routine evaluation.   2.  Postmenopausal: Patient had a bone mineral density on Jul 17, 2017 that reported T score of -1.0 which is considered normal.  Repeat in May 2021. 3.  Shortness of breath/fatigue: Patient does not complain of this today.  Continue follow-up with pulmonary as indicated. 4.  Hot flashes: Switched to anastrozole as above. 5.  Visual disturbance/headaches: Continue follow-up with neurology and ophthalmology as scheduled.  Patient expressed understanding and was in agreement with this plan. She also understands that She can call clinic at any time with any questions, concerns, or complaints.   Cancer Staging Primary cancer of upper outer quadrant of right female breast [facility]) Staging form: Breast, AJCC 8th Edition - Clinical stage from 03/14/2017: Stage IA (cT1b, cN0, cM0, G2, ER: Positive, PR: Positive, HER2: Negative) - Signed by Lloyd Huger, MD on 03/15/2017   Lloyd Huger, MD   12/28/2019 1:09 PM

## 2019-12-28 ENCOUNTER — Other Ambulatory Visit: Payer: Self-pay

## 2019-12-28 ENCOUNTER — Inpatient Hospital Stay: Payer: Medicare Other | Attending: Oncology | Admitting: Oncology

## 2019-12-28 ENCOUNTER — Encounter: Payer: Self-pay | Admitting: Oncology

## 2019-12-28 VITALS — BP 142/90 | HR 96 | Temp 97.8°F | Resp 20 | Wt 209.8 lb

## 2019-12-28 DIAGNOSIS — Z79811 Long term (current) use of aromatase inhibitors: Secondary | ICD-10-CM | POA: Insufficient documentation

## 2019-12-28 DIAGNOSIS — Z803 Family history of malignant neoplasm of breast: Secondary | ICD-10-CM | POA: Insufficient documentation

## 2019-12-28 DIAGNOSIS — Z79899 Other long term (current) drug therapy: Secondary | ICD-10-CM | POA: Diagnosis not present

## 2019-12-28 DIAGNOSIS — I2510 Atherosclerotic heart disease of native coronary artery without angina pectoris: Secondary | ICD-10-CM | POA: Insufficient documentation

## 2019-12-28 DIAGNOSIS — H539 Unspecified visual disturbance: Secondary | ICD-10-CM | POA: Insufficient documentation

## 2019-12-28 DIAGNOSIS — C50411 Malignant neoplasm of upper-outer quadrant of right female breast: Secondary | ICD-10-CM | POA: Insufficient documentation

## 2019-12-28 DIAGNOSIS — Z170 Estrogen receptor positive status [ER+]: Secondary | ICD-10-CM | POA: Insufficient documentation

## 2019-12-28 NOTE — Progress Notes (Signed)
Patient states she is concerned about her blood pressure. Patient also states she is having dizzy spells and headaches every night for the last two weeks. Patient also states she is having a cold sensation on left side going towards left breast with left nipple pain.

## 2020-02-04 ENCOUNTER — Other Ambulatory Visit: Payer: Self-pay | Admitting: *Deleted

## 2020-02-04 ENCOUNTER — Ambulatory Visit
Admission: RE | Admit: 2020-02-04 | Discharge: 2020-02-04 | Disposition: A | Discharge status: Home / Self Care | Payer: Medicare Other | Source: Ambulatory Visit | Attending: Radiation Oncology | Admitting: Radiation Oncology

## 2020-02-04 ENCOUNTER — Encounter: Payer: Self-pay | Admitting: Radiation Oncology

## 2020-02-04 VITALS — BP 133/65 | HR 69 | Temp 96.5°F | Wt 217.0 lb

## 2020-02-04 DIAGNOSIS — Z170 Estrogen receptor positive status [ER+]: Secondary | ICD-10-CM

## 2020-02-04 MED ORDER — ANASTROZOLE 1 MG PO TABS: 1.0000 mg | ORAL_TABLET | Freq: Every day | ORAL | 3 refills | Status: DC

## 2020-02-04 NOTE — Progress Notes (Signed)
Radiation Oncology Follow up Note  Name: Linda Yang   Date:   02/04/2020 MRN:  [id number] DOB: [birth year]    This 79 y.o. 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  REFERRING PROVIDER: Dion Body, MD  HPI: Patient is a 79 year old female now out 2-1/2 years having completed whole breast radiation to her right breast for stage I ER/PR positive invasive mammary carcinoma.  She is seen today in routine follow-up doing well specifically denies breast tenderness cough or bone pain..  She had mammograms last January to have reviewed were BI-RADS 2 benign.  I have reordered diagnostic mammograms.  She is currently on Femara tolerate it well without side effect.  COMPLICATIONS OF TREATMENT: none  FOLLOW UP COMPLIANCE: keeps appointments   PHYSICAL EXAM:  BP 133/65   Pulse 69   Temp (!) 96.5 F (35.8 C) (Tympanic)   Wt 217 lb (98.4 kg)   BMI 39.69 kg/m  Lungs are clear to A&P cardiac examination essentially unremarkable with regular rate and rhythm. No dominant mass or nodularity is noted in either breast in 2 positions examined. Incision is well-healed. No axillary or supraclavicular adenopathy is appreciated. Cosmetic result is excellent.  Well-developed well-nourished patient in NAD. HEENT reveals PERLA, EOMI, discs not visualized.  Oral cavity is clear. No oral mucosal lesions are identified. Neck is clear without evidence of cervical or supraclavicular adenopathy. Lungs are clear to A&P. Cardiac examination is essentially unremarkable with regular rate and rhythm without murmur rub or thrill. Abdomen is benign with no organomegaly or masses noted. Motor sensory and DTR levels are equal and symmetric in the upper and lower extremities. Cranial nerves II through XII are grossly intact. Proprioception is intact. No peripheral adenopathy or edema is identified. No  motor or sensory levels are noted. Crude visual fields are within normal range.  RADIOLOGY RESULTS: Mammograms reviewed diagnostic mammograms ordered  PLAN: Present time patient is doing well 2 and half years out with no evidence of disease.  I am pleased with her overall progress.  I have ordered diagnostic mammograms.  Of asked to see her back in 1 year for follow-up.  Patient knows to call sooner with any concerns.  I would like to take this opportunity to thank you for allowing me to participate in the care of your patient.Noreene Filbert, MD

## 2020-02-25 ENCOUNTER — Other Ambulatory Visit: Payer: Self-pay

## 2020-02-25 ENCOUNTER — Ambulatory Visit
Admission: RE | Admit: 2020-02-25 | Discharge: 2020-02-25 | Disposition: A | Discharge status: Home / Self Care | Payer: Medicare Other | Source: Ambulatory Visit | Attending: Oncology | Admitting: Oncology

## 2020-02-25 DIAGNOSIS — C50411 Malignant neoplasm of upper-outer quadrant of right female breast: Secondary | ICD-10-CM | POA: Diagnosis present

## 2020-05-31 ENCOUNTER — Other Ambulatory Visit: Payer: Self-pay | Admitting: Radiation Oncology

## 2020-06-07 ENCOUNTER — Other Ambulatory Visit: Payer: Self-pay | Admitting: Oncology

## 2020-07-05 ENCOUNTER — Other Ambulatory Visit: Payer: Self-pay | Admitting: *Deleted

## 2020-07-05 MED ORDER — NYSTATIN 100000 UNIT/GM EX POWD
Freq: Two times a day (BID) | CUTANEOUS | 2 refills | Status: DC
Start: 2020-07-05 — End: 2021-05-26

## 2020-07-11 ENCOUNTER — Ambulatory Visit: Payer: Medicare Other | Admitting: Oncology

## 2020-07-14 NOTE — Progress Notes (Signed)
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  INTERVAL HISTORY: Patient returns to clinic today for routine 14-monthevaluation.  Since changing to anastrozole, her hot flashes have greatly diminished and no longer affect her day-to-day activity.  She continues to have visual disturbances and dizziness and has an appointment with neurology in the near future. She denies any recent fevers or illnesses.  She has a good appetite and denies weight loss.  She denies any chest pain, shortness of breath, cough, or hemoptysis.  She denies any nausea, vomiting, constipation, or diarrhea.  She has no urinary complaints.  Patient offers no further specific complaints today.  REVIEW OF SYSTEMS:   Review of Systems  Constitutional: Negative.  Negative for fever, malaise/fatigue and weight loss.  Respiratory: Negative.  Negative for cough and shortness of breath.   Cardiovascular: Negative.  Negative for chest pain and leg swelling.  Gastrointestinal: Negative.  Negative for abdominal pain and constipation.  Genitourinary: Negative.  Negative for dysuria.  Musculoskeletal: Negative.  Negative for back pain.  Skin: Negative.  Negative for rash.  Neurological: Positive for dizziness. Negative for sensory change, focal weakness, weakness and headaches.  Psychiatric/Behavioral: Negative.  The patient is not nervous/anxious.     As per HPI. Otherwise, a complete review of systems is negative.  PAST MEDICAL HISTORY: Past Medical History:  Diagnosis Date  . Anginal pain ([place]    occas. took ntg last week  . Anxiety   . Breast cancer ([place]  02/2017   right breast  . Complication of anesthesia   . Coronary artery disease   . Dyspnea    doe  . Family history of breast cancer   . Family history of kidney cancer   . Family history of leukemia   . Family history of lymphoma   . Fatty liver   . High cholesterol   . Hypertension   . Myocardial infarction ([place]    5/18  . Personal history of radiation therapy   . PONV (postoperative nausea and vomiting)     PAST SURGICAL HISTORY: Past Surgical History:  Procedure Laterality Date  . ABDOMINAL HYSTERECTOMY    . ACHILLES TENDON SURGERY    . BREAST BIOPSY Right 03/05/2017   Affirm Bx- invasive mammary  . BREAST LUMPECTOMY Right 03/25/2017   invasive mammary, clear margins, LN negative  . BUNIONECTOMY Bilateral   . CATARACT EXTRACTION W/PHACO Left 01/29/2017   Procedure: CATARACT EXTRACTION PHACO AND INTRAOCULAR LENS PLACEMENT (IOC);  Surgeon: PBirder Robson MD;  Location: [facility] ORS;  Service: Ophthalmology;  Laterality: Left;  [place]00:49.9 AP% 20.3 CDE 10.11 Fluid Pack lot # [id number] . CORONARY ANGIOPLASTY     STENT 5/18  . CORONARY STENT INTERVENTION N/A 07/18/2016   Procedure: Coronary Stent Intervention;  Surgeon: CYolonda Kida MD;  [facility];  Service: Cardiovascular;  Laterality: N/A;  . CTR    . INTRAVASCULAR PRESSURE WIRE/FFR STUDY N/A 07/18/2016   Procedure: Intravascular Pressure Wire/FFR Study;  Surgeon: CYolonda Kida MD;  [facility];  Service: Cardiovascular;  Laterality: N/A;  . LEFT HEART CATH AND CORONARY ANGIOGRAPHY N/A 07/18/2016   Procedure: Left Heart Cath  and Coronary Angiography;  Surgeon: Yolonda Kida, MD;  Location: [facility];  Service: Cardiovascular;  Laterality: N/A;  . PARTIAL MASTECTOMY WITH NEEDLE LOCALIZATION Right 03/25/2017   Procedure: PARTIAL MASTECTOMY WITH NEEDLE LOCALIZATION;  Surgeon: Herbert Pun, MD;  Location: [facility];  Service: General;  Laterality: Right;   . SENTINEL NODE BIOPSY Right 03/25/2017   Procedure: SENTINEL NODE BIOPSY;  Surgeon: Herbert Pun, MD;  Location: [facility];  Service: General;  Laterality: Right;  . TRIGGER FINGER RELEASE Bilateral     FAMILY HISTORY: Family History  Problem Relation Age of Onset  . Breast cancer Paternal Aunt        post men.   . Breast cancer Cousin   . Lymphoma Father   . Leukemia Maternal Aunt   . Cervical cancer Paternal Grandmother        d. 81s  . Breast cancer Cousin        dx 29  . Cervical cancer Cousin   . Kidney cancer Cousin     ADVANCED DIRECTIVES (Y/N):  N  HEALTH MAINTENANCE: Social History   Tobacco Use  . Smoking status: Never Smoker  . Smokeless tobacco: Never Used  Vaping Use  . Vaping Use: Never used  Substance Use Topics  . Alcohol use: No  . Drug use: No     Colonoscopy:  PAP:  Bone density:  Lipid panel:  Allergies  Allergen Reactions  . Atorvastatin Other (See Comments)    Leg cramps.  . Metoprolol Other (See Comments)    Hypotension   . Other Diarrhea and Other (See Comments)    All mycin antibiotics   . Red Yeast Rice [Cholestin] Other (See Comments)    GI discomfort   . Statins Other (See Comments)    Myalgia, cramps   . Sulfamethoxazole-Trimethoprim     Current Outpatient Medications  Medication Sig Dispense Refill  . anastrozole (ARIMIDEX) 1 MG tablet Take 1 tablet (1 mg total) by mouth daily. 90 tablet 3  . aspirin EC 81 MG tablet Take 81 mg by mouth daily.     . budesonide (PULMICORT) 0.25 MG/2ML nebulizer solution Take 0.25 mg by nebulization 2 (two) times daily as needed.    . Calcium Carbonate-Simethicone 750-80 MG CHEW Chew 1 tablet by mouth daily as needed (heartburn).    . Calcium Carbonate-Vitamin D 600-400 MG-UNIT tablet Take 2 tablets by mouth daily.    . Camphor-Menthol-Capsicum 80-24-16 MG PTCH Place 1-3 patches onto the skin daily as needed (pain).     . cholecalciferol (VITAMIN D) 400 units TABS tablet Take 400  Units by mouth at bedtime.    . clopidogrel (PLAVIX) 75 MG tablet Take 75 mg by mouth daily.    . diclofenac Sodium (VOLTAREN) 1 % GEL Apply 2 g topically 3 (three) times daily.    Marland Kitchen docusate sodium (COLACE) 100 MG capsule Take 100 mg by mouth daily as needed (for constipation).     . fexofenadine (ALLEGRA) 180 MG tablet Take 180 mg by mouth at bedtime as needed for allergies or rhinitis.    Marland Kitchen isosorbide mononitrate (IMDUR) 30 MG 24 hr tablet Take 30 mg by mouth daily.    . montelukast (SINGULAIR) 10 MG tablet Take 10 mg by mouth daily.    . nitroGLYCERIN (NITROSTAT) 0.4 MG SL tablet Place 0.4 mg under the tongue every 5 (five) minutes as needed for chest pain.     Marland Kitchen PARoxetine (PAXIL) 20 MG tablet Take 20 mg by mouth  at bedtime.    . vitamin B-12 (CYANOCOBALAMIN) 1000 MCG tablet Take 1,000 mcg by mouth at bedtime.    . Vitamin D, Ergocalciferol, (DRISDOL) 1.25 MG (50000 UT) CAPS capsule Take by mouth.    . ezetimibe (ZETIA) 10 MG tablet Take 10 mg by mouth daily. (Patient not taking: Reported on 07/21/2020)    . ferrous sulfate 325 (65 FE) MG tablet Take 325 mg by mouth at bedtime. (Patient not taking: Reported on 07/21/2020)    . metoprolol succinate (TOPROL-XL) 25 MG 24 hr tablet Take 12.5 mg by mouth 2 (two) times daily.     Marland Kitchen nystatin (MYCOSTATIN/NYSTOP) powder Apply topically 2 (two) times daily. (Patient not taking: Reported on 07/21/2020) 30 g 0  . nystatin (MYCOSTATIN/NYSTOP) powder Apply topically 2 (two) times daily. (Patient not taking: Reported on 07/21/2020) 30 g 2  . ondansetron (ZOFRAN) 8 MG tablet Take 1 tablet (8 mg total) by mouth every 8 (eight) hours as needed for nausea or vomiting. (Patient not taking: Reported on 07/21/2020) 40 tablet 0  . Polyvinyl Alcohol-Povidone PF 1.4-0.6 % SOLN Place 1-2 drops into both eyes 3 (three) times daily as needed (for dry eyes.). (Patient not taking: Reported on 07/21/2020)    . trolamine salicylate (ASPERCREME) 10 % cream Apply 1 application topically 4  (four) times daily as needed for muscle pain.  (Patient not taking: Reported on 07/21/2020)    . UNABLE TO FIND CBD Oil (Patient not taking: Reported on 07/21/2020)     No current facility-administered medications for this visit.    OBJECTIVE: Vitals:   07/21/20 1026  BP: 119/76  Pulse: 70  Temp: 99 F (37.2 C)  SpO2: 99%     Body mass index is 39.09 kg/m.    ECOG FS:0 - Asymptomatic  General: Well-developed, well-nourished, no acute distress. Eyes: Pink conjunctiva, anicteric sclera. HEENT: Normocephalic, moist mucous membranes. Breasts: Exam deferred today. Lungs: No audible wheezing or coughing. Heart: Regular rate and rhythm. Abdomen: Soft, nontender, no obvious distention. Musculoskeletal: No edema, cyanosis, or clubbing. Neuro: Alert, answering all questions appropriately. Cranial nerves grossly intact. Skin: No rashes or petechiae noted. Psych: Normal affect.  LAB RESULTS:  Lab Results  Component Value Date   NA 139 01/20/2017   K 4.5 01/20/2017   CL 104 01/20/2017   CO2 26 01/20/2017   GLUCOSE 108 (H) 01/20/2017   BUN 10 01/20/2017   CREATININE 0.63 01/20/2017   CALCIUM 9.0 01/20/2017   PROT 8.1 08/11/2011   ALBUMIN 3.8 08/11/2011   AST 24 08/11/2011   ALT 32 08/11/2011   ALKPHOS 72 08/11/2011   BILITOT 0.6 08/11/2011   GFRNONAA >60 01/20/2017   GFRAA >60 01/20/2017    Lab Results  Component Value Date   WBC 6.0 05/20/2017   NEUTROABS 6.1 01/20/2017   HGB 14.5 05/20/2017   HCT 42.1 05/20/2017   MCV 96.1 05/20/2017   PLT 243 05/20/2017     STUDIES: DG Bone Density  Result Date: 07/19/2020 EXAM: DUAL X-RAY ABSORPTIOMETRY (DXA) FOR BONE MINERAL DENSITY IMPRESSION: Your patient Aibhlinn Kalmar completed a BMD test on 07/19/2020 using the [place] (software version: 14.10) manufactured by [employer]. The following summarizes the results of our evaluation. Technologist: [facility] PATIENT BIOGRAPHICAL: Name: Marrietta, Thunder Patient ID:  [id number] Birth Date: 1940/05/05 Height: 62.0 in. Gender: Female Exam Date: 07/19/2020 Weight: 216.5 lbs. Indications: Advanced Age, Caucasian, History of Breast Cancer, History of Fracture (Adult), History of Radiation, Hysterectomy, Postmenopausal Fractures: fingers Treatments:  Arimidex, Calcium, Vitamin D DENSITOMETRY RESULTS: Site         Region     Measured Date Measured Age WHO Classification Young Adult T-score BMD         %Change vs. Previous Significant Change (*) DualFemur Neck Left 07/19/2020 80.0 Oste[age] -1.3 0.857 g/cm2 -5.2% - DualFemur Neck Left 07/17/2017 76.0 Normal -1.0 0.904 g/cm2 - - DualFemur Total Mean 07/19/2020 79.0 Normal -0.3 0.972 g/cm2 -5.1% Yes DualFemur Total Mean 07/17/2017 76.0 Normal 0.1 1.024 g/cm2 - - Left Forearm Radius 33% 07/19/2020 79.0 Normal -0.6 0.825 g/cm2 -4.5% Yes Left Forearm Radius 33% 07/17/2017 76.0 Normal -0.1 0.864 g/cm2 - - ASSESSMENT: The BMD measured at Femur Neck Left is 0.857 g/cm2 with a T-score of -1.3. This patient is considered osteopenic according to [facility]) criteria. The scan quality is good. Lumbar spine was not utilized due to advanced degenerative changes. Compared with prior study, there has been significant decrease in BMD of the total mean hip and left forearm. World Pharmacologist [facility]) criteria for post-menopausal, Caucasian Women: Normal:                   T-score at or above -1 SD Osteopenia/low bone mass: T-score between -1 and -2.5 SD Osteoporosis:             T-score at or below -2.5 SD RECOMMENDATIONS: 1. All patients should optimize calcium and vitamin D intake. 2. Consider FDA-approved medical therapies in postmenopausal women and men aged 31 years and older, based on the following: a. A hip or vertebral(clinical or morphometric) fracture b. T-score < -2.5 at the femoral neck or spine after appropriate evaluation to exclude secondary causes c. Low bone mass (T-score between -1.0 and -2.5 at the femoral neck  or spine) and a 10-year probability of a hip fracture > 3% or a 10-year probability of a major osteoporosis-related fracture > 20% based on the [place]-adapted WHO algorithm 3. Clinician judgment and/or patient preferences may indicate treatment for people with 10-year fracture probabilities above or below these levels FOLLOW-UP: People with diagnosed cases of osteoporosis or at high risk for fracture should have regular bone mineral density tests. For patients eligible for Medicare, routine testing is allowed once every 2 years. The testing frequency can be increased to one year for patients who have rapidly progressing disease, those who are receiving or discontinuing medical therapy to restore bone mass, or have additional risk factors. FRAX* RESULTS:  (version: 3.5) 10-year Probability of Fracture1 Major Osteoporotic Fracture2 Hip Fracture 16.1% 2.9% Population: [place] (Caucasian) Risk Factors: History of Fracture (Adult) Based on Femur (Left) Neck BMD 1 -The 10-year probability of fracture may be lower than reported if the patient has received treatment. 2 -Major Osteoporotic Fracture: Clinical Spine, Forearm, Hip or Shoulder *FRAX is a Materials engineer of the [employer] for Metabolic Bone Disease, a [place] (WHO) [facility]. ASSESSMENT: The probability of a major osteoporotic fracture is 16.1% within the next ten years. The probability of a hip fracture is 2.9% within the next ten years. Electronically Signed   By: Marlaine Hind M.D.   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  Oncotype DX score 0.  PLAN:    1. 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  Because she had an Oncotype DX score of 0 and was low risk, she did not  require adjuvant chemotherapy.  She has completed adjuvant XRT.  Letrozole was discontinued secondary to hot flashes.  Patient currently takes anastrozole.  Continue treatment for total of 5 years completing in April 2024.  Her most recent mammogram on February 25, 2020 was reported as BI-RADS 2.  Repeat in January 2023.  Return to clinic in 6 months for routine evaluation. 2.  Osteopenia: Patient had bone mineral density on Jul 19, 2020 with a reported T score of -1.3.  This is slightly worse than May 2019 where her T score was reported -1.0.  Recommended calcium and vitamin D supplementation.  Repeat in June 2023. 3.  Hot flashes: Significantly improved on anastrozole. 4.  Visual disturbance/headaches: Patient reports an appointment with neurology in the near future.  Patient expressed understanding and was in agreement with this plan. She also understands that She can call clinic at any time with any questions, concerns, or complaints.   Cancer Staging Primary cancer of upper outer quadrant of right female breast [facility]) Staging form: Breast, AJCC 8th Edition - Clinical stage from 03/14/2017: Stage IA (cT1b, cN0, cM0, G2, ER: Positive, PR: Positive, HER2: Negative) - Signed by Lloyd Huger, MD on 03/15/2017 Histologic grading system: 3 grade system Laterality: Right   Lloyd Huger, MD   07/22/2020 5:40 AM

## 2020-07-19 ENCOUNTER — Other Ambulatory Visit: Payer: Self-pay

## 2020-07-19 ENCOUNTER — Ambulatory Visit
Admission: RE | Admit: 2020-07-19 | Discharge: 2020-07-19 | Disposition: A | Discharge status: Home / Self Care | Payer: Medicare Other | Source: Ambulatory Visit | Attending: Oncology | Admitting: Oncology

## 2020-07-19 DIAGNOSIS — Z923 Personal history of irradiation: Secondary | ICD-10-CM | POA: Diagnosis not present

## 2020-07-19 DIAGNOSIS — M85852 Other specified disorders of bone density and structure, left thigh: Secondary | ICD-10-CM | POA: Diagnosis not present

## 2020-07-19 DIAGNOSIS — Z13820 Encounter for screening for osteoporosis: Secondary | ICD-10-CM | POA: Diagnosis not present

## 2020-07-19 DIAGNOSIS — Z780 Asymptomatic menopausal state: Secondary | ICD-10-CM | POA: Diagnosis not present

## 2020-07-19 DIAGNOSIS — Z853 Personal history of malignant neoplasm of breast: Secondary | ICD-10-CM | POA: Diagnosis not present

## 2020-07-19 DIAGNOSIS — C50411 Malignant neoplasm of upper-outer quadrant of right female breast: Secondary | ICD-10-CM

## 2020-07-21 ENCOUNTER — Encounter: Payer: Self-pay | Admitting: Oncology

## 2020-07-21 ENCOUNTER — Inpatient Hospital Stay: Payer: Medicare Other | Attending: Oncology | Admitting: Oncology

## 2020-07-21 ENCOUNTER — Other Ambulatory Visit: Payer: Self-pay

## 2020-07-21 VITALS — BP 119/76 | HR 70 | Temp 99.0°F | Wt 213.7 lb

## 2020-07-21 DIAGNOSIS — R232 Flushing: Secondary | ICD-10-CM | POA: Insufficient documentation

## 2020-07-21 DIAGNOSIS — R519 Headache, unspecified: Secondary | ICD-10-CM | POA: Diagnosis not present

## 2020-07-21 DIAGNOSIS — C50411 Malignant neoplasm of upper-outer quadrant of right female breast: Secondary | ICD-10-CM

## 2020-07-21 DIAGNOSIS — Z170 Estrogen receptor positive status [ER+]: Secondary | ICD-10-CM | POA: Diagnosis not present

## 2020-07-21 DIAGNOSIS — H539 Unspecified visual disturbance: Secondary | ICD-10-CM | POA: Insufficient documentation

## 2020-07-21 DIAGNOSIS — Z79811 Long term (current) use of aromatase inhibitors: Secondary | ICD-10-CM | POA: Insufficient documentation

## 2020-07-21 DIAGNOSIS — M8580 Other specified disorders of bone density and structure, unspecified site: Secondary | ICD-10-CM | POA: Insufficient documentation

## 2020-07-21 NOTE — Progress Notes (Signed)
Pt is felling very fatigue .

## 2020-08-16 ENCOUNTER — Encounter: Payer: Self-pay | Admitting: Ophthalmology

## 2020-08-29 NOTE — Anesthesia Preprocedure Evaluation (Addendum)
Anesthesia Evaluation  Patient identified by MRN, date of birth, ID band Patient awake    Reviewed: Allergy & Precautions, NPO status , Patient's Chart, lab work & pertinent test results  History of Anesthesia Complications (+) PONV and history of anesthetic complications  Airway Mallampati: I   Neck ROM: Full    Dental no notable dental hx.    Pulmonary sleep apnea ,    Pulmonary exam normal breath sounds clear to auscultation       Cardiovascular hypertension, + CAD (s/p MI and stents)  Normal cardiovascular exam Rhythm:Regular Rate:Normal     Neuro/Psych  Headaches, PSYCHIATRIC DISORDERS Anxiety CVA (right eye)    GI/Hepatic GERD  ,Fatty liver   Endo/Other  negative endocrine ROS  Renal/GU      Musculoskeletal  (+) Arthritis ,   Abdominal   Peds  Hematology Obesity, breast CA   Anesthesia Other Findings   Reproductive/Obstetrics                            Anesthesia Physical Anesthesia Plan  ASA: 3  Anesthesia Plan: MAC   Post-op Pain Management:    Induction: Intravenous  PONV Risk Score and Plan: 3 and TIVA, Midazolam and Treatment may vary due to age or medical condition  Airway Management Planned: Nasal Cannula  Additional Equipment:   Intra-op Plan:   Post-operative Plan:   Informed Consent: I have reviewed the patients History and Physical, chart, labs and discussed the procedure including the risks, benefits and alternatives for the proposed anesthesia with the patient or authorized representative who has indicated his/her understanding and acceptance.       Plan Discussed with: CRNA  Anesthesia Plan Comments:        Anesthesia Quick Evaluation

## 2020-08-30 ENCOUNTER — Ambulatory Visit: Payer: Medicare Other | Admitting: Anesthesiology

## 2020-08-30 ENCOUNTER — Other Ambulatory Visit: Payer: Self-pay

## 2020-08-30 ENCOUNTER — Encounter
Admission: RE | Disposition: A | Discharge status: Home / Self Care | Payer: Self-pay | Source: Home / Self Care | Attending: Ophthalmology

## 2020-08-30 ENCOUNTER — Encounter: Payer: Self-pay | Admitting: Ophthalmology

## 2020-08-30 ENCOUNTER — Ambulatory Visit
Admission: RE | Admit: 2020-08-30 | Discharge: 2020-08-30 | Disposition: A | Discharge status: Home / Self Care | Payer: Medicare Other | Attending: Ophthalmology | Admitting: Ophthalmology

## 2020-08-30 DIAGNOSIS — Z803 Family history of malignant neoplasm of breast: Secondary | ICD-10-CM | POA: Diagnosis not present

## 2020-08-30 DIAGNOSIS — Z882 Allergy status to sulfonamides status: Secondary | ICD-10-CM | POA: Insufficient documentation

## 2020-08-30 DIAGNOSIS — Z853 Personal history of malignant neoplasm of breast: Secondary | ICD-10-CM | POA: Insufficient documentation

## 2020-08-30 DIAGNOSIS — Z884 Allergy status to anesthetic agent status: Secondary | ICD-10-CM | POA: Insufficient documentation

## 2020-08-30 DIAGNOSIS — Z7951 Long term (current) use of inhaled steroids: Secondary | ICD-10-CM | POA: Diagnosis not present

## 2020-08-30 DIAGNOSIS — Z8049 Family history of malignant neoplasm of other genital organs: Secondary | ICD-10-CM | POA: Insufficient documentation

## 2020-08-30 DIAGNOSIS — Z888 Allergy status to other drugs, medicaments and biological substances status: Secondary | ICD-10-CM | POA: Diagnosis not present

## 2020-08-30 DIAGNOSIS — I10 Essential (primary) hypertension: Secondary | ICD-10-CM | POA: Insufficient documentation

## 2020-08-30 DIAGNOSIS — Z79899 Other long term (current) drug therapy: Secondary | ICD-10-CM | POA: Diagnosis not present

## 2020-08-30 DIAGNOSIS — Z7902 Long term (current) use of antithrombotics/antiplatelets: Secondary | ICD-10-CM | POA: Diagnosis not present

## 2020-08-30 DIAGNOSIS — Z923 Personal history of irradiation: Secondary | ICD-10-CM | POA: Diagnosis not present

## 2020-08-30 DIAGNOSIS — Z7982 Long term (current) use of aspirin: Secondary | ICD-10-CM | POA: Diagnosis not present

## 2020-08-30 DIAGNOSIS — Z806 Family history of leukemia: Secondary | ICD-10-CM | POA: Insufficient documentation

## 2020-08-30 DIAGNOSIS — Z8051 Family history of malignant neoplasm of kidney: Secondary | ICD-10-CM | POA: Insufficient documentation

## 2020-08-30 DIAGNOSIS — Z807 Family history of other malignant neoplasms of lymphoid, hematopoietic and related tissues: Secondary | ICD-10-CM | POA: Diagnosis not present

## 2020-08-30 DIAGNOSIS — H2511 Age-related nuclear cataract, right eye: Secondary | ICD-10-CM | POA: Insufficient documentation

## 2020-08-30 HISTORY — DX: Sleep apnea, unspecified: G47.30

## 2020-08-30 HISTORY — DX: Unspecified osteoarthritis, unspecified site: M19.90

## 2020-08-30 HISTORY — DX: Gastro-esophageal reflux disease without esophagitis: K21.9

## 2020-08-30 HISTORY — DX: Headache, unspecified: R51.9

## 2020-08-30 HISTORY — DX: Unspecified macular degeneration: H35.30

## 2020-08-30 HISTORY — PX: CATARACT EXTRACTION W/PHACO: SHX586

## 2020-08-30 HISTORY — DX: Allergy, unspecified, initial encounter: T78.40XA

## 2020-08-30 SURGERY — PHACOEMULSIFICATION, CATARACT, WITH IOL INSERTION
Anesthesia: Monitor Anesthesia Care | Site: Eye | Laterality: Right

## 2020-08-30 MED ORDER — LIDOCAINE HCL (PF) 2 % IJ SOLN
INTRAOCULAR | Status: DC | PRN
  Administered 2020-08-30: 2 mL

## 2020-08-30 MED ORDER — CYCLOPENTOLATE HCL 2 % OP SOLN
1.0000 [drp] | OPHTHALMIC | Status: DC
  Administered 2020-08-30 (×3): 1 [drp] via OPHTHALMIC

## 2020-08-30 MED ORDER — SIGHTPATH DOSE#1 NA CHONDROIT SULF-NA HYALURON 40-17 MG/ML IO SOLN
INTRAOCULAR | Status: DC | PRN
  Administered 2020-08-30: 1 mL via INTRAOCULAR

## 2020-08-30 MED ORDER — BRIMONIDINE TARTRATE-TIMOLOL 0.2-0.5 % OP SOLN
OPHTHALMIC | Status: DC | PRN
  Administered 2020-08-30: 1 [drp] via OPHTHALMIC

## 2020-08-30 MED ORDER — ACETAMINOPHEN 325 MG PO TABS: 650.0000 mg | ORAL_TABLET | Freq: Once | ORAL | Status: DC | PRN

## 2020-08-30 MED ORDER — MOXIFLOXACIN HCL 0.5 % OP SOLN
OPHTHALMIC | Status: DC | PRN
  Administered 2020-08-30: 0.2 mL via OPHTHALMIC

## 2020-08-30 MED ORDER — TETRACAINE HCL 0.5 % OP SOLN
1.0000 [drp] | OPHTHALMIC | Status: DC | PRN
  Administered 2020-08-30 (×3): 1 [drp] via OPHTHALMIC

## 2020-08-30 MED ORDER — SIGHTPATH DOSE#1 BSS IO SOLN
INTRAOCULAR | Status: DC | PRN
  Administered 2020-08-30: 58 mL via OPHTHALMIC

## 2020-08-30 MED ORDER — ONDANSETRON HCL 4 MG/2ML IJ SOLN: 4.0000 mg | Freq: Once | INTRAMUSCULAR | Status: DC | PRN

## 2020-08-30 MED ORDER — LACTATED RINGERS IV SOLN: INTRAVENOUS | Status: DC

## 2020-08-30 MED ORDER — FENTANYL CITRATE (PF) 100 MCG/2ML IJ SOLN
INTRAMUSCULAR | Status: DC | PRN
  Administered 2020-08-30: 50 ug via INTRAVENOUS

## 2020-08-30 MED ORDER — ACETAMINOPHEN 160 MG/5ML PO SOLN: 325.0000 mg | ORAL | Status: DC | PRN

## 2020-08-30 MED ORDER — MIDAZOLAM HCL 2 MG/2ML IJ SOLN
INTRAMUSCULAR | Status: DC | PRN
  Administered 2020-08-30 (×2): 1 mg via INTRAVENOUS

## 2020-08-30 MED ORDER — DEXAMETHASONE 0.4 MG OP INST
VAGINAL_INSERT | OPHTHALMIC | Status: DC | PRN
Start: 2020-08-30 — End: 2020-08-30
  Administered 2020-08-30: 0.4 mg via OPHTHALMIC

## 2020-08-30 MED ORDER — PHENYLEPHRINE HCL 10 % OP SOLN
1.0000 [drp] | OPHTHALMIC | Status: AC
  Administered 2020-08-30 (×3): 1 [drp] via OPHTHALMIC

## 2020-08-30 SURGICAL SUPPLY — 16 items
CANNULA ANT/CHMB 27G (MISCELLANEOUS) ×2 IMPLANT
CANNULA ANT/CHMB 27GA (MISCELLANEOUS) ×4 IMPLANT
GLOVE SURG ENC TEXT LTX SZ8 (GLOVE) ×2 IMPLANT
GLOVE SURG TRIUMPH 8.0 PF LTX (GLOVE) ×2 IMPLANT
GOWN STRL REUS W/ TWL LRG LVL3 (GOWN DISPOSABLE) ×2 IMPLANT
GOWN STRL REUS W/TWL LRG LVL3 (GOWN DISPOSABLE) ×4
LENS IOL TECNIS EYHANCE 20.5 (Intraocular Lens) ×1 IMPLANT
MARKER SKIN DUAL TIP RULER LAB (MISCELLANEOUS) ×2 IMPLANT
NDL FILTER BLUNT 18X1 1/2 (NEEDLE) ×1 IMPLANT
NEEDLE FILTER BLUNT 18X 1/2SAF (NEEDLE) ×1
NEEDLE FILTER BLUNT 18X1 1/2 (NEEDLE) ×1 IMPLANT
PACK EYE AFTER SURG (MISCELLANEOUS) ×2 IMPLANT
SYR 3ML LL SCALE MARK (SYRINGE) ×2 IMPLANT
SYR TB 1ML LUER SLIP (SYRINGE) ×2 IMPLANT
WATER STERILE IRR 250ML POUR (IV SOLUTION) ×2 IMPLANT
WIPE NON LINTING 3.25X3.25 (MISCELLANEOUS) ×2 IMPLANT

## 2020-08-30 NOTE — H&P (Signed)
[place]   Primary Care Physician:  Dion Body, MD Ophthalmologist: Dr.Lashun Mccants  Pre-Procedure History & Physical: HPI:  Linda Yang is a 80 y.o. female here for cataract surgery.   Past Medical History:  Diagnosis Date   Allergies    Anginal pain ([place])    occas. took ntg last week   Anxiety    Arthritis    Breast cancer ([place]) 02/2017   right breast   Complication of anesthesia    Coronary artery disease    Dyspnea    doe   Family history of breast cancer    Family history of kidney cancer    Family history of leukemia    Family history of lymphoma    Fatty liver    GERD (gastroesophageal reflux disease)    Headache    High cholesterol    Hypertension    Macular degeneration    Myocardial infarction ([place])    5/18   Personal history of radiation therapy    PONV (postoperative nausea and vomiting)    Sleep apnea    no CPAP    Past Surgical History:  Procedure Laterality Date   ABDOMINAL HYSTERECTOMY     ACHILLES TENDON SURGERY     BREAST BIOPSY Right 03/05/2017   Affirm Bx- invasive mammary   BREAST LUMPECTOMY Right 03/25/2017   invasive mammary, clear margins, LN negative   BUNIONECTOMY Bilateral    CATARACT EXTRACTION W/PHACO Left 01/29/2017   Procedure: CATARACT EXTRACTION PHACO AND INTRAOCULAR LENS PLACEMENT ([place]);  Surgeon: Birder Robson, MD;  Location: [facility];  Service: Ophthalmology;  Laterality: Left;  [place] 00:49.9 AP% 20.3 CDE 10.11 Fluid Pack lot # [id number]   CORONARY ANGIOPLASTY     STENT 5/18   CORONARY STENT INTERVENTION N/A 07/18/2016   Procedure: Coronary Stent Intervention;  Surgeon: Yolonda Kida, MD;  Location: [facility];  Service: Cardiovascular;  Laterality: N/A;   CTR     INTRAVASCULAR PRESSURE WIRE/FFR STUDY N/A 07/18/2016   Procedure: Intravascular Pressure Wire/FFR Study;  Surgeon: Yolonda Kida, MD;  Location: [facility];  Service: Cardiovascular;  Laterality: N/A;   LEFT HEART CATH  AND CORONARY ANGIOGRAPHY N/A 07/18/2016   Procedure: Left Heart Cath and Coronary Angiography;  Surgeon: Yolonda Kida, MD;  Location: [facility];  Service: Cardiovascular;  Laterality: N/A;   PARTIAL MASTECTOMY WITH NEEDLE LOCALIZATION Right 03/25/2017   Procedure: PARTIAL MASTECTOMY WITH NEEDLE LOCALIZATION;  Surgeon: Herbert Pun, MD;  Location: [facility];  Service: General;  Laterality: Right;   SENTINEL NODE BIOPSY Right 03/25/2017   Procedure: SENTINEL NODE BIOPSY;  Surgeon: Herbert Pun, MD;  Location: [facility];  Service: General;  Laterality: Right;   TRIGGER FINGER RELEASE Bilateral     Prior to Admission medications   Medication Sig Start Date End Date Taking? Authorizing Provider  anastrozole (ARIMIDEX) 1 MG tablet Take 1 tablet (1 mg total) by mouth daily. 06/07/20  Yes Lloyd Huger, MD  aspirin EC 81 MG tablet Take 81 mg by mouth daily.    Yes [provider]  budesonide (PULMICORT) 0.25 MG/2ML nebulizer solution Take 0.25 mg by nebulization 2 (two) times daily as needed.   Yes [provider]  Calcium Carbonate-Simethicone 750-80 MG CHEW Chew 1 tablet by mouth daily as needed (heartburn).   Yes [provider]  Calcium Carbonate-Vitamin D 600-400 MG-UNIT tablet Take 2 tablets by mouth daily.   Yes [provider]  Camphor-Menthol-Capsicum 80-24-16 MG [place]  Place 1-3 patches onto the skin daily as needed (pain).    Yes [provider]  cholecalciferol (VITAMIN D) 400 units TABS tablet Take 400 Units by mouth at bedtime.   Yes [provider]  clopidogrel (PLAVIX) 75 MG tablet Take 75 mg by mouth daily.   Yes [provider]  fexofenadine (ALLEGRA) 180 MG tablet Take 180 mg by mouth at bedtime as needed for allergies or rhinitis.   Yes [provider]  isosorbide mononitrate (IMDUR) 30 MG 24 hr tablet Take 30 mg by mouth daily.   Yes [provider]  METAMUCIL FIBER PO  Take by mouth as needed.   Yes [provider]  metoprolol succinate (TOPROL-XL) 25 MG 24 hr tablet Take 12.5 mg by mouth 2 (two) times daily.  08/02/16 08/30/20 Yes [provider]  Multiple Vitamins-Minerals (ICAPS AREDS 2 PO) Take by mouth 2 (two) times daily.   Yes [provider]  nitroGLYCERIN (NITROSTAT) 0.4 MG SL tablet Place 0.4 mg under the tongue every 5 (five) minutes as needed for chest pain.  07/06/16  Yes [provider]  PARoxetine (PAXIL) 20 MG tablet Take 20 mg by mouth at bedtime.   Yes [provider]  vitamin B-12 (CYANOCOBALAMIN) 1000 MCG tablet Take 1,000 mcg by mouth at bedtime.   Yes [provider]  Vitamin D, Ergocalciferol, (DRISDOL) 1.25 MG (50000 UT) CAPS capsule Take by mouth. 12/29/18  Yes [provider]  diclofenac Sodium (VOLTAREN) 1 % GEL Apply 2 g topically 3 (three) times daily. Patient not taking: Reported on 08/16/2020 02/24/19   [provider]  docusate sodium (COLACE) 100 MG capsule Take 100 mg by mouth daily as needed (for constipation).  Patient not taking: Reported on 08/16/2020    [provider]  ezetimibe (ZETIA) 10 MG tablet Take 10 mg by mouth daily. Patient not taking: Reported on 07/21/2020    [provider]  ferrous sulfate 325 (65 FE) MG tablet Take 325 mg by mouth at bedtime. Patient not taking: Reported on 07/21/2020    [provider]  montelukast (SINGULAIR) 10 MG tablet Take 10 mg by mouth daily. Patient not taking: Reported on 08/16/2020 06/04/19   [provider]  nystatin (MYCOSTATIN/NYSTOP) powder Apply topically 2 (two) times daily. Patient not taking: Reported on 07/21/2020 07/07/20   Noreene Filbert, MD  nystatin (MYCOSTATIN/NYSTOP) powder Apply topically 2 (two) times daily. Patient not taking: Reported on 07/21/2020 07/05/20   Noreene Filbert, MD  ondansetron (ZOFRAN) 8 MG tablet Take 1 tablet (8 mg total) by mouth every 8 (eight) hours as  needed for nausea or vomiting. Patient not taking: Reported on 07/21/2020 05/31/17   Jacquelin Hawking, NP  Polyvinyl Alcohol-Povidone PF 1.4-0.6 % SOLN Place 1-2 drops into both eyes 3 (three) times daily as needed (for dry eyes.). Patient not taking: Reported on 07/21/2020    [provider]  trolamine salicylate (ASPERCREME) 10 % cream Apply 1 application topically 4 (four) times daily as needed for muscle pain.  Patient not taking: Reported on 07/21/2020    [provider]  UNABLE TO FIND CBD Oil Patient not taking: Reported on 07/21/2020    [provider]    Allergies as of 07/27/2020 - Review Complete 07/21/2020  Allergen Reaction Noted   Atorvastatin Other (See Comments) 12/17/2017   Metoprolol Other (See Comments) 07/11/2016   Other Diarrhea and Other (See Comments) 07/11/2016   Red yeast rice [cholestin] Other (See Comments) 07/11/2016  Statins Other (See Comments) 07/11/2016   Sulfamethoxazole-trimethoprim  12/28/2019    Family History  Problem Relation Age of Onset   Breast cancer Paternal Aunt        post men.    Breast cancer Cousin    Lymphoma Father    Leukemia Maternal Aunt    Cervical cancer Paternal Grandmother        d. 3s   Breast cancer Cousin        dx 42   Cervical cancer Cousin    Kidney cancer Cousin     Social History   Socioeconomic History   Marital status: Married    Spouse name: Not on file   Number of children: Not on file   Years of education: Not on file   Highest education level: Not on file  Occupational History   Not on file  Tobacco Use   Smoking status: Never   Smokeless tobacco: Never  Vaping Use   Vaping Use: Never used  Substance and Sexual Activity   Alcohol use: No   Drug use: No   Sexual activity: Not on file  Other Topics Concern   Not on file  Social History Narrative   Not on file   Social Determinants of Health   Financial Resource Strain: Not on file  Food Insecurity: Not on file   Transportation Needs: Not on file  Physical Activity: Not on file  Stress: Not on file  Social Connections: Not on file  Intimate Partner Violence: Not on file    Review of Systems: See HPI, otherwise negative ROS  Physical Exam: BP (!) 143/74   Pulse 70   Temp (!) 97.3 F (36.3 C) (Temporal)   Resp 16   Ht 5\' 2"  (1.575 m)   Wt 106.1 kg   SpO2 95%   BMI 42.80 kg/m  General:   Alert, cooperative in NAD Head:  Normocephalic and atraumatic. Respiratory:  Normal work of breathing. Cardiovascular:  RRR  Impression/Plan: Linda Yang is here for cataract surgery.  Risks, benefits, limitations, and alternatives regarding cataract surgery have been reviewed with the patient.  Questions have been answered.  All parties agreeable.   Birder Robson, MD  08/30/2020, 9:22 AM

## 2020-08-30 NOTE — Transfer of Care (Signed)
Immediate Anesthesia Transfer of Care Note  Patient: Linda Yang  Procedure(s) Performed: CATARACT EXTRACTION PHACO AND INTRAOCULAR LENS PLACEMENT ([facility]) RIGHT 11.15 01:09.0 (Right: Eye)  Patient Location: PACU  Anesthesia Type: MAC  Level of Consciousness: awake, alert  and patient cooperative  Airway and Oxygen Therapy: Patient Spontanous Breathing and Patient connected to supplemental oxygen  Post-op Assessment: Post-op Vital signs reviewed, Patient's Cardiovascular Status Stable, Respiratory Function Stable, Patent Airway and No signs of Nausea or vomiting  Post-op Vital Signs: Reviewed and stable  Complications: No notable events documented.

## 2020-08-30 NOTE — Op Note (Signed)
PREOPERATIVE DIAGNOSIS:  Nuclear sclerotic cataract of the right eye.   POSTOPERATIVE DIAGNOSIS:  Cataract   OPERATIVE PROCEDURE:ORPROCALL@   SURGEON:  Birder Robson, MD.   ANESTHESIA:  Anesthesiologist: Darrin Nipper, MD CRNA: Silvana Newness, CRNA  1.      Managed anesthesia care. 2.      0.67ml of Shugarcaine was instilled in the eye following the paracentesis.   COMPLICATIONS:  None.   TECHNIQUE:   Stop and chop   DESCRIPTION OF PROCEDURE:  The patient was examined and consented in the preoperative holding area where the aforementioned topical anesthesia was applied to the right eye and then brought back to the Operating Room where the right eye was prepped and draped in the usual sterile ophthalmic fashion and a lid speculum was placed. A paracentesis was created with the side port blade and the anterior chamber was filled with viscoelastic. A near clear corneal incision was performed with the steel keratome. A continuous curvilinear capsulorrhexis was performed with a cystotome followed by the capsulorrhexis forceps. Hydrodissection and hydrodelineation were carried out with BSS on a blunt cannula. The lens was removed in a stop and chop  technique and the remaining cortical material was removed with the irrigation-aspiration handpiece. The capsular bag was inflated with viscoelastic and the Technis [id number]  lens was placed in the capsular bag without complication. The remaining viscoelastic was removed from the eye with the irrigation-aspiration handpiece. The wounds were hydrated. The anterior chamber was flushed with BSS and the eye was inflated to physiologic pressure. 0.56ml of Vigamox was placed in the anterior chamber. The wounds were found to be water tight. The eye was dressed with Combigan. The patient was given protective glasses to wear throughout the day and a shield with which to sleep tonight. The patient was also given drops with which to begin a drop regimen today and will  follow-up with me in one day. Implant Name Type Inv. Item Serial No. Manufacturer Lot No. LRB No. Used Action  LENS IOL TECNIS EYHANCE 20.5 - [id number] Intraocular Lens LENS IOL TECNIS EYHANCE 20.5 [id number] JOHNSON   Right 1 Implanted   Procedure(s) with comments: CATARACT EXTRACTION PHACO AND INTRAOCULAR LENS PLACEMENT ([facility]) RIGHT 11.15 01:09.0 (Right) - [id number]  Electronically signed: Birder Robson 08/30/2020 9:49 AM

## 2020-08-30 NOTE — Anesthesia Postprocedure Evaluation (Signed)
Anesthesia Post Note  Patient: Linda Yang  Procedure(s) Performed: CATARACT EXTRACTION PHACO AND INTRAOCULAR LENS PLACEMENT ([facility]) RIGHT 11.15 01:09.0 (Right: Eye)     Patient location during evaluation: PACU Anesthesia Type: MAC Level of consciousness: awake and alert, oriented and patient cooperative Pain management: pain level controlled Vital Signs Assessment: post-procedure vital signs reviewed and stable Respiratory status: spontaneous breathing, nonlabored ventilation and respiratory function stable Cardiovascular status: blood pressure returned to baseline and stable Postop Assessment: adequate PO intake Anesthetic complications: no   No notable events documented.  Darrin Nipper

## 2020-09-02 ENCOUNTER — Encounter: Payer: Self-pay | Admitting: Ophthalmology

## 2020-10-11 ENCOUNTER — Other Ambulatory Visit: Payer: Self-pay

## 2020-10-11 ENCOUNTER — Emergency Department: Payer: Medicare Other

## 2020-10-11 ENCOUNTER — Emergency Department
Admission: EM | Admit: 2020-10-11 | Discharge: 2020-10-11 | Disposition: A | Discharge status: Home / Self Care | Payer: Medicare Other | Attending: Emergency Medicine | Admitting: Emergency Medicine

## 2020-10-11 DIAGNOSIS — I2510 Atherosclerotic heart disease of native coronary artery without angina pectoris: Secondary | ICD-10-CM | POA: Diagnosis not present

## 2020-10-11 DIAGNOSIS — S0003XA Contusion of scalp, initial encounter: Secondary | ICD-10-CM | POA: Diagnosis not present

## 2020-10-11 DIAGNOSIS — W19XXXA Unspecified fall, initial encounter: Secondary | ICD-10-CM

## 2020-10-11 DIAGNOSIS — Z853 Personal history of malignant neoplasm of breast: Secondary | ICD-10-CM | POA: Insufficient documentation

## 2020-10-11 DIAGNOSIS — S5002XA Contusion of left elbow, initial encounter: Secondary | ICD-10-CM

## 2020-10-11 DIAGNOSIS — I10 Essential (primary) hypertension: Secondary | ICD-10-CM | POA: Insufficient documentation

## 2020-10-11 DIAGNOSIS — S0990XA Unspecified injury of head, initial encounter: Secondary | ICD-10-CM | POA: Diagnosis present

## 2020-10-11 DIAGNOSIS — Z7902 Long term (current) use of antithrombotics/antiplatelets: Secondary | ICD-10-CM | POA: Diagnosis not present

## 2020-10-11 DIAGNOSIS — W01198A Fall on same level from slipping, tripping and stumbling with subsequent striking against other object, initial encounter: Secondary | ICD-10-CM | POA: Insufficient documentation

## 2020-10-11 DIAGNOSIS — Z7982 Long term (current) use of aspirin: Secondary | ICD-10-CM | POA: Diagnosis not present

## 2020-10-11 DIAGNOSIS — Z79899 Other long term (current) drug therapy: Secondary | ICD-10-CM | POA: Insufficient documentation

## 2020-10-11 LAB — CBC WITH DIFFERENTIAL/PLATELET
Abs Immature Granulocytes: 0.03 10*3/uL (ref 0.00–0.07)
Basophils Absolute: 0 10*3/uL (ref 0.0–0.1)
Basophils Relative: 0 %
Eosinophils Absolute: 0.1 10*3/uL (ref 0.0–0.5)
Eosinophils Relative: 1 %
HCT: 40.4 % (ref 36.0–46.0)
Hemoglobin: 13.8 g/dL (ref 12.0–15.0)
Immature Granulocytes: 0 %
Lymphocytes Relative: 28 %
Lymphs Abs: 2.3 10*3/uL (ref 0.7–4.0)
MCH: 33 pg (ref 26.0–34.0)
MCHC: 34.2 g/dL (ref 30.0–36.0)
MCV: 96.7 fL (ref 80.0–100.0)
Monocytes Absolute: 0.8 10*3/uL (ref 0.1–1.0)
Monocytes Relative: 10 %
Neutro Abs: 4.8 10*3/uL (ref 1.7–7.7)
Neutrophils Relative %: 61 %
Platelets: 289 10*3/uL (ref 150–400)
RBC: 4.18 MIL/uL (ref 3.87–5.11)
RDW: 12.5 % (ref 11.5–15.5)
WBC: 8.1 10*3/uL (ref 4.0–10.5)
nRBC: 0 % (ref 0.0–0.2)

## 2020-10-11 LAB — BASIC METABOLIC PANEL
Anion gap: 8 (ref 5–15)
BUN: 17 mg/dL (ref 8–23)
CO2: 29 mmol/L (ref 22–32)
Calcium: 9.1 mg/dL (ref 8.9–10.3)
Chloride: 101 mmol/L (ref 98–111)
Creatinine, Ser: 0.64 mg/dL (ref 0.44–1.00)
GFR, Estimated: >60 mL/min (ref >60)
Glucose, Bld: 118 mg/dL — ABNORMAL HIGH (ref 70–99)
Potassium: 3.8 mmol/L (ref 3.5–5.1)
Sodium: 138 mmol/L (ref 135–145)

## 2020-10-11 LAB — PROTIME-INR
INR: 1 (ref 0.8–1.2)
Prothrombin Time: 12.9 seconds (ref 11.4–15.2)

## 2020-10-11 MED ORDER — MELOXICAM 7.5 MG PO TABS: 7.5000 mg | ORAL_TABLET | Freq: Every day | ORAL | 0 refills | Status: DC

## 2020-10-11 MED ORDER — METHOCARBAMOL 500 MG PO TABS: 500.0000 mg | ORAL_TABLET | Freq: Four times a day (QID) | ORAL | 0 refills | Status: DC

## 2020-10-11 NOTE — ED Provider Notes (Signed)
[facility] Emergency Department Provider Note  ____________________________________________  Time seen: Approximately 8:50 PM  I have reviewed the triage vital signs and the nursing notes.   HISTORY  Chief Complaint Fall    HPI Linda Yang is a 80 y.o. female who presents the emergency department complaining of headache, a "knot" on the top of her head, and left elbow pain and ecchymosis after a mechanical fall.  Patient states that she was picking some things when she stepped backwards, her foot got tangled in an unknown object causing her to trip and fall.  She did fall backwards and hit her head.  No loss of consciousness.  She does endorse a slight headache but states that it is not severe enough to even need Tylenol or Motrin for.  She had no subsequent loss of consciousness.  No other concerning symptoms of blurred vision, double vision, radicular symptoms.  No neck pain at this time.  Patient did hit her elbow and has some bruising and pain to the elbow but is still able to move it appropriately with no loss of range of motion.  There is no pain in the shoulder or wrist.  Sensation intact.       Past Medical History:  Diagnosis Date   Allergies    Anginal pain ([place])    occas. took ntg last week   Anxiety    Arthritis    Breast cancer ([place]) 02/2017   right breast   Complication of anesthesia    Coronary artery disease    Dyspnea    doe   Family history of breast cancer    Family history of kidney cancer    Family history of leukemia    Family history of lymphoma    Fatty liver    GERD (gastroesophageal reflux disease)    Headache    High cholesterol    Hypertension    Macular degeneration    Myocardial infarction ([place])    5/18   Personal history of radiation therapy    PONV (postoperative nausea and vomiting)    Sleep apnea    no CPAP    Patient Active Problem List   Diagnosis Date Noted   Genetic testing 12/31/2018   Family  history of breast cancer    Family history of lymphoma    Family history of leukemia    Family history of kidney cancer    Mild persistent asthma without complication [id number]   B12 deficiency 02/10/2018   Mixed hyperlipidemia 02/10/2018   Non morbid obesity due to excess calories 02/10/2018   Primary cancer of upper outer quadrant of right female breast ([place]) 03/15/2017   History of right breast cancer 03/14/2017   Coronary artery disease involving native coronary artery of native heart 07/26/2016   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 07/19/2016   Vitamin D deficiency 03/22/2016   Osteopenia of multiple sites 02/17/2016   Anxiety state 01/19/2015   DDD (degenerative disc disease), lumbar 08/10/2013   Lumbar radiculitis 08/10/2013   De Quervain's tenosynovitis, left 08/21/2012   Low back pain radiating to right leg 07/24/2012   CVA 07/08/2009   GOUT, UNSPECIFIED 07/06/2009   HYPERTENSION 07/06/2009   PALPITATIONS 07/06/2009    Past Surgical History:  Procedure Laterality Date   ABDOMINAL HYSTERECTOMY     ACHILLES TENDON SURGERY     BREAST BIOPSY Right 03/05/2017   Affirm Bx- invasive mammary   BREAST LUMPECTOMY Right 03/25/2017   invasive mammary, clear  margins, LN negative   BUNIONECTOMY Bilateral    CATARACT EXTRACTION W/PHACO Left 01/29/2017   Procedure: CATARACT EXTRACTION PHACO AND INTRAOCULAR LENS PLACEMENT ([place]);  Surgeon: Birder Robson, MD;  Location: [facility] ORS;  Service: Ophthalmology;  Laterality: Left;  [place] 00:49.9 AP% 20.3 CDE 10.11 Fluid Pack lot # [id number]   CATARACT EXTRACTION W/PHACO Right 08/30/2020   Procedure: CATARACT EXTRACTION PHACO AND INTRAOCULAR LENS PLACEMENT ([facility]) RIGHT 11.15 01:09.0;  Surgeon: Birder Robson, MD;  Location: [place];  Service: Ophthalmology;  Laterality: Right;  [place] ANGIOPLASTY     STENT 5/18   CORONARY STENT INTERVENTION N/A 07/18/2016   Procedure: Coronary Stent  Intervention;  Surgeon: Yolonda Kida, MD;  Location: [place] [facility];  Service: Cardiovascular;  Laterality: N/A;   CTR     INTRAVASCULAR PRESSURE WIRE/FFR STUDY N/A 07/18/2016   Procedure: Intravascular Pressure Wire/FFR Study;  Surgeon: Yolonda Kida, MD;  Location: [place] [facility];  Service: Cardiovascular;  Laterality: N/A;   LEFT HEART CATH AND CORONARY ANGIOGRAPHY N/A 07/18/2016   Procedure: Left Heart Cath and Coronary Angiography;  Surgeon: Yolonda Kida, MD;  Location: [place] [facility];  Service: Cardiovascular;  Laterality: N/A;   PARTIAL MASTECTOMY WITH NEEDLE LOCALIZATION Right 03/25/2017   Procedure: PARTIAL MASTECTOMY WITH NEEDLE LOCALIZATION;  Surgeon: Herbert Pun, MD;  Location: [facility];  Service: General;  Laterality: Right;   SENTINEL NODE BIOPSY Right 03/25/2017   Procedure: SENTINEL NODE BIOPSY;  Surgeon: Herbert Pun, MD;  Location: [facility];  Service: General;  Laterality: Right;   TRIGGER FINGER RELEASE Bilateral     Prior to Admission medications   Medication Sig Start Date End Date Taking? Authorizing Provider  meloxicam (MOBIC) 7.5 MG tablet Take 1 tablet (7.5 mg total) by mouth daily. 10/11/20 10/11/21 Yes Nathalie Cavendish, Charline Bills, PA-C  methocarbamol (ROBAXIN) 500 MG tablet Take 1 tablet (500 mg total) by mouth 4 (four) times daily. 10/11/20  Yes Miarose Lippert, Charline Bills, PA-C  anastrozole (ARIMIDEX) 1 MG tablet Take 1 tablet (1 mg total) by mouth daily. 06/07/20   Lloyd Huger, MD  aspirin EC 81 MG tablet Take 81 mg by mouth daily.     [provider]  budesonide (PULMICORT) 0.25 MG/2ML nebulizer solution Take 0.25 mg by nebulization 2 (two) times daily as needed.    [provider]  Calcium Carbonate-Simethicone 750-80 MG CHEW Chew 1 tablet by mouth daily as needed (heartburn).    [provider]  Calcium Carbonate-Vitamin D 600-400 MG-UNIT tablet Take 2 tablets by mouth daily.    [provider]  Camphor-Menthol-Capsicum 80-24-16 MG [place] 1-3 patches onto the skin daily as needed (pain).     [provider]  cholecalciferol (VITAMIN D) 400 units TABS tablet Take 400 Units by mouth at bedtime.    [provider]  clopidogrel (PLAVIX) 75 MG tablet Take 75 mg by mouth daily.    [provider]  diclofenac Sodium (VOLTAREN) 1 % GEL Apply 2 g topically 3 (three) times daily. Patient not taking: Reported on 08/16/2020 02/24/19   [provider]  docusate sodium (COLACE) 100 MG capsule Take 100 mg by mouth daily as needed (for constipation).  Patient not taking: Reported on 08/16/2020    [provider]  ezetimibe (ZETIA) 10 MG tablet Take 10 mg by mouth daily. Patient not taking: Reported on 07/21/2020    [provider]  ferrous sulfate 325 (65 FE) MG tablet Take 325 mg  by mouth at bedtime. Patient not taking: Reported on 07/21/2020    [provider]  fexofenadine (ALLEGRA) 180 MG tablet Take 180 mg by mouth at bedtime as needed for allergies or rhinitis.    [provider]  isosorbide mononitrate (IMDUR) 30 MG 24 hr tablet Take 30 mg by mouth daily.    [provider]  METAMUCIL FIBER PO Take by mouth as needed.    [provider]  metoprolol succinate (TOPROL-XL) 25 MG 24 hr tablet Take 12.5 mg by mouth 2 (two) times daily.  08/02/16 08/30/20  [provider]  montelukast (SINGULAIR) 10 MG tablet Take 10 mg by mouth daily. Patient not taking: Reported on 08/16/2020 06/04/19   [provider]  Multiple Vitamins-Minerals (ICAPS AREDS 2 PO) Take by mouth 2 (two) times daily.    [provider]  nitroGLYCERIN (NITROSTAT) 0.4 MG SL tablet Place 0.4 mg under the tongue every 5 (five) minutes as needed for chest pain.  07/06/16   [provider]  nystatin (MYCOSTATIN/NYSTOP) powder Apply topically 2 (two) times daily. Patient not taking: Reported on 07/21/2020  07/07/20   Noreene Filbert, MD  nystatin (MYCOSTATIN/NYSTOP) powder Apply topically 2 (two) times daily. Patient not taking: Reported on 07/21/2020 07/05/20   Noreene Filbert, MD  ondansetron (ZOFRAN) 8 MG tablet Take 1 tablet (8 mg total) by mouth every 8 (eight) hours as needed for nausea or vomiting. Patient not taking: Reported on 07/21/2020 05/31/17   Jacquelin Hawking, NP  PARoxetine (PAXIL) 20 MG tablet Take 20 mg by mouth at bedtime.    [provider]  Polyvinyl Alcohol-Povidone PF 1.4-0.6 % SOLN Place 1-2 drops into both eyes 3 (three) times daily as needed (for dry eyes.). Patient not taking: Reported on 07/21/2020    [provider]  trolamine salicylate (ASPERCREME) 10 % cream Apply 1 application topically 4 (four) times daily as needed for muscle pain.  Patient not taking: Reported on 07/21/2020    [provider]  UNABLE TO FIND CBD Oil Patient not taking: Reported on 07/21/2020    [provider]  vitamin B-12 (CYANOCOBALAMIN) 1000 MCG tablet Take 1,000 mcg by mouth at bedtime.    [provider]  Vitamin D, Ergocalciferol, (DRISDOL) 1.25 MG (50000 UT) CAPS capsule Take by mouth. 12/29/18   [provider]    Allergies Atorvastatin, Metoprolol, Other, Red yeast rice [cholestin], Statins, and Sulfamethoxazole-trimethoprim  Family History  Problem Relation Age of Onset   Breast cancer Paternal Aunt        post men.    Breast cancer Cousin    Lymphoma Father    Leukemia Maternal Aunt    Cervical cancer Paternal Grandmother        d. 68s   Breast cancer Cousin        dx 34   Cervical cancer Cousin    Kidney cancer Cousin     Social History Social History   Tobacco Use   Smoking status: Never   Smokeless tobacco: Never  Vaping Use   Vaping Use: Never used  Substance Use Topics   Alcohol use: No   Drug use: No     Review of Systems  Constitutional: No fever/chills.  Patient fell and hit her head.  No loss of  consciousness. Eyes: No visual changes. No discharge ENT: No upper respiratory complaints. Cardiovascular: no chest pain. Respiratory: no cough. No SOB. Gastrointestinal: No abdominal pain.  No nausea, no vomiting.  No diarrhea.  No  constipation. Musculoskeletal: Positive for left elbow pain/injury. Skin: Negative for rash, abrasions, lacerations, ecchymosis. Neurological: Positive for posttraumatic headache but denies focal weakness or numbness.  10 System ROS otherwise negative.  ____________________________________________   PHYSICAL EXAM:  VITAL SIGNS: ED Triage Vitals  Enc Vitals Group     BP 10/11/20 1825 (!) 145/82     Pulse Rate 10/11/20 1825 80     Resp 10/11/20 1825 17     Temp 10/11/20 1825 98.6 F (37 C)     Temp Source 10/11/20 1825 Oral     SpO2 10/11/20 1825 95 %     Weight 10/11/20 1831 219 lb (99.3 kg)     Height 10/11/20 1831 '5\' 2"'$  (1.575 m)     Head Circumference --      Peak Flow --      Pain Score 10/11/20 1831 2     Pain Loc --      Pain Edu? --      Excl. in [place]? --      Constitutional: Alert and oriented. Well appearing and in no acute distress. Eyes: Conjunctivae are normal. PERRL. EOMI. Head: Patient has a small hematoma at the superior aspect of the occipital skull.  Mild tenderness to the area but no palpable crepitus.  No other visible signs of trauma to the skull or face.  No battle signs, raccoon eyes, serosanguineous fluid drainage from the ears or nares. ENT:      Ears:       Nose: No congestion/rhinnorhea.      Mouth/Throat: Mucous membranes are moist.  Neck: No stridor.  No midline or bilateral cervical spine tenderness to palpation.  Radial pulse and sensation intact and equal bilateral upper extremities. Cardiovascular: Normal rate, regular rhythm. Normal S1 and S2.  Good peripheral circulation. Respiratory: Normal respiratory effort without tachypnea or retractions. Lungs CTAB. Good air entry to the bases with no decreased or absent  breath sounds. Gastrointestinal: Bowel sounds 4 quadrants. Soft and nontender to palpation. No guarding or rigidity. No palpable masses. No distention. No CVA tenderness. Musculoskeletal: Full range of motion to all extremities. No gross deformities appreciated.  Visualization of the left elbow reveals a hematoma along the posterior lateral aspect.  No deformity.  Full range of motion. Neurologic:  Normal speech and language. No gross focal neurologic deficits are appreciated.  Skin:  Skin is warm, dry and intact. No rash noted. Psychiatric: Mood and affect are normal. Speech and behavior are normal. Patient exhibits appropriate insight and judgement.   ____________________________________________   LABS (all labs ordered are listed, but only abnormal results are displayed)  Labs Reviewed  BASIC METABOLIC PANEL - Abnormal; Notable for the following components:      Result Value   Glucose, Bld 118 (*)    All other components within normal limits  CBC WITH DIFFERENTIAL/PLATELET  PROTIME-INR   ____________________________________________  EKG   ____________________________________________  RADIOLOGY I personally viewed and evaluated these images as part of my medical decision making, as well as reviewing the written report by the radiologist.  ED Provider Interpretation: No acute traumatic findings on imaging  DG Elbow Complete Left  Result Date: 10/11/2020 CLINICAL DATA:  Pain, hematoma, fall striking LEFT elbow EXAM: LEFT ELBOW - COMPLETE 3+ VIEW COMPARISON:  None FINDINGS: Osseous mineralization normal. Joint spaces preserved. No acute fracture, dislocation, or bone destruction. No elbow joint effusion. Scattered soft tissue swelling/contusion. IMPRESSION: No acute osseous abnormalities. Electronically Signed   By: Crist Infante.D.  On: 10/11/2020 19:05   CT HEAD WO CONTRAST (5MM)  Result Date: 10/11/2020 CLINICAL DATA:  [place], hit head, anticoagulated EXAM: CT HEAD WITHOUT  CONTRAST TECHNIQUE: Contiguous axial images were obtained from the base of the skull through the vertex without intravenous contrast. COMPARISON:  03/08/2017 FINDINGS: Brain: No acute infarct or hemorrhage. Lateral ventricles and midline structures are unremarkable. No acute extra-axial fluid collections. No mass effect. Vascular: No hyperdense vessel or unexpected calcification. Skull: Normal. Negative for fracture or focal lesion. Sinuses/Orbits: Chronic retained secretions within the right anterior ethmoid air cells and right frontal sinus. Remaining sinuses are clear. Other: None. IMPRESSION: 1. No acute intracranial process.  Stable exam. Electronically Signed   By: Randa Ngo M.D.   On: 10/11/2020 18:52   CT Cervical Spine Wo Contrast  Result Date: 10/11/2020 CLINICAL DATA:  [place], hit head, anticoagulated EXAM: CT CERVICAL SPINE WITHOUT CONTRAST TECHNIQUE: Multidetector CT imaging of the cervical spine was performed without intravenous contrast. Multiplanar CT image reconstructions were also generated. COMPARISON:  None. FINDINGS: Alignment: Alignment is anatomic. Skull base and vertebrae: No acute fracture. No primary bone lesion or focal pathologic process. Soft tissues and spinal canal: No prevertebral fluid or swelling. No visible canal hematoma. Disc levels: Mild spondylosis at C5-6 and C6-7. There is diffuse facet hypertrophy greatest from C2 through C5. Upper chest: Airway is patent.  Lung apices are clear. Other: Reconstructed images demonstrate no additional findings. IMPRESSION: 1. Multilevel cervical degenerative changes.  No acute fracture. Electronically Signed   By: Randa Ngo M.D.   On: 10/11/2020 18:55    ____________________________________________    PROCEDURES  Procedure(s) performed:    Procedures    Medications - No data to display   ____________________________________________   INITIAL IMPRESSION / ASSESSMENT AND PLAN / ED COURSE  Pertinent labs &  imaging results that were available during my care of the patient were reviewed by me and considered in my medical decision making (see chart for details).  Review of the [place] CSRS was performed in accordance of the [place] prior to dispensing any controlled drugs.           Patient's diagnosis is consistent with fall, contusion of the head and contusion of the elbow.  Patient presented to the emergency department after sustaining a mechanical fall.  She did hit her head, did not lose consciousness.  Imaging is reassuring at this time.  Exam is reassuring with patient being neurologically intact.  No indication for further work-up.  Symptom control medications at home..  Return precautions discussed with the patient.  Follow-up with primary care as needed.  Patient is given ED precautions to return to the ED for any worsening or new symptoms.     ____________________________________________  FINAL CLINICAL IMPRESSION(S) / ED DIAGNOSES  Final diagnoses:  Fall, initial encounter  Contusion of left elbow, initial encounter  Contusion of scalp, initial encounter      NEW MEDICATIONS STARTED DURING THIS VISIT:  ED Discharge Orders          Ordered    meloxicam (MOBIC) 7.5 MG tablet  Daily        10/11/20 2111    methocarbamol (ROBAXIN) 500 MG tablet  4 times daily        10/11/20 2111                This chart was dictated using voice recognition software/Dragon. Despite best efforts to proofread, errors can occur which can change the meaning. Any change was purely  unintentional.    Brynda Peon 10/11/20 2111    Vanessa Level Green, MD 10/12/20 1122

## 2020-10-11 NOTE — ED Triage Notes (Signed)
Pt reports that she was picking [place] and fell. She felt her legs getting tangled up and fell back hitting her head, she is on blood thinners, she did not have any LOC. She also hit her left lower arm. There is a large hematoma on it.

## 2020-10-13 ENCOUNTER — Other Ambulatory Visit: Payer: Self-pay | Admitting: Oncology

## 2020-10-27 ENCOUNTER — Encounter: Payer: Self-pay | Admitting: *Deleted

## 2020-11-12 ENCOUNTER — Emergency Department
Admission: EM | Admit: 2020-11-12 | Discharge: 2020-11-13 | Disposition: A | Discharge status: Home / Self Care | Payer: Medicare Other | Attending: Emergency Medicine | Admitting: Emergency Medicine

## 2020-11-12 ENCOUNTER — Encounter: Payer: Self-pay | Admitting: *Deleted

## 2020-11-12 ENCOUNTER — Other Ambulatory Visit: Payer: Self-pay

## 2020-11-12 ENCOUNTER — Emergency Department: Payer: Medicare Other

## 2020-11-12 DIAGNOSIS — Z79899 Other long term (current) drug therapy: Secondary | ICD-10-CM | POA: Diagnosis not present

## 2020-11-12 DIAGNOSIS — R519 Headache, unspecified: Secondary | ICD-10-CM | POA: Insufficient documentation

## 2020-11-12 DIAGNOSIS — Z7982 Long term (current) use of aspirin: Secondary | ICD-10-CM | POA: Insufficient documentation

## 2020-11-12 DIAGNOSIS — K219 Gastro-esophageal reflux disease without esophagitis: Secondary | ICD-10-CM | POA: Insufficient documentation

## 2020-11-12 DIAGNOSIS — M542 Cervicalgia: Secondary | ICD-10-CM | POA: Insufficient documentation

## 2020-11-12 DIAGNOSIS — I10 Essential (primary) hypertension: Secondary | ICD-10-CM | POA: Diagnosis not present

## 2020-11-12 DIAGNOSIS — M25561 Pain in right knee: Secondary | ICD-10-CM | POA: Diagnosis not present

## 2020-11-12 DIAGNOSIS — Z7951 Long term (current) use of inhaled steroids: Secondary | ICD-10-CM | POA: Diagnosis not present

## 2020-11-12 DIAGNOSIS — R102 Pelvic and perineal pain: Secondary | ICD-10-CM | POA: Diagnosis not present

## 2020-11-12 DIAGNOSIS — Z7902 Long term (current) use of antithrombotics/antiplatelets: Secondary | ICD-10-CM | POA: Insufficient documentation

## 2020-11-12 DIAGNOSIS — I2510 Atherosclerotic heart disease of native coronary artery without angina pectoris: Secondary | ICD-10-CM | POA: Diagnosis not present

## 2020-11-12 DIAGNOSIS — J4530 Mild persistent asthma, uncomplicated: Secondary | ICD-10-CM | POA: Diagnosis not present

## 2020-11-12 DIAGNOSIS — Z853 Personal history of malignant neoplasm of breast: Secondary | ICD-10-CM | POA: Insufficient documentation

## 2020-11-12 DIAGNOSIS — Z951 Presence of aortocoronary bypass graft: Secondary | ICD-10-CM | POA: Insufficient documentation

## 2020-11-12 DIAGNOSIS — W19XXXA Unspecified fall, initial encounter: Secondary | ICD-10-CM

## 2020-11-12 MED ORDER — ACETAMINOPHEN 500 MG PO TABS
1000.0000 mg | ORAL_TABLET | Freq: Once | ORAL | Status: AC
  Administered 2020-11-13: 1000 mg via ORAL
  Filled 2020-11-12: qty 2

## 2020-11-12 NOTE — ED Triage Notes (Signed)
Pt reports she stubbed her toe and fell to the ground, hit her head, reports a knot on the right side of her head. Pt is on plavix. No LOC. Some neck pain. Ambulatory, steady gait. MAE x4.

## 2020-11-13 ENCOUNTER — Emergency Department: Payer: Medicare Other

## 2020-11-13 NOTE — ED Provider Notes (Signed)
[facility] Emergency Department Provider Note  ____________________________________________  Time seen: Approximately 1:27 AM  I have reviewed the triage vital signs and the nursing notes.   HISTORY  Chief Complaint Fall   HPI Linda Yang is a 80 y.o. female with several comorbidities as listed below who presents for evaluation after mechanical fall.  Patient reports that she was picking up flowers in the garden and walking back home when she stubbed her toe and fell.  She reports hitting her head on the floor.  She is on Plavix.  No LOC.  She denies back pain.  She is also complaining of mild HA, mild neck discomfort with ROM, bilateral pelvis pain, and right knee pain.  Her pain is more of a discomfort, no severe pain, she was able to ambulate after the fall.  Abdominal pain, chest pain or shortness of breath  Past Medical History:  Diagnosis Date   Allergies    Anginal pain ([place])    occas. took ntg last week   Anxiety    Arthritis    Breast cancer ([place]) 02/2017   right breast   Complication of anesthesia    Coronary artery disease    Dyspnea    doe   Family history of breast cancer    Family history of kidney cancer    Family history of leukemia    Family history of lymphoma    Fatty liver    GERD (gastroesophageal reflux disease)    Headache    High cholesterol    Hypertension    Macular degeneration    Myocardial infarction ([place])    5/18   Personal history of radiation therapy    PONV (postoperative nausea and vomiting)    Sleep apnea    no CPAP    Patient Active Problem List   Diagnosis Date Noted   Genetic testing 12/31/2018   Family history of breast cancer    Family history of lymphoma    Family history of leukemia    Family history of kidney cancer    Mild persistent asthma without complication 76/19/5093   B12 deficiency 02/10/2018   Mixed hyperlipidemia 02/10/2018   Non morbid obesity due to excess calories 02/10/2018    Primary cancer of upper outer quadrant of right female breast ([place]) 03/15/2017   History of right breast cancer 03/14/2017   Coronary artery disease involving native coronary artery of native heart 07/26/2016   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 07/19/2016   Vitamin D deficiency 03/22/2016   Osteopenia of multiple sites 02/17/2016   Anxiety state 01/19/2015   DDD (degenerative disc disease), lumbar 08/10/2013   Lumbar radiculitis 08/10/2013   De Quervain's tenosynovitis, left 08/21/2012   Low back pain radiating to right leg 07/24/2012   CVA 07/08/2009   GOUT, UNSPECIFIED 07/06/2009   HYPERTENSION 07/06/2009   PALPITATIONS 07/06/2009    Past Surgical History:  Procedure Laterality Date   ABDOMINAL HYSTERECTOMY     ACHILLES TENDON SURGERY     BREAST BIOPSY Right 03/05/2017   Affirm Bx- invasive mammary   BREAST LUMPECTOMY Right 03/25/2017   invasive mammary, clear margins, LN negative   BUNIONECTOMY Bilateral    CATARACT EXTRACTION W/PHACO Left 01/29/2017   Procedure: CATARACT EXTRACTION PHACO AND INTRAOCULAR LENS PLACEMENT ([place]);  Surgeon: Birder Robson, MD;  Location: [facility];  Service: Ophthalmology;  Laterality: Left;  [place] 00:49.9 AP% 20.3 CDE 10.11 Fluid Pack lot # [id number]   CATARACT EXTRACTION W/PHACO  Right 08/30/2020   Procedure: CATARACT EXTRACTION PHACO AND INTRAOCULAR LENS PLACEMENT ([facility]) RIGHT 11.15 01:09.0;  Surgeon: Birder Robson, MD;  Location: [place];  Service: Ophthalmology;  Laterality: Right;  [place] ANGIOPLASTY     STENT 5/18   CORONARY STENT INTERVENTION N/A 07/18/2016   Procedure: Coronary Stent Intervention;  Surgeon: Yolonda Kida, MD;  Location: [place] [facility];  Service: Cardiovascular;  Laterality: N/A;   CTR     INTRAVASCULAR PRESSURE WIRE/FFR STUDY N/A 07/18/2016   Procedure: Intravascular Pressure Wire/FFR Study;  Surgeon: Yolonda Kida, MD;  Location: [place] [facility];   Service: Cardiovascular;  Laterality: N/A;   LEFT HEART CATH AND CORONARY ANGIOGRAPHY N/A 07/18/2016   Procedure: Left Heart Cath and Coronary Angiography;  Surgeon: Yolonda Kida, MD;  Location: [place] [facility];  Service: Cardiovascular;  Laterality: N/A;   PARTIAL MASTECTOMY WITH NEEDLE LOCALIZATION Right 03/25/2017   Procedure: PARTIAL MASTECTOMY WITH NEEDLE LOCALIZATION;  Surgeon: Herbert Pun, MD;  Location: [facility];  Service: General;  Laterality: Right;   SENTINEL NODE BIOPSY Right 03/25/2017   Procedure: SENTINEL NODE BIOPSY;  Surgeon: Herbert Pun, MD;  Location: [facility];  Service: General;  Laterality: Right;   TRIGGER FINGER RELEASE Bilateral     Prior to Admission medications   Medication Sig Start Date End Date Taking? Authorizing Provider  anastrozole (ARIMIDEX) 1 MG tablet Take 1 tablet (1 mg total) by mouth daily. 10/13/20   Lloyd Huger, MD  aspirin EC 81 MG tablet Take 81 mg by mouth daily.     [provider]  budesonide (PULMICORT) 0.25 MG/2ML nebulizer solution Take 0.25 mg by nebulization 2 (two) times daily as needed.    [provider]  Calcium Carbonate-Simethicone 750-80 MG CHEW Chew 1 tablet by mouth daily as needed (heartburn).    [provider]  Calcium Carbonate-Vitamin D 600-400 MG-UNIT tablet Take 2 tablets by mouth daily.    [provider]  Camphor-Menthol-Capsicum 80-24-16 MG [place] [facility] 1-3 patches onto the skin daily as needed (pain).     [provider]  cholecalciferol (VITAMIN D) 400 units TABS tablet Take 400 Units by mouth at bedtime.    [provider]  clopidogrel (PLAVIX) 75 MG tablet Take 75 mg by mouth daily.    [provider]  diclofenac Sodium (VOLTAREN) 1 % GEL Apply 2 g topically 3 (three) times daily. Patient not taking: Reported on 08/16/2020 02/24/19   [provider]  docusate sodium (COLACE) 100 MG capsule Take 100 mg by mouth daily as  needed (for constipation).  Patient not taking: Reported on 08/16/2020    [provider]  ezetimibe (ZETIA) 10 MG tablet Take 10 mg by mouth daily. Patient not taking: Reported on 07/21/2020    [provider]  ferrous sulfate 325 (65 FE) MG tablet Take 325 mg by mouth at bedtime. Patient not taking: Reported on 07/21/2020    [provider]  fexofenadine (ALLEGRA) 180 MG tablet Take 180 mg by mouth at bedtime as needed for allergies or rhinitis.    [provider]  isosorbide mononitrate (IMDUR) 30 MG 24 hr tablet Take 30 mg by mouth daily.    [provider]  meloxicam (MOBIC) 7.5 MG tablet Take 1 tablet (7.5 mg total) by mouth daily. 10/11/20 10/11/21  Cuthriell, Charline Bills, PA-C  METAMUCIL FIBER PO Take by mouth as needed.    [provider]  methocarbamol (ROBAXIN) 500 MG tablet  Take 1 tablet (500 mg total) by mouth 4 (four) times daily. 10/11/20   Cuthriell, Charline Bills, PA-C  metoprolol succinate (TOPROL-XL) 25 MG 24 hr tablet Take 12.5 mg by mouth 2 (two) times daily.  08/02/16 08/30/20  [provider]  montelukast (SINGULAIR) 10 MG tablet Take 10 mg by mouth daily. Patient not taking: Reported on 08/16/2020 06/04/19   [provider]  Multiple Vitamins-Minerals (ICAPS AREDS 2 PO) Take by mouth 2 (two) times daily.    [provider]  nitroGLYCERIN (NITROSTAT) 0.4 MG SL tablet Place 0.4 mg under the tongue every 5 (five) minutes as needed for chest pain.  07/06/16   [provider]  nystatin (MYCOSTATIN/NYSTOP) powder Apply topically 2 (two) times daily. Patient not taking: Reported on 07/21/2020 07/07/20   Noreene Filbert, MD  nystatin (MYCOSTATIN/NYSTOP) powder Apply topically 2 (two) times daily. Patient not taking: Reported on 07/21/2020 07/05/20   Noreene Filbert, MD  ondansetron (ZOFRAN) 8 MG tablet Take 1 tablet (8 mg total) by mouth every 8 (eight) hours as needed for nausea or vomiting. Patient not taking:  Reported on 07/21/2020 05/31/17   Jacquelin Hawking, NP  PARoxetine (PAXIL) 20 MG tablet Take 20 mg by mouth at bedtime.    [provider]  Polyvinyl Alcohol-Povidone PF 1.4-0.6 % SOLN Place 1-2 drops into both eyes 3 (three) times daily as needed (for dry eyes.). Patient not taking: Reported on 07/21/2020    [provider]  trolamine salicylate (ASPERCREME) 10 % cream Apply 1 application topically 4 (four) times daily as needed for muscle pain.  Patient not taking: Reported on 07/21/2020    [provider]  UNABLE TO FIND CBD Oil Patient not taking: Reported on 07/21/2020    [provider]  vitamin B-12 (CYANOCOBALAMIN) 1000 MCG tablet Take 1,000 mcg by mouth at bedtime.    [provider]  Vitamin D, Ergocalciferol, (DRISDOL) 1.25 MG (50000 UT) CAPS capsule Take by mouth. 12/29/18   [provider]    Allergies Atorvastatin, Metoprolol, Other, Red yeast rice [cholestin], Statins, and Sulfamethoxazole-trimethoprim  Family History  Problem Relation Age of Onset   Breast cancer Paternal Aunt        post men.    Breast cancer Cousin    Lymphoma Father    Leukemia Maternal Aunt    Cervical cancer Paternal Grandmother        d. 88s   Breast cancer Cousin        dx 34   Cervical cancer Cousin    Kidney cancer Cousin     Social History Social History   Tobacco Use   Smoking status: Never   Smokeless tobacco: Never  Vaping Use   Vaping Use: Never used  Substance Use Topics   Alcohol use: No   Drug use: No    Review of Systems  Constitutional: Negative for fever. Eyes: Negative for visual changes. ENT: Negative for facial injury. + neck pain Cardiovascular: Negative for chest injury. Respiratory: Negative for shortness of breath. Negative for chest wall injury. Gastrointestinal: Negative for abdominal pain or injury. Genitourinary: Negative for dysuria. Musculoskeletal: Negative for back injury, ++ b/l pelvis pain, and R knee  pain. Skin: Negative for laceration/abrasions. Neurological: + head injury.   ____________________________________________   PHYSICAL EXAM:  VITAL SIGNS: ED Triage Vitals  Enc Vitals Group     BP 11/12/20 1929 (!) 145/83     Pulse Rate 11/12/20 1929 78     Resp 11/12/20 1929 18  Temp 11/12/20 1929 98.5 F (36.9 C)     Temp Source 11/12/20 1929 Oral     SpO2 11/12/20 1929 96 %     Weight --      Height --      Head Circumference --      Peak Flow --      Pain Score 11/12/20 1930 4     Pain Loc --      Pain Edu? --      Excl. in [place]? --     Full spinal precautions maintained throughout the trauma exam. Constitutional: Alert and oriented. No acute distress. Does not appear intoxicated. HEENT Head: Normocephalic and atraumatic. Face: No facial bony tenderness. Stable midface Ears: No hemotympanum bilaterally. No Battle sign Eyes: No eye injury. PERRL. No raccoon eyes Nose: Nontender. No epistaxis. No rhinorrhea Mouth/Throat: Mucous membranes are moist. No oropharyngeal blood. No dental injury. Airway patent without stridor. Normal voice. Neck: no C-collar. No midline c-spine tenderness.  Cardiovascular: Normal rate, regular rhythm. Normal and symmetric distal pulses are present in all extremities. Pulmonary/Chest: Chest wall is stable and nontender to palpation/compression. Normal respiratory effort. Breath sounds are normal. No crepitus.  Abdominal: Soft, nontender, non distended. Musculoskeletal: Nontender with normal full range of motion in all extremities. No deformities. No thoracic or lumbar midline spinal tenderness. Pelvis is stable. Skin: Skin is warm, dry and intact. No abrasions or contutions. Psychiatric: Speech and behavior are appropriate. Neurological: Normal speech and language. Moves all extremities to command. No gross focal neurologic deficits are appreciated.  Glascow Coma Score: 4 - Opens eyes on own 6 - Follows simple motor commands 5 - Alert and  oriented GCS: 15   ____________________________________________   LABS (all labs ordered are listed, but only abnormal results are displayed)  Labs Reviewed - No data to display ____________________________________________  EKG  none  ____________________________________________  RADIOLOGY  I have personally reviewed the images performed during this visit and I agree with the Radiologist's read.   Interpretation by Radiologist:  DG Pelvis 1-2 Views  Result Date: 11/13/2020 CLINICAL DATA:  Fall, right pelvic pain. EXAM: PELVIS - 1-2 VIEW COMPARISON:  None. FINDINGS: The cortical margins of the bony pelvis are intact. No fracture. Pubic symphysis and sacroiliac joints are congruent. Both femoral heads are well-seated in the respective acetabula. Mild bilateral hip osteoarthritis. IMPRESSION: No pelvic fracture. Electronically Signed   By: Keith Rake M.D.   On: 11/13/2020 00:37   CT HEAD WO CONTRAST  Result Date: 11/12/2020 CLINICAL DATA:  Pt reports she stubbed her toe and fell to the ground, hit her head, reports a knot on the right side of her head. Pt is on plavix. EXAM: CT HEAD WITHOUT CONTRAST CT CERVICAL SPINE WITHOUT CONTRAST TECHNIQUE: Multidetector CT imaging of the head and cervical spine was performed following the standard protocol without intravenous contrast. Multiplanar CT image reconstructions of the cervical spine were also generated. COMPARISON:  CT head and cervical spine 10/11/2020 FINDINGS: CT HEAD FINDINGS Brain: No evidence of large-territorial acute infarction. No parenchymal hemorrhage. No mass lesion. Similar-appearing bilateral chronic hygromas. No extra-axial collection. No mass effect or midline shift. No hydrocephalus. Basilar cisterns are patent. Vascular: No hyperdense vessel. Skull: No acute fracture or focal lesion. Sinuses/Orbits: Paranasal sinuses and mastoid air cells are clear. The orbits are unremarkable. Other: None. CT CERVICAL SPINE FINDINGS  Alignment: Normal. Skull base and vertebrae: No acute fracture. No aggressive appearing focal osseous lesion or focal pathologic process. Soft tissues and spinal canal:  No prevertebral fluid or swelling. No visible canal hematoma. Upper chest: Unremarkable. Other: None. IMPRESSION: 1. No acute intracranial abnormality. 2. No acute displaced fracture or traumatic listhesis of the cervical spine. Electronically Signed   By: Iven Finn M.D.   On: 11/12/2020 20:19   CT Cervical Spine Wo Contrast  Result Date: 11/12/2020 CLINICAL DATA:  Pt reports she stubbed her toe and fell to the ground, hit her head, reports a knot on the right side of her head. Pt is on plavix. EXAM: CT HEAD WITHOUT CONTRAST CT CERVICAL SPINE WITHOUT CONTRAST TECHNIQUE: Multidetector CT imaging of the head and cervical spine was performed following the standard protocol without intravenous contrast. Multiplanar CT image reconstructions of the cervical spine were also generated. COMPARISON:  CT head and cervical spine 10/11/2020 FINDINGS: CT HEAD FINDINGS Brain: No evidence of large-territorial acute infarction. No parenchymal hemorrhage. No mass lesion. Similar-appearing bilateral chronic hygromas. No extra-axial collection. No mass effect or midline shift. No hydrocephalus. Basilar cisterns are patent. Vascular: No hyperdense vessel. Skull: No acute fracture or focal lesion. Sinuses/Orbits: Paranasal sinuses and mastoid air cells are clear. The orbits are unremarkable. Other: None. CT CERVICAL SPINE FINDINGS Alignment: Normal. Skull base and vertebrae: No acute fracture. No aggressive appearing focal osseous lesion or focal pathologic process. Soft tissues and spinal canal: No prevertebral fluid or swelling. No visible canal hematoma. Upper chest: Unremarkable. Other: None. IMPRESSION: 1. No acute intracranial abnormality. 2. No acute displaced fracture or traumatic listhesis of the cervical spine. Electronically Signed   By: Iven Finn M.D.   On: 11/12/2020 20:19   DG Knee Complete 4 Views Right  Result Date: 11/13/2020 CLINICAL DATA:  Fall, right knee pain. EXAM: RIGHT KNEE - COMPLETE 4+ VIEW COMPARISON:  None. FINDINGS: No evidence of fracture, dislocation, or joint effusion. Mild tricompartmental osteoarthritis. The bones appear subjectively under mineralized. Small quadriceps tendon enthesophyte. Soft tissues are unremarkable. IMPRESSION: 1. No fracture or subluxation of the right knee. 2. Mild tricompartmental osteoarthritis. Electronically Signed   By: Keith Rake M.D.   On: 11/13/2020 00:36     ____________________________________________   PROCEDURES  Procedure(s) performed: None Procedures Critical Care performed:  None ____________________________________________   INITIAL IMPRESSION / ASSESSMENT AND PLAN / ED COURSE   80 y.o. female with several comorbidities as listed below who presents for evaluation after mechanical fall.  Patient is well-appearing in no distress with normal vital signs, physical exam with no significant signs of trauma.  Imaging studies all visualized by me with no signs of fracture, dislocation, or intracranial traumatic injuries.  Patient given Tylenol, ambulating with no difficulties.  Will discharge home to the care of her family.  Discussed my standard return precautions.      ____________________________________________  Please note:  Patient was evaluated in Emergency Department today for the symptoms described in the history of present illness. Patient was evaluated in the context of the global COVID-19 pandemic, which necessitated consideration that the patient might be at risk for infection with the SARS-CoV-2 virus that causes COVID-19. Institutional protocols and algorithms that pertain to the evaluation of patients at risk for COVID-19 are in a state of rapid change based on information released by regulatory bodies including the CDC and federal and state  organizations. These policies and algorithms were followed during the patient's care in the ED.  Some ED evaluations and interventions may be delayed as a result of limited staffing during the pandemic.   ____________________________________________   FINAL CLINICAL IMPRESSION(S) / ED  DIAGNOSES   Final diagnoses:  Fall, initial encounter      NEW MEDICATIONS STARTED DURING THIS VISIT:  ED Discharge Orders     None        Note:  This document was prepared using Dragon voice recognition software and may include unintentional dictation errors.    Alfred Levins, [place], MD 11/13/20 0130

## 2020-11-13 NOTE — Discharge Instructions (Addendum)

## 2021-01-20 ENCOUNTER — Inpatient Hospital Stay: Payer: Medicare Other | Attending: Nurse Practitioner | Admitting: Nurse Practitioner

## 2021-03-02 ENCOUNTER — Ambulatory Visit: Payer: Medicare Other | Admitting: Radiation Oncology

## 2021-03-08 ENCOUNTER — Ambulatory Visit: Payer: Medicare Other | Admitting: Radiation Oncology

## 2021-03-27 NOTE — Progress Notes (Signed)
Cardiology Office Note  Date:  03/28/2021   ID:  CERISE LIEBER, DOB 10-15-1940, MRN [id number]  PCP:  Dion Body, MD   Chief Complaint  Patient presents with   New Patient (Initial Visit)    Self referral to establish care for CAD; former patient of Dr. Clayborn Bigness. Patient c/o palpitations, shortness of breath and chest pain for the past couple of weeks. Medications reviewed by the patient's bottles.     HPI:  [name] is a 81 year old woman with past medical history of CAD,  s/p PCI/DES to proximal LAD (2018),  hypertension,  hyperlipidemia,  asthma,  obesity,  Sleep apnea, did not tolerate CPAP DDD, h/o breast cancer, fatty liver, recent diagnosis of macular degeneration and anxiety  Who presents for new patient evaluation of her coronary disease  Previously followed by [facility] clinic cardiology Discussed prior cardiac history including stenting to proximal LAD 2018 Cardiac catheterization results as requested, reviewed, residual 60% proximal left circumflex disease, 40% left main disease  On further discussion she denies anginal symptoms, No regular exercise program,  Mild shortness of breath on exertion  She reports History of falls, seen in the emergency room  Echocardiogram 2D complete: (09/21/2019) reviewed on today's visit NORMAL LEFT VENTRICULAR SYSTOLIC FUNCTION WITH MILD LVH NORMAL RIGHT VENTRICULAR SYSTOLIC FUNCTION TRIVIAL REGURGITATION NOTED (See above) NO VALVULAR STENOSIS TRIVIAL MR, TR EF 55%  Risk factors Nonsmoker No diabetes Total chol 229, LDL 136  Statin side effects, wonders what other options she has available  EKG personally reviewed by myself on todays visit Normal sinus rhythm rate 69 bpm no significant ST-T wave changes  PMH:   has a past medical history of Allergies, Anginal pain ([place]), Anxiety, Arthritis, Breast cancer ([place]) (55/7322), Complication of anesthesia, Coronary artery disease, Dyspnea, Family history of breast  cancer, Family history of kidney cancer, Family history of leukemia, Family history of lymphoma, Fatty liver, GERD (gastroesophageal reflux disease), Headache, High cholesterol, Hypertension, Macular degeneration, Myocardial infarction [facility]), Personal history of radiation therapy, PONV (postoperative nausea and vomiting), and Sleep apnea.  PSH:    Past Surgical History:  Procedure Laterality Date   ABDOMINAL HYSTERECTOMY     ACHILLES TENDON SURGERY     BREAST BIOPSY Right 03/05/2017   Affirm Bx- invasive mammary   BREAST LUMPECTOMY Right 03/25/2017   invasive mammary, clear margins, LN negative   BUNIONECTOMY Bilateral    CATARACT EXTRACTION W/PHACO Left 01/29/2017   Procedure: CATARACT EXTRACTION PHACO AND INTRAOCULAR LENS PLACEMENT ([place]);  Surgeon: Birder Robson, MD;  Location: [facility] ORS;  Service: Ophthalmology;  Laterality: Left;  [place] 00:49.9 AP% 20.3 CDE 10.11 Fluid Pack lot # [id number]   CATARACT EXTRACTION W/PHACO Right 08/30/2020   Procedure: CATARACT EXTRACTION PHACO AND INTRAOCULAR LENS PLACEMENT ([facility]) RIGHT 11.15 01:09.0;  Surgeon: Birder Robson, MD;  Location: [place];  Service: Ophthalmology;  Laterality: Right;  [place] ANGIOPLASTY     STENT 5/18   CORONARY STENT INTERVENTION N/A 07/18/2016   Procedure: Coronary Stent Intervention;  Surgeon: Yolonda Kida, MD;  Location: [place] CV LAB;  Service: Cardiovascular;  Laterality: N/A;   CTR     INTRAVASCULAR PRESSURE WIRE/FFR STUDY N/A 07/18/2016   Procedure: Intravascular Pressure Wire/FFR Study;  Surgeon: Yolonda Kida, MD;  Location: [place] [facility];  Service: Cardiovascular;  Laterality: N/A;   LEFT HEART CATH AND CORONARY ANGIOGRAPHY N/A 07/18/2016   Procedure: Left Heart Cath and Coronary Angiography;  Surgeon: Yolonda Kida, MD;  Location: [facility]  INVASIVE CV LAB;  Service: Cardiovascular;  Laterality: N/A;   PARTIAL MASTECTOMY WITH NEEDLE LOCALIZATION Right 03/25/2017    Procedure: PARTIAL MASTECTOMY WITH NEEDLE LOCALIZATION;  Surgeon: Herbert Pun, MD;  Location: [facility] ORS;  Service: General;  Laterality: Right;   SENTINEL NODE BIOPSY Right 03/25/2017   Procedure: SENTINEL NODE BIOPSY;  Surgeon: Herbert Pun, MD;  Location: [facility] ORS;  Service: General;  Laterality: Right;   TRIGGER FINGER RELEASE Bilateral     Current Outpatient Medications  Medication Sig Dispense Refill   anastrozole (ARIMIDEX) 1 MG tablet Take 1 tablet (1 mg total) by mouth daily. 90 tablet 3   aspirin EC 81 MG tablet Take 81 mg by mouth daily.      budesonide (PULMICORT) 0.25 MG/2ML nebulizer solution Take 0.25 mg by nebulization 2 (two) times daily as needed.     Calcium Carbonate-Simethicone 750-80 MG CHEW Chew 1 tablet by mouth daily as needed (heartburn).     Calcium Carbonate-Vitamin D 600-400 MG-UNIT tablet Take 2 tablets by mouth daily.     Camphor-Menthol-Capsicum 80-24-16 MG PTCH Place 1-3 patches onto the skin daily as needed (pain).      cholecalciferol (VITAMIN D) 400 units TABS tablet Take 400 Units by mouth at bedtime.     docusate sodium (COLACE) 100 MG capsule Take 100 mg by mouth daily as needed (for constipation).     ezetimibe (ZETIA) 10 MG tablet Take 10 mg by mouth daily.     metoprolol succinate (TOPROL-XL) 25 MG 24 hr tablet Take 12.5 mg by mouth 2 (two) times daily.      montelukast (SINGULAIR) 10 MG tablet Take 10 mg by mouth daily.     Multiple Vitamins-Minerals (ICAPS AREDS 2 PO) Take by mouth 2 (two) times daily.     nitroGLYCERIN (NITROSTAT) 0.4 MG SL tablet Place 0.4 mg under the tongue every 5 (five) minutes as needed for chest pain.      PARoxetine (PAXIL) 20 MG tablet Take 20 mg by mouth at bedtime.     Polyvinyl Alcohol-Povidone PF 1.4-0.6 % SOLN Place 1-2 drops into both eyes 3 (three) times daily as needed (for dry eyes.).     trolamine salicylate (ASPERCREME) 10 % cream Apply 1 application topically 4 (four) times daily as needed for  muscle pain.     UNABLE TO FIND CBD Oil     vitamin B-12 (CYANOCOBALAMIN) 1000 MCG tablet Take 1,000 mcg by mouth at bedtime.     Vitamin D, Ergocalciferol, (DRISDOL) 1.25 MG (50000 UT) CAPS capsule Take by mouth.     clopidogrel (PLAVIX) 75 MG tablet Take 75 mg by mouth daily. (Patient not taking: Reported on 03/28/2021)     diclofenac Sodium (VOLTAREN) 1 % GEL Apply 2 g topically 3 (three) times daily. (Patient not taking: Reported on 08/16/2020)     ferrous sulfate 325 (65 FE) MG tablet Take 325 mg by mouth at bedtime. (Patient not taking: Reported on 07/21/2020)     fexofenadine (ALLEGRA) 180 MG tablet Take 180 mg by mouth at bedtime as needed for allergies or rhinitis. (Patient not taking: Reported on 03/28/2021)     isosorbide mononitrate (IMDUR) 30 MG 24 hr tablet Take 30 mg by mouth daily. (Patient not taking: Reported on 03/28/2021)     meloxicam (MOBIC) 7.5 MG tablet Take 1 tablet (7.5 mg total) by mouth daily. (Patient not taking: Reported on 03/28/2021) 30 tablet 0   METAMUCIL FIBER PO Take by mouth as needed. (Patient not taking: Reported on 03/28/2021)  methocarbamol (ROBAXIN) 500 MG tablet Take 1 tablet (500 mg total) by mouth 4 (four) times daily. (Patient not taking: Reported on 03/28/2021) 16 tablet 0   nystatin (MYCOSTATIN/NYSTOP) powder Apply topically 2 (two) times daily. (Patient not taking: Reported on 07/21/2020) 30 g 0   nystatin (MYCOSTATIN/NYSTOP) powder Apply topically 2 (two) times daily. (Patient not taking: Reported on 07/21/2020) 30 g 2   ondansetron (ZOFRAN) 8 MG tablet Take 1 tablet (8 mg total) by mouth every 8 (eight) hours as needed for nausea or vomiting. (Patient not taking: Reported on 07/21/2020) 40 tablet 0   No current facility-administered medications for this visit.    Allergies:   Atorvastatin, Metoprolol, Other, Red yeast rice [cholestin], Statins, and Sulfamethoxazole-trimethoprim   Social History:  The patient  reports that she has never smoked. She has never used  smokeless tobacco. She reports that she does not drink alcohol and does not use drugs.   Family History:   family history includes Breast cancer in her cousin, cousin, and paternal aunt; Cervical cancer in her cousin and paternal grandmother; Kidney cancer in her cousin; Leukemia in her maternal aunt; Lymphoma in her father.    Review of Systems: Review of Systems  Constitutional: Negative.   HENT: Negative.    Respiratory: Negative.    Cardiovascular: Negative.   Gastrointestinal: Negative.   Musculoskeletal: Negative.   Neurological: Negative.   Psychiatric/Behavioral: Negative.    All other systems reviewed and are negative.   PHYSICAL EXAM: VS:  BP 124/72 (BP Location: Left Arm, Patient Position: Sitting, Cuff Size: Large)    Ht 5\' 2"  (1.575 m)    Wt 220 lb 4 oz (99.9 kg)    SpO2 98%    BMI 40.28 kg/m  , BMI Body mass index is 40.28 kg/m. GEN: Well nourished, well developed, in no acute distress obese HEENT: normal Neck: no JVD, carotid bruits, or masses Cardiac: RRR; no murmurs, rubs, or gallops,no edema  Respiratory:  clear to auscultation bilaterally, normal work of breathing GI: soft, nontender, nondistended, + BS MS: no deformity or atrophy Skin: warm and dry, no rash Neuro:  Strength and sensation are intact Psych: euthymic mood, full affect   Recent Labs: 10/11/2020: BUN 17; Creatinine, Ser 0.64; Hemoglobin 13.8; Platelets 289; Potassium 3.8; Sodium 138    Lipid Panel Lab Results  Component Value Date   CHOL 236 (H) 08/05/2009   HDL 45.90 08/05/2009   TRIG 214.0 (H) 08/05/2009      Wt Readings from Last 3 Encounters:  03/28/21 220 lb 4 oz (99.9 kg)  10/11/20 219 lb (99.3 kg)  08/30/20 234 lb (106.1 kg)       ASSESSMENT AND PLAN:  Problem List Items Addressed This Visit       Cardiology Problems   Essential hypertension   Coronary artery disease involving native coronary artery of native heart - Primary   Mixed hyperlipidemia     Other    PALPITATIONS   Coronary disease with stable angina Prior stenting to proximal LAD, residual left circumflex disease and 40% left main disease 2018 Results pulled up and reviewed in detail Stressed importance of aggressive cholesterol control Goal LDL less than 60 Reports having statin intolerance, tried 3 different statins Last modification recommended, weight loss, low carbohydrate diet, walking program Cholesterol management as below  Hyperlipidemia Well above goal  statin intolerance -Recommend we start Repatha 140 subcu every other week Continue Zetia  Essential hypertension Blood pressure is well controlled on today's visit. No changes  made to the medications.  Morbid obesity BMI 40 We have encouraged continued exercise, careful diet management in an effort to lose weight.  Sleep apnea Not on CPAP, reports she did not tolerate Weight loss recommended   Total encounter time more than 60 minutes  Greater than 50% was spent in counseling and coordination of care with the patient    Signed, Esmond Plants, M.D., Ph.D. [place], [place]

## 2021-03-28 ENCOUNTER — Other Ambulatory Visit: Payer: Self-pay

## 2021-03-28 ENCOUNTER — Ambulatory Visit: Payer: Medicare Other | Admitting: Cardiovascular Disease

## 2021-03-28 ENCOUNTER — Encounter: Payer: Self-pay | Admitting: Cardiovascular Disease

## 2021-03-28 VITALS — BP 124/72 | Ht 62.0 in | Wt 220.2 lb

## 2021-03-28 DIAGNOSIS — Z79899 Other long term (current) drug therapy: Secondary | ICD-10-CM

## 2021-03-28 DIAGNOSIS — I10 Essential (primary) hypertension: Secondary | ICD-10-CM

## 2021-03-28 DIAGNOSIS — T466X5A Adverse effect of antihyperlipidemic and antiarteriosclerotic drugs, initial encounter: Secondary | ICD-10-CM

## 2021-03-28 DIAGNOSIS — I25118 Atherosclerotic heart disease of native coronary artery with other forms of angina pectoris: Secondary | ICD-10-CM

## 2021-03-28 DIAGNOSIS — R002 Palpitations: Secondary | ICD-10-CM

## 2021-03-28 DIAGNOSIS — M791 Myalgia, unspecified site: Secondary | ICD-10-CM

## 2021-03-28 DIAGNOSIS — E782 Mixed hyperlipidemia: Secondary | ICD-10-CM | POA: Diagnosis not present

## 2021-03-28 MED ORDER — REPATHA SURECLICK 140 MG/ML ~~LOC~~ SOAJ: 140.0000 mg | SUBCUTANEOUS | 6 refills | Status: DC

## 2021-03-28 NOTE — Patient Instructions (Addendum)
Medication Instructions:  - Your physician has recommended you make the following change in your medication:   1) START Repatha 140 mg: - inject into the skin subcutaneously once every 14 days   If you need a refill on your cardiac medications before your next appointment, please call your pharmacy.   Lab work: - Your physician recommends that you return for lab work in: 3 month- Lipid panel   Testing/Procedures: No new testing needed  Follow-Up: At [facility], you and your health needs are our priority.  As part of our continuing mission to provide you with exceptional heart care, we have created designated Provider Care Teams.  These Care Teams include your primary Cardiologist (physician) and Advanced Practice Providers (APPs -  Physician Assistants and Nurse Practitioners) who all work together to provide you with the care you need, when you need it.  You will need a follow up appointment in 12 months  Providers on your designated Care Team:   Murray Hodgkins, NP Christell Faith, PA-C Cadence Kathlen Mody, [place]  COVID-19 Vaccine Information can be found at: [email or web address].uk For questions related to vaccine distribution or appointments, please email [email or web address] or call [phone].     REPATHA (Evolocumab) injection What is this medication? EVOLOCUMAB (e voe LOK ue mab) treats high cholesterol. It is used with lifestyle changes, like diet and exercise. It is used alone or with other medicines. This medicine may be used for other purposes; ask your health care provider or pharmacist if you have questions. COMMON BRAND NAME(S): Repatha, Repatha SureClick What should I tell my care team before I take this medication? They need to know if you have any of these conditions: an unusual or allergic reaction to evolocumab, other medicines, latex, foods, dyes, or preservatives pregnant or trying to get  pregnant breast-feeding How should I use this medication? This medicine is injected under the skin. You will be taught how to prepare and give it. Take it as directed on the prescription label at the same time every day. Keep taking it unless your health care provider tells you to stop. It is important that you put your used needles and syringes in a special sharps container. Do not put them in a trash can. If you do not have a sharps container, call your pharmacist or health care provider to get one. This medicine comes with INSTRUCTIONS FOR USE. Ask your pharmacist for directions on how to use this medicine. Read the information carefully. Talk to your pharmacist or health care provider if you have questions. Talk to your health care provider about the use of this medicine in children. While it may be prescribed for children as young as 10 for selected conditions, precautions do apply. Overdosage: If you think you have taken too much of this medicine contact a poison control center or emergency room at once. NOTE: This medicine is only for you. Do not share this medicine with others. What if I miss a dose? It is important not to miss any doses. Talk to your health care provider about what to do if you miss a dose. What may interact with this medication? Interactions are not expected. This list may not describe all possible interactions. Give your health care provider a list of all the medicines, herbs, non-prescription drugs, or dietary supplements you use. Also tell them if you smoke, drink alcohol, or use illegal drugs. Some items may interact with your medicine. What should I watch for while using this medication? Visit your health care provider  for regular checks on your progress. Tell your health care provider if your symptoms do not start to get better or if they get worse. You may need blood work while you are taking this drug. Do not wear the on-body infuser during an MRI. Taking this  drug is only part of a total heart healthy program. Your health care provider may give you a special diet to follow. Avoid alcohol. Avoid smoking. Ask your health care provider how much you should exercise. What side effects may I notice from receiving this medication? Side effects that you should report to your doctor or health care provider as soon as possible: allergic reactions (skin rash, itching or hives; swelling of the face, lips, or tongue) high blood sugar (increased hunger, thirst, or urination; unusually weak or tired, blurry vision) infection (fever, chills, cough, sore throat, pain, or trouble passing urine) Side effects that usually do not require medical attention (report to your doctor or health care provider if they continue or are bothersome): diarrhea muscle pain nausea pain, redness, or irritation at site where injected This list may not describe all possible side effects. Call your doctor for medical advice about side effects. You may report side effects to FDA at 1-800-FDA-1088. Where should I keep my medication? Keep out of the reach of children and pets. Store in a refrigerator or at room temperature between 20 and 25 degrees C (68 and 77 degrees F). Refrigeration (preferred): Store it in the refrigerator. Do not freeze. Keep it in the original carton until you are ready to take it. Remove the dose from the carton about 30 minutes before it is time for you to take it. Throw away any unused medicine after the expiration date. Room temperature: This medicine may be stored at room temperature for up to 30 days. Keep it in the original carton until you are ready to take it. If it is stored at room temperature, throw away any unused medicine after 30 days or after it expires, whichever is first. Protect from light. Do not shake. Avoid exposure to extreme heat. To get rid of medicines that are no longer needed or have expired: Take the medicine to a medicine take-back program.  Check with your pharmacy or law enforcement to find a location. If you cannot return the medicine, ask your pharmacist or health care provider how to get rid of this medicine safely. NOTE: This sheet is a summary. It may not cover all possible information. If you have questions about this medicine, talk to your doctor, pharmacist, or health care provider.  2022 Elsevier/Gold Standard (2020-10-25 00:00:00)

## 2021-03-29 ENCOUNTER — Telehealth: Payer: Self-pay

## 2021-03-29 NOTE — Telephone Encounter (Signed)
Request Reference Number: [id number]. REPATHA SURE INJ 140MG /ML is approved through 09/26/2021. Your patient may now fill this prescription and it will be covered.

## 2021-03-29 NOTE — Telephone Encounter (Signed)
Prior authorization initiated by [employer] in [email or web address] KEY: [id number]  Clinical questions answered.  RESPONSE: Your information has been sent to [facility].

## 2021-05-11 ENCOUNTER — Other Ambulatory Visit: Payer: Self-pay | Admitting: Nurse Practitioner

## 2021-05-11 DIAGNOSIS — R1032 Left lower quadrant pain: Secondary | ICD-10-CM

## 2021-05-12 ENCOUNTER — Telehealth: Payer: Self-pay | Admitting: Cardiovascular Disease

## 2021-05-12 ENCOUNTER — Other Ambulatory Visit: Payer: Self-pay

## 2021-05-12 ENCOUNTER — Ambulatory Visit
Admission: RE | Admit: 2021-05-12 | Discharge: 2021-05-12 | Disposition: A | Discharge status: Home / Self Care | Payer: Medicare Other | Source: Ambulatory Visit | Attending: Nurse Practitioner | Admitting: Nurse Practitioner

## 2021-05-12 DIAGNOSIS — R1032 Left lower quadrant pain: Secondary | ICD-10-CM | POA: Insufficient documentation

## 2021-05-12 MED ORDER — IOHEXOL 300 MG/ML  SOLN
100.0000 mL | Freq: Once | INTRAMUSCULAR | Status: AC | PRN
  Administered 2021-05-12: 100 mL via INTRAVENOUS

## 2021-05-12 NOTE — Telephone Encounter (Signed)
Preoperative team, please forward message to Dr. Rockey Situ for review.  Thank you for your help. ? ?Linda Yang. Linda Driggers NP-C ? ?  ?05/12/2021, 12:11 PM ?[place] ?[address] ?Office [phone] Fax [phone] ? ?

## 2021-05-12 NOTE — Telephone Encounter (Signed)
See message from pre op provider Coletta Memos, FNP.  ?

## 2021-05-12 NOTE — Telephone Encounter (Signed)
Patient is calling was advised needed a stress test due to procedures in near future. Please assist. ?

## 2021-05-15 NOTE — Telephone Encounter (Signed)
CLEARANCE NOTES HAVE BEEN FAXED TO REQUESTING OFFICE  ? ?DR. CAMERON LOCKLEAR ?[facility] - Gastroenterology ?[place], [place] [address] ?Office: [phone] ? Fax: [phone] ?

## 2021-05-15 NOTE — Telephone Encounter (Signed)
? ?  Patient Name: Linda Yang  ?DOB: 03/12/1940 ?MRN: [id number] ? ?Primary Cardiologist: Dr. Rockey Situ ? ?Chart reviewed as part of pre-operative protocol coverage. Per Dr. Donivan Scull review, "Seen in clinic by myself recently, no further cardiac testing needed. Acceptable risk for GI procedure." I do not see a specific number to route to with the GI team so will route to callback to ensure this recommendation is relayed to the right place. Please call with questions. ? ?Charlie Pitter, PA-C ?05/15/2021, 8:24 AM ? ? ?

## 2021-05-26 ENCOUNTER — Encounter: Payer: Self-pay | Admitting: Emergency Medicine

## 2021-05-26 ENCOUNTER — Ambulatory Visit
Admission: EM | Admit: 2021-05-26 | Discharge: 2021-05-26 | Disposition: A | Discharge status: Home / Self Care | Payer: Medicare Other

## 2021-05-26 ENCOUNTER — Other Ambulatory Visit: Payer: Self-pay

## 2021-05-26 DIAGNOSIS — J069 Acute upper respiratory infection, unspecified: Secondary | ICD-10-CM

## 2021-05-26 MED ORDER — PROMETHAZINE-DM 6.25-15 MG/5ML PO SYRP: 5.0000 mL | ORAL_SOLUTION | Freq: Four times a day (QID) | ORAL | 0 refills | Status: AC | PRN

## 2021-05-26 MED ORDER — IPRATROPIUM BROMIDE 0.06 % NA SOLN: 2.0000 | Freq: Four times a day (QID) | NASAL | 12 refills | Status: AC

## 2021-05-26 MED ORDER — BENZONATATE 100 MG PO CAPS: 200.0000 mg | ORAL_CAPSULE | Freq: Three times a day (TID) | ORAL | 0 refills | Status: AC

## 2021-05-26 NOTE — ED Triage Notes (Addendum)
Patient c/o cough, runny nose, nasal congestion that started 2-3 days ago.  Patient denies fevers but reports chills.  Patient states that she had COVID at the end of February.  ?

## 2021-05-26 NOTE — Discharge Instructions (Signed)
Use the Atrovent nasal spray, 2 squirts in each nostril every 6 hours, as needed for runny nose and postnasal drip. ? ?Use the Tessalon Perles every 8 hours during the day.  Take them with a small sip of water.  They may give you some numbness to the base of your tongue or a metallic taste in your mouth, this is normal. ? ?Use the Promethazine DM cough syrup at bedtime for cough and congestion.  It will make you drowsy so do not take it during the day. ? ?Tylenol and Ibuprofen as needed for fever or pain. ? ?Return for reevaluation or see your primary care provider for any new or worsening symptoms.  ?

## 2021-05-26 NOTE — ED Provider Notes (Signed)
?[facility] ? ? ? ?CSN: [id number] ?Arrival date & time: 05/26/21  1127 ? ? ?  ? ?History   ?Chief Complaint ?Chief Complaint  ?Patient presents with  ? Nasal Congestion  ? Cough  ? ? ?HPI ?Linda Yang is a 81 y.o. female.  ? ?HPI ? ?81 year old female here for evaluation of respiratory complaints. ? ?Patient reports that for last 2 to 3 days she has been experiencing sweats, chills, headache, nasal congestion with clear runny nose and postnasal drip.  She does endorse a scratchy throat, nonproductive cough, shortness of breath, wheezing, and some diarrhea.  She is also has some body aches but states they are not above her baseline body aches.  She has not had a fever, ear pain, nausea or vomiting, or any recent travel.  She is concerned she may have contracted COVID again but she just had COVID in late December.  Discussed with the patient that if she had COVID in December it was most likely Omicron and that her antibodies should still be present given that is only April.  Also have not been seeing COVID here as of late. ? ?Past Medical History:  ?Diagnosis Date  ? Allergies   ? Anginal pain ([place])   ? occas. took ntg last week  ? Anxiety   ? Arthritis   ? Breast cancer ([place]) 02/2017  ? right breast  ? Complication of anesthesia   ? Coronary artery disease   ? Dyspnea   ? doe  ? Family history of breast cancer   ? Family history of kidney cancer   ? Family history of leukemia   ? Family history of lymphoma   ? Fatty liver   ? GERD (gastroesophageal reflux disease)   ? Headache   ? High cholesterol   ? Hypertension   ? Macular degeneration   ? Myocardial infarction [facility])   ? 5/18  ? Personal history of radiation therapy   ? PONV (postoperative nausea and vomiting)   ? Sleep apnea   ? no CPAP  ? ? ?Patient Active Problem List  ? Diagnosis Date Noted  ? Genetic testing 12/31/2018  ? Family history of breast cancer   ? Family history of lymphoma   ? Family history of leukemia   ? Family history of kidney  cancer   ? Mild persistent asthma without complication 88/50/2774  ? B12 deficiency 02/10/2018  ? Mixed hyperlipidemia 02/10/2018  ? Non morbid obesity due to excess calories 02/10/2018  ? Primary cancer of upper outer quadrant of right female breast ([place]) 03/15/2017  ? History of right breast cancer 03/14/2017  ? Coronary artery disease involving native coronary artery of native heart 07/26/2016  ? 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 07/19/2016  ? Vitamin D deficiency 03/22/2016  ? Osteopenia of multiple sites 02/17/2016  ? Anxiety state 01/19/2015  ? DDD (degenerative disc disease), lumbar 08/10/2013  ? Lumbar radiculitis 08/10/2013  ? De Quervain's tenosynovitis, left 08/21/2012  ? Low back pain radiating to right leg 07/24/2012  ? CVA 07/08/2009  ? GOUT, UNSPECIFIED 07/06/2009  ? Essential hypertension 07/06/2009  ? PALPITATIONS 07/06/2009  ? ? ?Past Surgical History:  ?Procedure Laterality Date  ? ABDOMINAL HYSTERECTOMY    ? ACHILLES TENDON SURGERY    ? BREAST BIOPSY Right 03/05/2017  ? Affirm Bx- invasive mammary  ? BREAST LUMPECTOMY Right 03/25/2017  ? invasive mammary, clear margins, LN negative  ? BUNIONECTOMY Bilateral   ? CATARACT EXTRACTION W/PHACO  Left 01/29/2017  ? Procedure: CATARACT EXTRACTION PHACO AND INTRAOCULAR LENS PLACEMENT (IOC);  Surgeon: Birder Robson, MD;  Location: [facility]S;  Service: Ophthalmology;  Laterality: Left;  [place] 00:49.9 ?AP% 20.3 ?CDE 10.11 ?Fluid Pack lot # [id number]  ? CATARACT EXTRACTION W/PHACO Right 08/30/2020  ? Procedure: CATARACT EXTRACTION PHACO AND INTRAOCULAR LENS PLACEMENT (IOC) RIGHT 11.15 01:09.0;  Surgeon: Birder Robson, MD;  Location: [place];  Service: Ophthalmology;  Laterality: Right;  [place]  ? CORONARY ANGIOPLASTY    ? STENT 5/18  ? CORONARY STENT INTERVENTION N/A 07/18/2016  ? Procedure: Coronary Stent Intervention;  Surgeon: Yolonda Kida, MD;  Location: [place] [facility];  Service: Cardiovascular;   Laterality: N/A;  ? CTR    ? INTRAVASCULAR PRESSURE WIRE/FFR STUDY N/A 07/18/2016  ? Procedure: Intravascular Pressure Wire/FFR Study;  Surgeon: Yolonda Kida, MD;  Location: [place] [facility];  Service: Cardiovascular;  Laterality: N/A;  ? LEFT HEART CATH AND CORONARY ANGIOGRAPHY N/A 07/18/2016  ? Procedure: Left Heart Cath and Coronary Angiography;  Surgeon: Yolonda Kida, MD;  Location: [place] [facility];  Service: Cardiovascular;  Laterality: N/A;  ? PARTIAL MASTECTOMY WITH NEEDLE LOCALIZATION Right 03/25/2017  ? Procedure: PARTIAL MASTECTOMY WITH NEEDLE LOCALIZATION;  Surgeon: Herbert Pun, MD;  Location: [facility];  Service: General;  Laterality: Right;  ? SENTINEL NODE BIOPSY Right 03/25/2017  ? Procedure: SENTINEL NODE BIOPSY;  Surgeon: Herbert Pun, MD;  Location: [facility];  Service: General;  Laterality: Right;  ? TRIGGER FINGER RELEASE Bilateral   ? ? ?OB History   ?No obstetric history on file. ?  ? ? ? ?Home Medications   ? ?Prior to Admission medications   ?Medication Sig Start Date End Date Taking? Authorizing Provider  ?albuterol (PROVENTIL) (2.5 MG/3ML) 0.083% nebulizer solution Inhale into the lungs. 04/07/21 04/07/22 Yes [provider]  ?aspirin EC 81 MG tablet Take 81 mg by mouth daily.    Yes [provider]  ?benzonatate (TESSALON) 100 MG capsule Take 2 capsules (200 mg total) by mouth every 8 (eight) hours. 05/26/21  Yes Margarette Canada, NP  ?Calcium Carbonate-Vitamin D 600-400 MG-UNIT tablet Take 2 tablets by mouth daily.   Yes [provider]  ?Evolocumab (REPATHA SURECLICK) 409 MG/ML SOAJ Inject 140 mg into the skin every 14 (fourteen) days. 03/28/21  Yes Minna Merritts, MD  ?ezetimibe (ZETIA) 10 MG tablet Take 10 mg by mouth daily.   Yes [provider]  ?ipratropium (ATROVENT) 0.06 % nasal spray Place 2 sprays into both nostrils 4 (four) times daily. 05/26/21  Yes Margarette Canada, NP  ?METAMUCIL FIBER PO Take by mouth as needed.   Yes  [provider]  ?montelukast (SINGULAIR) 10 MG tablet Take 10 mg by mouth daily. 06/04/19  Yes [provider]  ?Multiple Vitamins-Minerals (ICAPS AREDS 2 PO) Take by mouth 2 (two) times daily.   Yes [provider]  ?pantoprazole (PROTONIX) 40 MG tablet Take by mouth. 05/12/21 05/12/22 Yes [provider]  ?PARoxetine (PAXIL) 20 MG tablet Take 20 mg by mouth at bedtime.   Yes [provider]  ?promethazine-dextromethorphan (PROMETHAZINE-DM) 6.25-15 MG/5ML syrup Take 5 mLs by mouth 4 (four) times daily as needed. 05/26/21  Yes Margarette Canada, NP  ?vitamin B-12 (CYANOCOBALAMIN) 1000 MCG tablet Take 1,000 mcg by mouth at bedtime.   Yes [provider]  ?Vitamin D, Ergocalciferol, (DRISDOL) 1.25 MG (50000 UT) CAPS capsule Take by mouth. 12/29/18  Yes [provider]  ?anastrozole (ARIMIDEX) 1  MG tablet Take 1 tablet (1 mg total) by mouth daily. 10/13/20   Lloyd Huger, MD  ?budesonide (PULMICORT) 0.25 MG/2ML nebulizer solution Take 0.25 mg by nebulization 2 (two) times daily as needed.    [provider]  ?Calcium Carbonate-Simethicone 750-80 MG CHEW Chew 1 tablet by mouth daily as needed (heartburn).    [provider]  ?Camphor-Menthol-Capsicum 80-24-16 MG [place] Place 1-3 patches onto the skin daily as needed (pain).     [provider]  ?cholecalciferol (VITAMIN D) 400 units TABS tablet Take 400 Units by mouth at bedtime.    [provider]  ?docusate sodium (COLACE) 100 MG capsule Take 100 mg by mouth daily as needed (for constipation).    [provider]  ?metoprolol succinate (TOPROL-XL) 25 MG 24 hr tablet Take 12.5 mg by mouth 2 (two) times daily.  08/02/16 03/28/21  [provider]  ?nitroGLYCERIN (NITROSTAT) 0.4 MG SL tablet Place 0.4 mg under the tongue every 5 (five) minutes as needed for chest pain.  07/06/16   [provider]  ?Polyvinyl Alcohol-Povidone PF 1.4-0.6 % SOLN Place 1-2 drops  into both eyes 3 (three) times daily as needed (for dry eyes.).    [provider]  ?trolamine salicylate (ASPERCREME) 10 % cream Apply 1 application topically 4 (four) times daily as needed for muscle p

## 2021-06-26 ENCOUNTER — Telehealth: Payer: Self-pay | Admitting: Cardiovascular Disease

## 2021-06-26 NOTE — Telephone Encounter (Signed)
Patient's spouse is calling stating the patient has been having issues with cold sweats that started on 05/05. She has not been feeling well since, having nausea every time she gets up. Vicente Serene also states around 06/19/21 the patient was having BP reading's in the 200's/100's. He states she has seen her PCP since this BP issue and they advised their BP cuff is about 10 points over. Believing they are not putting the BP cuff on right. Since this occurrence he reports her BP has been better. BP was taken while on the phone and read 148/95 which is most likely in the range of 138/85 per Dr. Netty Starring advising their BP cuff is off. Husband mentioned the patient is on a medication for breast cancer that causes her to sweat but not as severe as she has been lately. Please advise.  ?

## 2021-06-26 NOTE — Telephone Encounter (Signed)
Called and spoke with pt. Pt c/o cold sweats and nausea that began 06/23/21.  ?Per pt, PCP Dr. Netty Starring started chlorthalidone 25 mg daily on 06/19/21 for uncontrolled BP.  ?Pt also states she has taken a breast cancer medication for 3 years that also has side effect of sweating, however, pt has never had side effects from this med in the past 3 years.  ? ?Pt's BP today is 138/85 HR 101.  ? ?Pt does report sweating now. Denies chest pain, shortness of breath, nausea, dizziness, or any additional s/s.  ? ?Advised pt that it sounds most likely symptoms are d/t recent start of chlorthalidone and I advise she call Dr. Netty Starring now to notify of her s/s after starting med.  ? ?ER precautions provided.  ? ?Pt voiced understanding and has not further questions at this time.  ?

## 2021-08-05 ENCOUNTER — Ambulatory Visit
Admission: EM | Admit: 2021-08-05 | Discharge: 2021-08-05 | Disposition: A | Discharge status: Home / Self Care | Payer: Medicare Other | Attending: Emergency Medicine | Admitting: Emergency Medicine

## 2021-08-05 DIAGNOSIS — N390 Urinary tract infection, site not specified: Secondary | ICD-10-CM

## 2021-08-05 LAB — URINALYSIS, ROUTINE W REFLEX MICROSCOPIC
Bilirubin Urine: NEGATIVE
Glucose, UA: NEGATIVE mg/dL
Ketones, ur: NEGATIVE mg/dL
Nitrite: NEGATIVE
Protein, ur: NEGATIVE mg/dL
Specific Gravity, Urine: <1.005 — ABNORMAL LOW (ref 1.005–1.030)
pH: 5.5 (ref 5.0–8.0)

## 2021-08-05 LAB — URINALYSIS, MICROSCOPIC (REFLEX): WBC, UA: >50 WBC/hpf (ref 0–5)

## 2021-08-05 MED ORDER — PHENAZOPYRIDINE HCL 200 MG PO TABS: 200.0000 mg | ORAL_TABLET | Freq: Three times a day (TID) | ORAL | 0 refills | Status: AC

## 2021-08-05 MED ORDER — NITROFURANTOIN MONOHYD MACRO 100 MG PO CAPS: 100.0000 mg | ORAL_CAPSULE | Freq: Two times a day (BID) | ORAL | 0 refills | Status: AC

## 2021-08-05 NOTE — ED Provider Notes (Signed)
[facility]    CSN: [id number] Arrival date & time: 08/05/21  1214      History   Chief Complaint Chief Complaint  Patient presents with   Urinary Frequency    HPI Linda Yang is a 81 y.o. female.   HPI  81 year old female here for evaluation of urinary complaints.  Patient reports that since yesterday she has been experiencing urgency, frequency, and chills with urination.  She states that when she goes to urinate she will experience chills down both of her legs and both of her arms.  She is also been experiencing some low back pain, suprapubic pain, cloudiness of her urine, and increased nocturia.  She states that her urgency has gotten to the point where she is wearing a depends as she is afraid she may not make it to the bathroom in time.  She has taken over-the-counter Azo according to the package instructions to help with her urgency and frequency of urination.  She denies any burning with urination, blood in her urine, or fever.  Patient also denies any vaginal discharge.  Past Medical History:  Diagnosis Date   Allergies    Anginal pain ([place])    occas. took ntg last week   Anxiety    Arthritis    Breast cancer ([place]) 02/2017   right breast   Complication of anesthesia    Coronary artery disease    Dyspnea    doe   Family history of breast cancer    Family history of kidney cancer    Family history of leukemia    Family history of lymphoma    Fatty liver    GERD (gastroesophageal reflux disease)    Headache    High cholesterol    Hypertension    Macular degeneration    Myocardial infarction ([place])    5/18   Personal history of radiation therapy    PONV (postoperative nausea and vomiting)    Sleep apnea    no CPAP    Patient Active Problem List   Diagnosis Date Noted   Genetic testing 12/31/2018   Family history of breast cancer    Family history of lymphoma    Family history of leukemia    Family history of kidney cancer    Mild  persistent asthma without complication 11/11/3005   B12 deficiency 02/10/2018   Mixed hyperlipidemia 02/10/2018   Non morbid obesity due to excess calories 02/10/2018   Primary cancer of upper outer quadrant of right female breast ([place]) 03/15/2017   History of right breast cancer 03/14/2017   Coronary artery disease involving native coronary artery of native heart 07/26/2016   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 07/19/2016   Vitamin D deficiency 03/22/2016   Osteopenia of multiple sites 02/17/2016   Anxiety state 01/19/2015   DDD (degenerative disc disease), lumbar 08/10/2013   Lumbar radiculitis 08/10/2013   De Quervain's tenosynovitis, left 08/21/2012   Low back pain radiating to right leg 07/24/2012   CVA 07/08/2009   GOUT, UNSPECIFIED 07/06/2009   Essential hypertension 07/06/2009   PALPITATIONS 07/06/2009    Past Surgical History:  Procedure Laterality Date   ABDOMINAL HYSTERECTOMY     ACHILLES TENDON SURGERY     BREAST BIOPSY Right 03/05/2017   Affirm Bx- invasive mammary   BREAST LUMPECTOMY Right 03/25/2017   invasive mammary, clear margins, LN negative   BUNIONECTOMY Bilateral    CATARACT EXTRACTION W/PHACO Left 01/29/2017   Procedure: CATARACT EXTRACTION PHACO AND  INTRAOCULAR LENS PLACEMENT (IOC);  Surgeon: Birder Robson, MD;  Location: [facility];  Service: Ophthalmology;  Laterality: Left;  [place] 00:49.9 AP% 20.3 CDE 10.11 Fluid Pack lot # [id number]   CATARACT EXTRACTION W/PHACO Right 08/30/2020   Procedure: CATARACT EXTRACTION PHACO AND INTRAOCULAR LENS PLACEMENT (IOC) RIGHT 11.15 01:09.0;  Surgeon: Birder Robson, MD;  Location: [place];  Service: Ophthalmology;  Laterality: Right;  [place] ANGIOPLASTY     STENT 5/18   CORONARY STENT INTERVENTION N/A 07/18/2016   Procedure: Coronary Stent Intervention;  Surgeon: Yolonda Kida, MD;  Location: [place] [facility];  Service: Cardiovascular;  Laterality: N/A;   CTR      INTRAVASCULAR PRESSURE WIRE/FFR STUDY N/A 07/18/2016   Procedure: Intravascular Pressure Wire/FFR Study;  Surgeon: Yolonda Kida, MD;  Location: [place] [facility];  Service: Cardiovascular;  Laterality: N/A;   LEFT HEART CATH AND CORONARY ANGIOGRAPHY N/A 07/18/2016   Procedure: Left Heart Cath and Coronary Angiography;  Surgeon: Yolonda Kida, MD;  Location: [place] [facility];  Service: Cardiovascular;  Laterality: N/A;   PARTIAL MASTECTOMY WITH NEEDLE LOCALIZATION Right 03/25/2017   Procedure: PARTIAL MASTECTOMY WITH NEEDLE LOCALIZATION;  Surgeon: Herbert Pun, MD;  Location: [facility];  Service: General;  Laterality: Right;   SENTINEL NODE BIOPSY Right 03/25/2017   Procedure: SENTINEL NODE BIOPSY;  Surgeon: Herbert Pun, MD;  Location: [facility];  Service: General;  Laterality: Right;   TRIGGER FINGER RELEASE Bilateral     OB History   No obstetric history on file.      Home Medications    Prior to Admission medications   Medication Sig Start Date End Date Taking? Authorizing Provider  albuterol (PROVENTIL) (2.5 MG/3ML) 0.083% nebulizer solution Inhale into the lungs. 04/07/21 04/07/22 Yes [provider]  anastrozole (ARIMIDEX) 1 MG tablet Take 1 tablet (1 mg total) by mouth daily. 10/13/20  Yes Lloyd Huger, MD  aspirin EC 81 MG tablet Take 81 mg by mouth daily.    Yes [provider]  budesonide (PULMICORT) 0.25 MG/2ML nebulizer solution Take 0.25 mg by nebulization 2 (two) times daily as needed.   Yes [provider]  Calcium Carbonate-Simethicone 750-80 MG CHEW Chew 1 tablet by mouth daily as needed (heartburn).   Yes [provider]  Calcium Carbonate-Vitamin D 600-400 MG-UNIT tablet Take 2 tablets by mouth daily.   Yes [provider]  Camphor-Menthol-Capsicum 80-24-16 MG [place] Place 1-3 patches onto the skin daily as needed (pain).    Yes [provider]  cholecalciferol (VITAMIN D) 400 units  TABS tablet Take 400 Units by mouth at bedtime.   Yes [provider]  docusate sodium (COLACE) 100 MG capsule Take 100 mg by mouth daily as needed (for constipation).   Yes [provider]  Evolocumab (REPATHA SURECLICK) 347 MG/ML SOAJ Inject 140 mg into the skin every 14 (fourteen) days. 03/28/21  Yes Minna Merritts, MD  ezetimibe (ZETIA) 10 MG tablet Take 10 mg by mouth daily.   Yes [provider]  ipratropium (ATROVENT) 0.06 % nasal spray Place 2 sprays into both nostrils 4 (four) times daily. 05/26/21  Yes Margarette Canada, NP  METAMUCIL FIBER PO Take by mouth as needed.   Yes [provider]  metoprolol succinate (TOPROL-XL) 25 MG 24 hr tablet Take 12.5 mg by mouth 2 (two) times daily.  08/02/16 08/05/21 Yes [provider]  Multiple Vitamins-Minerals (ICAPS AREDS 2 PO) Take by mouth 2 (two) times daily.  Yes [provider]  nitrofurantoin, macrocrystal-monohydrate, (MACROBID) 100 MG capsule Take 1 capsule (100 mg total) by mouth 2 (two) times daily. 08/05/21  Yes Margarette Canada, NP  nitroGLYCERIN (NITROSTAT) 0.4 MG SL tablet Place 0.4 mg under the tongue every 5 (five) minutes as needed for chest pain.  07/06/16  Yes [provider]  pantoprazole (PROTONIX) 40 MG tablet Take by mouth. 05/12/21 05/12/22 Yes [provider]  PARoxetine (PAXIL) 40 MG tablet Take 40 mg by mouth at bedtime.   Yes [provider]  phenazopyridine (PYRIDIUM) 200 MG tablet Take 1 tablet (200 mg total) by mouth 3 (three) times daily. 08/05/21  Yes Margarette Canada, NP  Polyvinyl Alcohol-Povidone PF 1.4-0.6 % SOLN Place 1-2 drops into both eyes 3 (three) times daily as needed (for dry eyes.).   Yes [provider]  promethazine-dextromethorphan (PROMETHAZINE-DM) 6.25-15 MG/5ML syrup Take 5 mLs by mouth 4 (four) times daily as needed. 05/26/21  Yes Margarette Canada, NP  trolamine salicylate (ASPERCREME) 10 % cream Apply 1 application topically 4 (four)  times daily as needed for muscle pain.   Yes [provider]  UNABLE TO FIND CBD Oil   Yes [provider]  vitamin B-12 (CYANOCOBALAMIN) 1000 MCG tablet Take 1,000 mcg by mouth at bedtime.   Yes [provider]  Vitamin D, Ergocalciferol, (DRISDOL) 1.25 MG (50000 UT) CAPS capsule Take by mouth. 12/29/18  Yes [provider]  benzonatate (TESSALON) 100 MG capsule Take 2 capsules (200 mg total) by mouth every 8 (eight) hours. 05/26/21   Margarette Canada, NP    Family History Family History  Problem Relation Age of Onset   Breast cancer Paternal Aunt        post men.    Breast cancer Cousin    Lymphoma Father    Leukemia Maternal Aunt    Cervical cancer Paternal Grandmother        d. 35s   Breast cancer Cousin        dx 34   Cervical cancer Cousin    Kidney cancer Cousin     Social History Social History   Tobacco Use   Smoking status: Never   Smokeless tobacco: Never  Vaping Use   Vaping Use: Never used  Substance Use Topics   Alcohol use: No   Drug use: No     Allergies   Atorvastatin, Metoprolol, Other, Red yeast rice [cholestin], Statins, and Sulfamethoxazole-trimethoprim   Review of Systems Review of Systems  Constitutional:  Positive for chills. Negative for fever.  Gastrointestinal:  Positive for abdominal pain.  Genitourinary:  Positive for frequency and urgency. Negative for dysuria, hematuria and vaginal discharge.  Musculoskeletal:  Positive for back pain.  Hematological: Negative.   Psychiatric/Behavioral: Negative.       Physical Exam Triage Vital Signs ED Triage Vitals  Enc Vitals Group     BP 08/05/21 1248 130/74     Pulse Rate 08/05/21 1248 74     Resp --      Temp 08/05/21 1248 98.2 F (36.8 C)     Temp Source 08/05/21 1248 Oral     SpO2 08/05/21 1248 96 %     Weight 08/05/21 1245 213 lb (96.6 kg)     Height 08/05/21 1245 '5\' 2"'$  (1.575 m)     Head Circumference --      Peak Flow --      Pain Score 08/05/21  1244 0     Pain Loc --  Pain Edu? --      Excl. in [place]? --    No data found.  Updated Vital Signs BP 130/74 (BP Location: Left Arm)   Pulse 74   Temp 98.2 F (36.8 C) (Oral)   Ht '5\' 2"'$  (1.575 m)   Wt 213 lb (96.6 kg)   SpO2 96%   BMI 38.96 kg/m   Visual Acuity Right Eye Distance:   Left Eye Distance:   Bilateral Distance:    Right Eye Near:   Left Eye Near:    Bilateral Near:     Physical Exam Vitals and nursing note reviewed.  Constitutional:      Appearance: Normal appearance. She is not ill-appearing.  HENT:     Head: Normocephalic and atraumatic.  Cardiovascular:     Rate and Rhythm: Normal rate and regular rhythm.     Pulses: Normal pulses.     Heart sounds: Normal heart sounds. No murmur heard.    No friction rub. No gallop.  Pulmonary:     Effort: Pulmonary effort is normal.     Breath sounds: Normal breath sounds. No wheezing, rhonchi or rales.  Abdominal:     General: Abdomen is flat.     Palpations: Abdomen is soft.     Tenderness: There is no abdominal tenderness. There is no right CVA tenderness or left CVA tenderness.  Skin:    General: Skin is warm and dry.     Capillary Refill: Capillary refill takes less than 2 seconds.     Findings: No erythema or rash.  Neurological:     General: No focal deficit present.     Mental Status: She is alert and oriented to person, place, and time.  Psychiatric:        Mood and Affect: Mood normal.        Behavior: Behavior normal.        Thought Content: Thought content normal.        Judgment: Judgment normal.      UC Treatments / Results  Labs (all labs ordered are listed, but only abnormal results are displayed) Labs Reviewed  URINALYSIS, ROUTINE W REFLEX MICROSCOPIC - Abnormal; Notable for the following components:      Result Value   Specific Gravity, Urine <1.005 (*)    Hgb urine dipstick TRACE (*)    Leukocytes,Ua SMALL (*)    All other components within normal limits  URINALYSIS,  MICROSCOPIC (REFLEX) - Abnormal; Notable for the following components:   Bacteria, UA FEW (*)    All other components within normal limits  URINE CULTURE    EKG   Radiology No results found.  Procedures Procedures (including critical care time)  Medications Ordered in UC Medications - No data to display  Initial Impression / Assessment and Plan / UC Course  I have reviewed the triage vital signs and the nursing notes.  Pertinent labs & imaging results that were available during my care of the patient were reviewed by me and considered in my medical decision making (see chart for details).  Patient is a very pleasant, nontoxic-appearing 28-year-old female here for evaluation of urinary urgency and frequency that started yesterday as outlined HPI above.  Her physical exam reveals a benign cardiopulmonary exam with S1-S2 heart sounds with regular rate and rhythm and lung sounds are clear to auscultation in all fields.  Patient has no CVA tenderness on exam.  Abdomen is soft and nontender.  Patient has been unable to provide a urine specimen  at present so I have given her some water to drink.  I will send urine for analysis.  Urinalysis shows trace hemoglobin and small leukocyte esterase.  No nitrite or protein.  Specific gravity is low at 1.005.  Reflex micro shows greater than 50 WBCs and few bacteria.  WBC clumps are also present.  I will send urine for culture.   Final Clinical Impressions(s) / UC Diagnoses   Final diagnoses:  Lower urinary tract infectious disease     Discharge Instructions      Take the Macrobid twice daily for 5 days with food for treatment of urinary tract infection.  Use the Pyridium every 8 hours as needed for urinary discomfort.  This will turn your urine a bright red-orange.  Increase your oral fluid intake so that you increase your urine production and or flushing your urinary system.  Take an over-the-counter probiotic, such as  Culturelle-Align-Activia, 1 hour after each dose of antibiotic to prevent diarrhea or yeast infections from forming.  We will culture urine and change the antibiotics if necessary.  Return for reevaluation, or see your primary care provider, for any new or worsening symptoms.      ED Prescriptions     Medication Sig Dispense Auth. Provider   nitrofurantoin, macrocrystal-monohydrate, (MACROBID) 100 MG capsule Take 1 capsule (100 mg total) by mouth 2 (two) times daily. 10 capsule Margarette Canada, NP   phenazopyridine (PYRIDIUM) 200 MG tablet Take 1 tablet (200 mg total) by mouth 3 (three) times daily. 6 tablet Margarette Canada, NP      PDMP not reviewed this encounter.   Margarette Canada, NP 08/05/21 1339

## 2021-08-05 NOTE — Discharge Instructions (Addendum)

## 2021-08-05 NOTE — ED Triage Notes (Signed)
Patient presents to UC -- yesterday patient went to urinate and she had chills when trying to urinate -- only urinating very little.  Patient is going more frequently but only urinating a little.  She stated she started wearing depends because she was getting to where she couldn't make it to the bathroom.  Patient took some pills that were similar to AZO.  Patient denies any burning or discharge.

## 2021-08-07 ENCOUNTER — Encounter: Payer: Self-pay | Admitting: *Deleted

## 2021-08-07 LAB — URINE CULTURE: Culture: <10000 — AB

## 2021-08-08 ENCOUNTER — Ambulatory Visit: Payer: Medicare Other | Admitting: Anesthesiology

## 2021-08-08 ENCOUNTER — Encounter
Admission: RE | Disposition: A | Discharge status: Home / Self Care | Payer: Self-pay | Source: Home / Self Care | Attending: Gastroenterology

## 2021-08-08 ENCOUNTER — Ambulatory Visit
Admission: RE | Admit: 2021-08-08 | Discharge: 2021-08-08 | Disposition: A | Discharge status: Home / Self Care | Payer: Medicare Other | Attending: Gastroenterology | Admitting: Gastroenterology

## 2021-08-08 DIAGNOSIS — K219 Gastro-esophageal reflux disease without esophagitis: Secondary | ICD-10-CM | POA: Insufficient documentation

## 2021-08-08 DIAGNOSIS — D123 Benign neoplasm of transverse colon: Secondary | ICD-10-CM | POA: Insufficient documentation

## 2021-08-08 DIAGNOSIS — Z6838 Body mass index (BMI) 38.0-38.9, adult: Secondary | ICD-10-CM | POA: Insufficient documentation

## 2021-08-08 DIAGNOSIS — Z1211 Encounter for screening for malignant neoplasm of colon: Secondary | ICD-10-CM | POA: Diagnosis present

## 2021-08-08 DIAGNOSIS — Z951 Presence of aortocoronary bypass graft: Secondary | ICD-10-CM | POA: Insufficient documentation

## 2021-08-08 DIAGNOSIS — I10 Essential (primary) hypertension: Secondary | ICD-10-CM | POA: Insufficient documentation

## 2021-08-08 DIAGNOSIS — Z86010 Personal history of colonic polyps: Secondary | ICD-10-CM | POA: Diagnosis not present

## 2021-08-08 DIAGNOSIS — K640 First degree hemorrhoids: Secondary | ICD-10-CM | POA: Diagnosis not present

## 2021-08-08 DIAGNOSIS — I2510 Atherosclerotic heart disease of native coronary artery without angina pectoris: Secondary | ICD-10-CM | POA: Insufficient documentation

## 2021-08-08 DIAGNOSIS — G4730 Sleep apnea, unspecified: Secondary | ICD-10-CM | POA: Diagnosis not present

## 2021-08-08 DIAGNOSIS — F419 Anxiety disorder, unspecified: Secondary | ICD-10-CM | POA: Diagnosis not present

## 2021-08-08 DIAGNOSIS — J45909 Unspecified asthma, uncomplicated: Secondary | ICD-10-CM | POA: Diagnosis not present

## 2021-08-08 DIAGNOSIS — R1013 Epigastric pain: Secondary | ICD-10-CM | POA: Diagnosis not present

## 2021-08-08 HISTORY — DX: Unspecified glaucoma: H40.9

## 2021-08-08 HISTORY — DX: Unspecified asthma, uncomplicated: J45.909

## 2021-08-08 HISTORY — PX: ESOPHAGOGASTRODUODENOSCOPY (EGD) WITH PROPOFOL: SHX5813

## 2021-08-08 HISTORY — PX: COLONOSCOPY WITH PROPOFOL: SHX5780

## 2021-08-08 SURGERY — COLONOSCOPY WITH PROPOFOL
Anesthesia: General

## 2021-08-08 MED ORDER — PROPOFOL 500 MG/50ML IV EMUL
INTRAVENOUS | Status: AC
  Filled 2021-08-08: qty 50

## 2021-08-08 MED ORDER — SODIUM CHLORIDE 0.9 % IV SOLN
INTRAVENOUS | Status: DC
  Administered 2021-08-08: 20 mL/h via INTRAVENOUS

## 2021-08-08 MED ORDER — DEXMEDETOMIDINE HCL IN NACL 80 MCG/20ML IV SOLN
INTRAVENOUS | Status: AC
  Filled 2021-08-08: qty 20

## 2021-08-08 MED ORDER — LIDOCAINE HCL (PF) 2 % IJ SOLN
INTRAMUSCULAR | Status: AC
Start: 2021-08-08 — End: ?
  Filled 2021-08-08: qty 5

## 2021-08-08 MED ORDER — PROPOFOL 10 MG/ML IV BOLUS
INTRAVENOUS | Status: DC | PRN
  Administered 2021-08-08 (×3): 20 mg via INTRAVENOUS
  Administered 2021-08-08: 40 mg via INTRAVENOUS

## 2021-08-08 MED ORDER — PROPOFOL 500 MG/50ML IV EMUL
INTRAVENOUS | Status: DC | PRN
Start: 2021-08-08 — End: 2021-08-08
  Administered 2021-08-08: 120 ug/kg/min via INTRAVENOUS

## 2021-08-08 MED ORDER — DEXMEDETOMIDINE HCL IN NACL 200 MCG/50ML IV SOLN
INTRAVENOUS | Status: DC | PRN
  Administered 2021-08-08: 8 ug via INTRAVENOUS

## 2021-08-08 MED ORDER — LIDOCAINE HCL (CARDIAC) PF 100 MG/5ML IV SOSY
PREFILLED_SYRINGE | INTRAVENOUS | Status: DC | PRN
  Administered 2021-08-08: 100 mg via INTRAVENOUS

## 2021-08-08 NOTE — Anesthesia Preprocedure Evaluation (Signed)
Anesthesia Evaluation  Patient identified by MRN, date of birth, ID band Patient awake    Reviewed: Allergy & Precautions, NPO status , Patient's Chart, lab work & pertinent test results  Airway Mallampati: II  TM Distance: >3 FB Neck ROM: Full    Dental  (+) Teeth Intact   Pulmonary neg pulmonary ROS, shortness of breath and with exertion, asthma , sleep apnea ,    Pulmonary exam normal  + decreased breath sounds      Cardiovascular Exercise Tolerance: Good hypertension, Pt. on medications + CAD, + Cardiac Stents and + DOE  negative cardio ROS Normal cardiovascular exam Rhythm:Regular     Neuro/Psych  Headaches, Anxiety negative neurological ROS  negative psych ROS   GI/Hepatic negative GI ROS, Neg liver ROS, GERD  ,  Endo/Other  negative endocrine ROSMorbid obesity  Renal/GU negative Renal ROS  negative genitourinary   Musculoskeletal   Abdominal (+) + obese,   Peds negative pediatric ROS (+)  Hematology negative hematology ROS (+)   Anesthesia Other Findings Past Medical History: No date: Allergies No date: Anginal pain ([place])     Comment:  occas. took ntg last week No date: Anxiety No date: Arthritis No date: Asthma 02/2017: Breast cancer ([place])     Comment:  right breast No date: Complication of anesthesia No date: Coronary artery disease No date: Dyspnea     Comment:  doe No date: Family history of breast cancer No date: Family history of kidney cancer No date: Family history of leukemia No date: Family history of lymphoma No date: Fatty liver No date: GERD (gastroesophageal reflux disease) No date: Glaucoma No date: Headache No date: High cholesterol No date: Hypertension No date: Macular degeneration No date: Myocardial infarction [facility])     Comment:  5/18 No date: Personal history of radiation therapy No date: PONV (postoperative nausea and vomiting) No date: Sleep apnea     Comment:  no  CPAP  Past Surgical History: No date: ABDOMINAL HYSTERECTOMY No date: ACHILLES TENDON SURGERY 03/05/2017: BREAST BIOPSY; Right     Comment:  Affirm Bx- invasive mammary 03/25/2017: BREAST LUMPECTOMY; Right     Comment:  invasive mammary, clear margins, LN negative No date: BUNIONECTOMY; Bilateral 01/29/2017: CATARACT EXTRACTION W/PHACO; Left     Comment:  Procedure: CATARACT EXTRACTION PHACO AND INTRAOCULAR               LENS PLACEMENT (IOC);  Surgeon: Birder Robson, MD;                Location: [facility] ORS;  Service: Ophthalmology;  Laterality:              Left;  [place] 00:49.9 AP% 20.3 CDE 10.11 Fluid Pack lot #               [id number] 08/30/2020: CATARACT EXTRACTION W/PHACO; Right     Comment:  Procedure: CATARACT EXTRACTION PHACO AND INTRAOCULAR               LENS PLACEMENT (IOC) RIGHT 11.15 01:09.0;  Surgeon:               Birder Robson, MD;  Location: [place];                Service: Ophthalmology;  Laterality: Right;  [place] No date: CORONARY ANGIOPLASTY     Comment:  STENT 5/18 07/18/2016: CORONARY STENT INTERVENTION; N/A     Comment:  Procedure: Coronary Stent Intervention;  Surgeon:  Callwood, Dwayne D, MD;  Location: [place] CV LAB;               Service: Cardiovascular;  Laterality: N/A; No date: CTR 07/18/2016: INTRAVASCULAR PRESSURE WIRE/FFR STUDY; N/A     Comment:  Procedure: Intravascular Pressure Wire/FFR Study;                Surgeon: Yolonda Kida, MD;  Location: [place]              CV LAB;  Service: Cardiovascular;  Laterality: N/A; 07/18/2016: LEFT HEART CATH AND CORONARY ANGIOGRAPHY; N/A     Comment:  Procedure: Left Heart Cath and Coronary Angiography;                Surgeon: Yolonda Kida, MD;  Location: [place]              CV LAB;  Service: Cardiovascular;  Laterality: N/A; No date: MASTECTOMY; Right     Comment:  partial 03/25/2017: PARTIAL MASTECTOMY WITH NEEDLE LOCALIZATION; Right      Comment:  Procedure: PARTIAL MASTECTOMY WITH NEEDLE LOCALIZATION;               Surgeon: Herbert Pun, MD;  Location: [facility] ORS;               Service: General;  Laterality: Right; 03/25/2017: SENTINEL NODE BIOPSY; Right     Comment:  Procedure: SENTINEL NODE BIOPSY;  Surgeon: Herbert Pun, MD;  Location: [facility] ORS;  Service: General;                Laterality: Right; No date: TRIGGER FINGER RELEASE; Bilateral     Reproductive/Obstetrics negative OB ROS                             Anesthesia Physical Anesthesia Plan  ASA: 3  Anesthesia Plan: General   Post-op Pain Management:    Induction: Intravenous  PONV Risk Score and Plan: Propofol infusion and TIVA  Airway Management Planned: Natural Airway  Additional Equipment:   Intra-op Plan:   Post-operative Plan:   Informed Consent: I have reviewed the patients History and Physical, chart, labs and discussed the procedure including the risks, benefits and alternatives for the proposed anesthesia with the patient or authorized representative who has indicated his/her understanding and acceptance.     Dental Advisory Given  Plan Discussed with: CRNA and Surgeon  Anesthesia Plan Comments:         Anesthesia Quick Evaluation

## 2021-08-08 NOTE — Interval H&P Note (Signed)
History and Physical Interval Note:  08/08/2021 10:19 AM  Linda Yang  has presented today for surgery, with the diagnosis of GERD,DYSPHAGIA,PERSONAL HX OF COLON POLYPS,LLQ PAIN.  The various methods of treatment have been discussed with the patient and family. After consideration of risks, benefits and other options for treatment, the patient has consented to  Procedure(s): COLONOSCOPY WITH PROPOFOL (N/A) ESOPHAGOGASTRODUODENOSCOPY (EGD) WITH PROPOFOL (N/A) as a surgical intervention.  The patient's history has been reviewed, patient examined, no change in status, stable for surgery.  I have reviewed the patient's chart and labs.  Questions were answered to the patient's satisfaction.     Lesly Rubenstein  Ok to proceed with EGD/Colonoscopy

## 2021-08-08 NOTE — H&P (Signed)
Outpatient short stay form Pre-procedure 08/08/2021  Linda Rubenstein, MD  Primary Physician: Dion Body, MD  Reason for visit:  Epigastric pain/Dysphagia/Surveillance  History of present illness:    81 y/o lady with history of CAD, OSA, and obesity here for EGD/Colonoscopy for dysphagia, epigastric pain, and history of tubular adenoma. No blood thinners besides aspirin. History of hysterectomy. No family history of GI malignancies.     Current Facility-Administered Medications:    0.9 %  sodium chloride infusion, , Intravenous, Continuous, [place], Hilton Cork, MD, Last Rate: 20 mL/hr at 08/08/21 0948, 20 mL/hr at 08/08/21 0948  Medications Prior to Admission  Medication Sig Dispense Refill Last Dose   albuterol (PROVENTIL) (2.5 MG/3ML) 0.083% nebulizer solution Inhale into the lungs.   Past Week   anastrozole (ARIMIDEX) 1 MG tablet Take 1 tablet (1 mg total) by mouth daily. 90 tablet 3 Past Week   aspirin EC 81 MG tablet Take 81 mg by mouth daily.    Past Week   benzonatate (TESSALON) 100 MG capsule Take 2 capsules (200 mg total) by mouth every 8 (eight) hours. 21 capsule 0 Past Week   budesonide (PULMICORT) 0.25 MG/2ML nebulizer solution Take 0.25 mg by nebulization 2 (two) times daily as needed.   Past Week   Calcium Carbonate-Simethicone 750-80 MG CHEW Chew 1 tablet by mouth daily as needed (heartburn).   Past Week   Calcium Carbonate-Vitamin D 600-400 MG-UNIT tablet Take 2 tablets by mouth daily.   Past Week   Camphor-Menthol-Capsicum 80-24-16 MG PTCH Place 1-3 patches onto the skin daily as needed (pain).    Past Week   cholecalciferol (VITAMIN D) 400 units TABS tablet Take 400 Units by mouth at bedtime.   Past Week   docusate sodium (COLACE) 100 MG capsule Take 100 mg by mouth daily as needed (for constipation).   Past Week   Evolocumab (REPATHA SURECLICK) 856 MG/ML SOAJ Inject 140 mg into the skin every 14 (fourteen) days. 2 mL 6 Past Week   ezetimibe (ZETIA) 10 MG  tablet Take 10 mg by mouth daily.   Past Week   ipratropium (ATROVENT) 0.06 % nasal spray Place 2 sprays into both nostrils 4 (four) times daily. 15 mL 12 Past Week   METAMUCIL FIBER PO Take by mouth as needed.   Past Week   Multiple Vitamins-Minerals (ICAPS AREDS 2 PO) Take by mouth 2 (two) times daily.   Past Week   nitrofurantoin, macrocrystal-monohydrate, (MACROBID) 100 MG capsule Take 1 capsule (100 mg total) by mouth 2 (two) times daily. 10 capsule 0 Past Week   nitroGLYCERIN (NITROSTAT) 0.4 MG SL tablet Place 0.4 mg under the tongue every 5 (five) minutes as needed for chest pain.    Past Month   pantoprazole (PROTONIX) 40 MG tablet Take by mouth.   Past Week   PARoxetine (PAXIL) 40 MG tablet Take 40 mg by mouth at bedtime.   Past Week   phenazopyridine (PYRIDIUM) 200 MG tablet Take 1 tablet (200 mg total) by mouth 3 (three) times daily. 6 tablet 0 Past Week   Polyvinyl Alcohol-Povidone PF 1.4-0.6 % SOLN Place 1-2 drops into both eyes 3 (three) times daily as needed (for dry eyes.).   Past Week   promethazine-dextromethorphan (PROMETHAZINE-DM) 6.25-15 MG/5ML syrup Take 5 mLs by mouth 4 (four) times daily as needed. 118 mL 0 Past Week   trolamine salicylate (ASPERCREME) 10 % cream Apply 1 application topically 4 (four) times daily as needed for muscle pain.   Past Week  UNABLE TO FIND CBD Oil   Past Week   vitamin B-12 (CYANOCOBALAMIN) 1000 MCG tablet Take 1,000 mcg by mouth at bedtime.   Past Week   Vitamin D, Ergocalciferol, (DRISDOL) 1.25 MG (50000 UT) CAPS capsule Take by mouth.   Past Week   metoprolol succinate (TOPROL-XL) 25 MG 24 hr tablet Take 12.5 mg by mouth 2 (two) times daily.         Allergies  Allergen Reactions   Atorvastatin Other (See Comments)    Leg cramps.   Brilinta [Ticagrelor]    Erythromycin Diarrhea   Metoprolol Other (See Comments)    Hypotension    Other Diarrhea and Other (See Comments)    All mycin antibiotics    Red Yeast Rice [Cholestin] Other (See  Comments)    GI discomfort    Statins Other (See Comments)    Myalgia, cramps    Sulfamethoxazole-Trimethoprim Other (See Comments)     Past Medical History:  Diagnosis Date   Allergies    Anginal pain ([place])    occas. took ntg last week   Anxiety    Arthritis    Asthma    Breast cancer ([place]) 02/2017   right breast   Complication of anesthesia    Coronary artery disease    Dyspnea    doe   Family history of breast cancer    Family history of kidney cancer    Family history of leukemia    Family history of lymphoma    Fatty liver    GERD (gastroesophageal reflux disease)    Glaucoma    Headache    High cholesterol    Hypertension    Macular degeneration    Myocardial infarction ([place])    5/18   Personal history of radiation therapy    PONV (postoperative nausea and vomiting)    Sleep apnea    no CPAP    Review of systems:  Otherwise negative.    Physical Exam  Gen: Alert, oriented. Appears stated age.  HEENT: PERRLA. Lungs: No respiratory distress CV: RRR Abd: soft, benign, no masses Ext: No edema    Planned procedures: Proceed with EGD/colonoscopy. The patient understands the nature of the planned procedure, indications, risks, alternatives and potential complications including but not limited to bleeding, infection, perforation, damage to internal organs and possible oversedation/side effects from anesthesia. The patient agrees and gives consent to proceed.  Please refer to procedure notes for findings, recommendations and patient disposition/instructions.     Linda Rubenstein, MD [facility]

## 2021-08-08 NOTE — Anesthesia Postprocedure Evaluation (Signed)
Anesthesia Post Note  Patient: Linda Yang  Procedure(s) Performed: COLONOSCOPY WITH PROPOFOL ESOPHAGOGASTRODUODENOSCOPY (EGD) WITH PROPOFOL  Patient location during evaluation: PACU Anesthesia Type: General Level of consciousness: awake and oriented Pain management: pain level controlled Vital Signs Assessment: post-procedure vital signs reviewed and stable Respiratory status: spontaneous breathing and respiratory function stable Anesthetic complications: no   No notable events documented.   Last Vitals:  Vitals:   08/08/21 1054 08/08/21 1104  BP: 127/66 (!) 180/84  Pulse: 95 94  Resp: 15 15  Temp: (!) 36.2 C   SpO2: 96% 97%    Last Pain:  Vitals:   08/08/21 1104  TempSrc:   PainSc: 0-No pain                 VAN STAVEREN,Tatyanna Cronk

## 2021-08-08 NOTE — Op Note (Signed)
[facility] Patient Name: Linda Yang Procedure Date: 08/08/2021 10:10 AM MRN: [id number] Account #: [id number] Date of Birth: 1940-03-12 Admit Type: Outpatient Age: 81 Room: [facility] ENDO ROOM 1 Gender: Female Note Status: Finalized Instrument Name: Upper Endoscope [id number] Procedure:             Upper GI endoscopy Indications:           Epigastric abdominal pain, Gastro-esophageal reflux                         disease Providers:             Andrey Farmer MD, MD Referring MD:          Dion Body (Referring MD) Medicines:             Monitored Anesthesia Care Complications:         No immediate complications. Estimated blood loss:                         Minimal. Procedure:             Pre-Anesthesia Assessment:                        - Prior to the procedure, a History and Physical was                         performed, and patient medications and allergies were                         reviewed. The patient is competent. The risks and                         benefits of the procedure and the sedation options and                         risks were discussed with the patient. All questions                         were answered and informed consent was obtained.                         Patient identification and proposed procedure were                         verified by the physician, the nurse, the                         anesthesiologist, the anesthetist and the technician                         in the endoscopy suite. Mental Status Examination:                         alert and oriented. Airway Examination: normal                         oropharyngeal airway and neck mobility. Respiratory  Examination: clear to auscultation. CV Examination:                         normal. Prophylactic Antibiotics: The patient does not                         require prophylactic antibiotics. Prior                          Anticoagulants: The patient has taken no previous                         anticoagulant or antiplatelet agents. ASA Grade                         Assessment: III - A patient with severe systemic                         disease. After reviewing the risks and benefits, the                         patient was deemed in satisfactory condition to                         undergo the procedure. The anesthesia plan was to use                         monitored anesthesia care (MAC). Immediately prior to                         administration of medications, the patient was                         re-assessed for adequacy to receive sedatives. The                         heart rate, respiratory rate, oxygen saturations,                         blood pressure, adequacy of pulmonary ventilation, and                         response to care were monitored throughout the                         procedure. The physical status of the patient was                         re-assessed after the procedure.                        After obtaining informed consent, the endoscope was                         passed under direct vision. Throughout the procedure,                         the patient's blood pressure, pulse, and oxygen  saturations were monitored continuously. The                         Endosonoscope was introduced through the mouth, and                         advanced to the second part of duodenum. The upper GI                         endoscopy was accomplished without difficulty. The                         patient tolerated the procedure well. Findings:      The examined esophagus was normal.      The entire examined stomach was normal. Biopsies were taken with a cold       forceps for Helicobacter pylori testing. Estimated blood loss was       minimal.      The examined duodenum was normal. Impression:            - Normal esophagus.                        - Normal  stomach. Biopsied.                        - Normal examined duodenum. Recommendation:        - Discharge patient to home.                        - Resume previous diet.                        - Continue present medications.                        - Await pathology results.                        - Return to referring physician as previously                         scheduled. Procedure Code(s):     --- Professional ---                        [phone], Esophagogastroduodenoscopy, flexible,                         transoral; with biopsy, single or multiple Diagnosis Code(s):     --- Professional ---                        R10.13, Epigastric pain                        K21.9, Gastro-esophageal reflux disease without                         esophagitis CPT copyright 2019 [employer]. All rights reserved. The codes documented in this report are preliminary and upon coder review may  be revised to meet current compliance requirements. Andrey Farmer MD, MD 08/08/2021 10:51:11 AM Number of  Addenda: 0 Note Initiated On: 08/08/2021 10:10 AM Estimated Blood Loss:  Estimated blood loss was minimal.      [facility]

## 2021-08-08 NOTE — Transfer of Care (Addendum)
Immediate Anesthesia Transfer of Care Note  Patient: Linda Yang  Procedure(s) Performed: COLONOSCOPY WITH PROPOFOL ESOPHAGOGASTRODUODENOSCOPY (EGD) WITH PROPOFOL  Patient Location: PACU and Endoscopy Unit  Anesthesia Type:General  Level of Consciousness: drowsy  Airway & Oxygen Therapy: Patient Spontanous Breathing  Post-op Assessment: Report given to RN  Post vital signs: stable  Last Vitals:  Vitals Value Taken Time  BP    Temp    Pulse    Resp    SpO2      Last Pain:  Vitals:   08/08/21 0951  TempSrc: Temporal  PainSc: 0-No pain         Complications: No notable events documented.

## 2021-08-08 NOTE — Op Note (Signed)
[facility] Gastroenterology Patient Name: Linda Yang Procedure Date: 08/08/2021 10:09 AM MRN: [id number] Account #: [id number] Date of Birth: Jun 30, 1940 Admit Type: Outpatient Age: 81 Room: [facility] ENDO ROOM 1 Gender: Female Note Status: Finalized Instrument Name: Jasper Riling [id number] Procedure:             Colonoscopy Indications:           Surveillance: Personal history of adenomatous polyps                         on last colonoscopy > 5 years ago Providers:             Andrey Farmer MD, MD Referring MD:          Dion Body (Referring MD) Medicines:             Monitored Anesthesia Care Complications:         No immediate complications. Estimated blood loss:                         Minimal. Procedure:             Pre-Anesthesia Assessment:                        - Prior to the procedure, a History and Physical was                         performed, and patient medications and allergies were                         reviewed. The patient is competent. The risks and                         benefits of the procedure and the sedation options and                         risks were discussed with the patient. All questions                         were answered and informed consent was obtained.                         Patient identification and proposed procedure were                         verified by the physician, the nurse, the                         anesthesiologist, the anesthetist and the technician                         in the endoscopy suite. Mental Status Examination:                         alert and oriented. Airway Examination: normal                         oropharyngeal airway and neck mobility. Respiratory  Examination: clear to auscultation. CV Examination:                         normal. Prophylactic Antibiotics: The patient does not                         require prophylactic antibiotics. Prior                          Anticoagulants: The patient has taken no previous                         anticoagulant or antiplatelet agents. ASA Grade                         Assessment: III - A patient with severe systemic                         disease. After reviewing the risks and benefits, the                         patient was deemed in satisfactory condition to                         undergo the procedure. The anesthesia plan was to use                         monitored anesthesia care (MAC). Immediately prior to                         administration of medications, the patient was                         re-assessed for adequacy to receive sedatives. The                         heart rate, respiratory rate, oxygen saturations,                         blood pressure, adequacy of pulmonary ventilation, and                         response to care were monitored throughout the                         procedure. The physical status of the patient was                         re-assessed after the procedure.                        After obtaining informed consent, the colonoscope was                         passed under direct vision. Throughout the procedure,                         the patient's blood pressure, pulse, and oxygen  saturations were monitored continuously. The                         Colonoscope was introduced through the anus and                         advanced to the the terminal ileum. The colonoscopy                         was performed without difficulty. The patient                         tolerated the procedure well. The quality of the bowel                         preparation was good. Findings:      The perianal and digital rectal examinations were normal.      The terminal ileum appeared normal.      A 2 mm polyp was found in the transverse colon. The polyp was sessile.       The polyp was removed with a jumbo cold forceps. Resection and retrieval        were complete. Estimated blood loss was minimal.      A 3 mm polyp was found in the transverse colon. The polyp was sessile.       The polyp was removed with a cold snare. Resection and retrieval were       complete. Estimated blood loss was minimal.      Internal hemorrhoids were found during retroflexion. The hemorrhoids       were Grade I (internal hemorrhoids that do not prolapse).      The exam was otherwise without abnormality on direct and retroflexion       views. Impression:            - The examined portion of the ileum was normal.                        - One 2 mm polyp in the transverse colon, removed with                         a jumbo cold forceps. Resected and retrieved.                        - One 3 mm polyp in the transverse colon, removed with                         a cold snare. Resected and retrieved.                        - Internal hemorrhoids.                        - The examination was otherwise normal on direct and                         retroflexion views. Recommendation:        - Discharge patient to home.                        -  Resume previous diet.                        - Continue present medications.                        - Await pathology results.                        - Repeat colonoscopy is not recommended due to current                         age (52 years or older) for surveillance.                        - Return to referring physician as previously                         scheduled. Procedure Code(s):     --- Professional ---                        [phone], Colonoscopy, flexible; with removal of                         tumor(s), polyp(s), or other lesion(s) by snare                         technique                        45380, 58, Colonoscopy, flexible; with biopsy, single                         or multiple Diagnosis Code(s):     --- Professional ---                        Z86.010, Personal history of colonic polyps                         K64.0, First degree hemorrhoids                        K63.5, Polyp of colon CPT copyright 2019 American Medical Association. All rights reserved. The codes documented in this report are preliminary and upon coder review may  be revised to meet current compliance requirements. Andrey Farmer MD, MD 08/08/2021 10:53:52 AM Number of Addenda: 0 Note Initiated On: 08/08/2021 10:09 AM Scope Withdrawal Time: 0 hours 11 minutes 2 seconds  Total Procedure Duration: 0 hours 14 minutes 39 seconds  Estimated Blood Loss:  Estimated blood loss was minimal.      [facility]

## 2021-08-09 ENCOUNTER — Encounter: Payer: Self-pay | Admitting: Gastroenterology

## 2021-08-11 LAB — SURGICAL PATHOLOGY

## 2021-08-29 ENCOUNTER — Telehealth: Payer: Self-pay

## 2021-08-29 NOTE — Telephone Encounter (Signed)
Prior Auth initiated for Repatha per fax received from [email or web address] KEY: [id number] Response: Request Reference Number: [id number]. REPATHA SURE INJ '140MG'$ /ML is approved through 02/18/2022. Your patient may now fill this prescription and it will be covered.

## 2021-09-04 ENCOUNTER — Inpatient Hospital Stay: Payer: Medicare Other | Attending: Oncology | Admitting: Oncology

## 2021-09-04 ENCOUNTER — Encounter: Payer: Self-pay | Admitting: Oncology

## 2021-09-04 VITALS — BP 128/74 | HR 81 | Temp 98.7°F | Resp 20 | Wt 220.1 lb

## 2021-09-04 DIAGNOSIS — Z170 Estrogen receptor positive status [ER+]: Secondary | ICD-10-CM | POA: Insufficient documentation

## 2021-09-04 DIAGNOSIS — M8580 Other specified disorders of bone density and structure, unspecified site: Secondary | ICD-10-CM | POA: Insufficient documentation

## 2021-09-04 DIAGNOSIS — C50411 Malignant neoplasm of upper-outer quadrant of right female breast: Secondary | ICD-10-CM | POA: Diagnosis present

## 2021-09-04 DIAGNOSIS — Z79811 Long term (current) use of aromatase inhibitors: Secondary | ICD-10-CM | POA: Insufficient documentation

## 2021-09-04 DIAGNOSIS — M79621 Pain in right upper arm: Secondary | ICD-10-CM | POA: Diagnosis not present

## 2021-09-04 DIAGNOSIS — M8589 Other specified disorders of bone density and structure, multiple sites: Secondary | ICD-10-CM | POA: Diagnosis not present

## 2021-09-04 NOTE — Progress Notes (Signed)
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  INTERVAL HISTORY: Linda Yang is an 81 year old female who presents today with her husband for follow-up.  She has been compliant with her anastrozole.  Reports taking 2 and 1 day and had horrible hot flashes.  Also reports some concerns with an elevated blood pressure and has spoke with her PCP but has concerns it is related to her anastrozole.  Reports right axillary tenderness she noticed last week although thinks it is just "fat".  Has not had a mammogram since January 2022 due to being sick with COVID from December to March of this year.  Having some issues with left leg numbness and tingling thought to be due to sciatica.  Feeling slightly more anxious than usual and unsure if it is a medication she is taking.  Feeling more tired than usual.  REVIEW OF SYSTEMS:   Review of Systems  Constitutional:  Positive for malaise/fatigue. Negative for fever and weight loss.  Respiratory: Negative.  Negative for cough and shortness of breath.   Cardiovascular: Negative.  Negative for chest pain and leg swelling.  Gastrointestinal: Negative.  Negative for abdominal pain and constipation.  Genitourinary: Negative.  Negative for dysuria.  Musculoskeletal:  Positive for back pain.  Skin: Negative.  Negative for rash.  Neurological:  Positive for sensory change and weakness. Negative for dizziness, focal weakness and headaches.  Psychiatric/Behavioral:  The patient is nervous/anxious.     As  per HPI. Otherwise, a complete review of systems is negative.  PAST MEDICAL HISTORY: Past Medical History:  Diagnosis Date   Allergies    Anginal pain ([place])    occas. took ntg last week   Anxiety    Arthritis    Asthma    Breast cancer ([place]) 02/2017   right breast   Complication of anesthesia    Coronary artery disease    Dyspnea    doe   Family history of breast cancer    Family history of kidney cancer    Family history of leukemia    Family history of lymphoma    Fatty liver    GERD (gastroesophageal reflux disease)    Glaucoma    Headache    High cholesterol    Hypertension    Macular degeneration    Myocardial infarction ([place])    5/18   Personal history of radiation therapy    PONV (postoperative nausea and vomiting)    Sleep apnea    no CPAP    PAST SURGICAL HISTORY: Past Surgical History:  Procedure Laterality Date   ABDOMINAL HYSTERECTOMY     ACHILLES TENDON SURGERY     BREAST BIOPSY Right 03/05/2017   Affirm Bx- invasive mammary   BREAST LUMPECTOMY Right 03/25/2017   invasive mammary, clear margins, LN negative   BUNIONECTOMY Bilateral    CATARACT EXTRACTION W/PHACO Left 01/29/2017   Procedure: CATARACT EXTRACTION PHACO AND INTRAOCULAR LENS PLACEMENT ([place]);  Surgeon: Birder Robson, MD;  Location: [facility] ORS;  Service: Ophthalmology;  Laterality: Left;  [place] 00:49.9 AP% 20.3 CDE 10.11 Fluid Pack lot # [id number]   CATARACT EXTRACTION  W/PHACO Right 08/30/2020   Procedure: CATARACT EXTRACTION PHACO AND INTRAOCULAR LENS PLACEMENT ([facility]) RIGHT 11.15 01:09.0;  Surgeon: Birder Robson, MD;  Location: [place];  Service: Ophthalmology;  Laterality: Right;  [place]   COLONOSCOPY WITH PROPOFOL N/A 08/08/2021   Procedure: COLONOSCOPY WITH PROPOFOL;  Surgeon: Lesly Rubenstein, MD;  Location: [facility] ENDOSCOPY;  Service: Endoscopy;  Laterality: N/A;   CORONARY ANGIOPLASTY     STENT 5/18   CORONARY STENT INTERVENTION N/A 07/18/2016   Procedure: Coronary  Stent Intervention;  Surgeon: Yolonda Kida, MD;  Location: [place] CV LAB;  Service: Cardiovascular;  Laterality: N/A;   CTR     ESOPHAGOGASTRODUODENOSCOPY (EGD) WITH PROPOFOL N/A 08/08/2021   Procedure: ESOPHAGOGASTRODUODENOSCOPY (EGD) WITH PROPOFOL;  Surgeon: Lesly Rubenstein, MD;  Location: [facility] ENDOSCOPY;  Service: Endoscopy;  Laterality: N/A;   INTRAVASCULAR PRESSURE WIRE/FFR STUDY N/A 07/18/2016   Procedure: Intravascular Pressure Wire/FFR Study;  Surgeon: Yolonda Kida, MD;  Location: [place] CV LAB;  Service: Cardiovascular;  Laterality: N/A;   LEFT HEART CATH AND CORONARY ANGIOGRAPHY N/A 07/18/2016   Procedure: Left Heart Cath and Coronary Angiography;  Surgeon: Yolonda Kida, MD;  Location: [place] CV LAB;  Service: Cardiovascular;  Laterality: N/A;   MASTECTOMY Right    partial   PARTIAL MASTECTOMY WITH NEEDLE LOCALIZATION Right 03/25/2017   Procedure: PARTIAL MASTECTOMY WITH NEEDLE LOCALIZATION;  Surgeon: Herbert Pun, MD;  Location: [facility] ORS;  Service: General;  Laterality: Right;   SENTINEL NODE BIOPSY Right 03/25/2017   Procedure: SENTINEL NODE BIOPSY;  Surgeon: Herbert Pun, MD;  Location: [facility] ORS;  Service: General;  Laterality: Right;   TRIGGER FINGER RELEASE Bilateral     FAMILY HISTORY: Family History  Problem Relation Age of Onset   Breast cancer Paternal Aunt        post men.    Breast cancer Cousin    Lymphoma Father    Leukemia Maternal Aunt    Cervical cancer Paternal Grandmother        d. 27s   Breast cancer Cousin        dx 34   Cervical cancer Cousin    Kidney cancer Cousin     ADVANCED DIRECTIVES (Y/N):  N  HEALTH MAINTENANCE: Social History   Tobacco Use   Smoking status: Never   Smokeless tobacco: Never  Vaping Use   Vaping Use: Never used  Substance Use Topics   Alcohol use: No   Drug use: No     Colonoscopy:  PAP:  Bone density:  Lipid panel:  Allergies  Allergen Reactions    Atorvastatin Other (See Comments)    Leg cramps.   Brilinta [Ticagrelor]    Erythromycin Diarrhea   Metoprolol Other (See Comments)    Hypotension    Other Diarrhea and Other (See Comments)    All mycin antibiotics    Red Yeast Rice [Cholestin] Other (See Comments)    GI discomfort    Statins Other (See Comments)    Myalgia, cramps    Sulfamethoxazole-Trimethoprim Other (See Comments)    Current Outpatient Medications  Medication Sig Dispense Refill   albuterol (PROVENTIL) (2.5 MG/3ML) 0.083% nebulizer solution Inhale into the lungs.     anastrozole (ARIMIDEX) 1 MG tablet Take 1 tablet (1 mg total) by mouth daily. 90 tablet 3   aspirin EC 81 MG tablet Take 81 mg by mouth daily.      benzonatate (TESSALON) 100 MG capsule Take 2 capsules (200 mg total) by mouth every 8 (  eight) hours. 21 capsule 0   budesonide (PULMICORT) 0.25 MG/2ML nebulizer solution Take 0.25 mg by nebulization 2 (two) times daily as needed.     Calcium Carbonate-Simethicone 750-80 MG CHEW Chew 1 tablet by mouth daily as needed (heartburn).     Calcium Carbonate-Vitamin D 600-400 MG-UNIT tablet Take 2 tablets by mouth daily.     Camphor-Menthol-Capsicum 80-24-16 MG PTCH Place 1-3 patches onto the skin daily as needed (pain).      cholecalciferol (VITAMIN D) 400 units TABS tablet Take 400 Units by mouth at bedtime.     docusate sodium (COLACE) 100 MG capsule Take 100 mg by mouth daily as needed (for constipation).     Evolocumab (REPATHA SURECLICK) 140 MG/ML SOAJ Inject 140 mg into the skin every 14 (fourteen) days. 2 mL 6   ezetimibe (ZETIA) 10 MG tablet Take 10 mg by mouth daily.     ipratropium (ATROVENT) 0.06 % nasal spray Place 2 sprays into both nostrils 4 (four) times daily. 15 mL 12   METAMUCIL FIBER PO Take by mouth as needed.     Multiple Vitamins-Minerals (ICAPS AREDS 2 PO) Take by mouth 2 (two) times daily.     nitrofurantoin, macrocrystal-monohydrate, (MACROBID) 100 MG capsule Take 1 capsule (100 mg total)  by mouth 2 (two) times daily. 10 capsule 0   nitroGLYCERIN (NITROSTAT) 0.4 MG SL tablet Place 0.4 mg under the tongue every 5 (five) minutes as needed for chest pain.      pantoprazole (PROTONIX) 40 MG tablet Take by mouth.     PARoxetine (PAXIL) 40 MG tablet Take 40 mg by mouth at bedtime.     phenazopyridine (PYRIDIUM) 200 MG tablet Take 1 tablet (200 mg total) by mouth 3 (three) times daily. 6 tablet 0   Polyvinyl Alcohol-Povidone PF 1.4-0.6 % SOLN Place 1-2 drops into both eyes 3 (three) times daily as needed (for dry eyes.).     promethazine-dextromethorphan (PROMETHAZINE-DM) 6.25-15 MG/5ML syrup Take 5 mLs by mouth 4 (four) times daily as needed. 118 mL 0   trolamine salicylate (ASPERCREME) 10 % cream Apply 1 application topically 4 (four) times daily as needed for muscle pain.     UNABLE TO FIND CBD Oil     vitamin B-12 (CYANOCOBALAMIN) 1000 MCG tablet Take 1,000 mcg by mouth at bedtime.     Vitamin D, Ergocalciferol, (DRISDOL) 1.25 MG (50000 UT) CAPS capsule Take by mouth.     metoprolol succinate (TOPROL-XL) 25 MG 24 hr tablet Take 12.5 mg by mouth 2 (two) times daily.      No current facility-administered medications for this visit.    OBJECTIVE: Vitals:   09/04/21 1400  BP: 128/74  Pulse: 81  Resp: 20  Temp: 98.7 F (37.1 C)  SpO2: 100%     Body mass index is 40.26 kg/m.    ECOG FS:0 - Asymptomatic  Physical Exam Constitutional:      Appearance: She is obese.  Cardiovascular:     Rate and Rhythm: Normal rate and regular rhythm.  Pulmonary:     Effort: Pulmonary effort is normal.     Breath sounds: Normal breath sounds.  Lymphadenopathy:     Upper Body:     Right upper body: Axillary adenopathy (tenderness on exam with fullness) present.  Neurological:     Mental Status: She is alert.     LAB RESULTS:  Lab Results  Component Value Date   NA 138 10/11/2020   K 3.8 10/11/2020   CL 101 10/11/2020     CO2 29 10/11/2020   GLUCOSE 118 (H) 10/11/2020   BUN 17  10/11/2020   CREATININE 0.64 10/11/2020   CALCIUM 9.1 10/11/2020   PROT 8.1 08/11/2011   ALBUMIN 3.8 08/11/2011   AST 24 08/11/2011   ALT 32 08/11/2011   ALKPHOS 72 08/11/2011   BILITOT 0.6 08/11/2011   GFRNONAA >60 10/11/2020   GFRAA >60 01/20/2017    Lab Results  Component Value Date   WBC 8.1 10/11/2020   NEUTROABS 4.8 10/11/2020   HGB 13.8 10/11/2020   HCT 40.4 10/11/2020   MCV 96.7 10/11/2020   PLT 289 10/11/2020     STUDIES: No results found.   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  Oncotype DX score 0.  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  Received adjuvant XRT and did not require chemotherapy.  Initially started on letrozole which was discontinued secondary to hot flashes.  Switched to anastrozole which she tolerates better.  Will complete 5 years of treatment in April 2024.  Most recent mammogram is from 02/25/2020 which was BI-RADS Category 2 benign.  Apparently missed mammogram in January due to COVID 19.  We will get this rescheduled.  Osteopenia- Bone density from Jul 19, 2020 reported T score of -1.3 which is worse from initial baseline in May 2019.  She was started on vitamin D2 50,000 units weekly.  Needs repeat DEXA scan in the next 1 to 2 weeks as well.  Hot flashes- Overall have improved on anastrozole although did have 1 day of severe hot flashes when she accidentally took 2.  Anxiety- Unclear etiology.  Newest medication is vitamin D2 50,000 units weekly.  Discussed side effects with patient which can cause anxiety and mood changes.  Can switch to daily D2 plus calcium if agreeable.  Patient would like to try.  Recommend 1250 to 2500 mg D2 and 600 1200 mg calcium based on toleration.  We will also get bone density per  above.  Disposition- Mammogram and bone density scan in 1 to 2 weeks. We will call with results. RTC in 6 months for follow-up with Dr. Grayland Ormond.  I spent 25 minutes dedicated to the care of this patient (face-to-face and non-face-to-face) on the date of the encounter to include what is described in the assessment and plan.  Patient expressed understanding and was in agreement with this plan. She also understands that She can call clinic at any time with any questions, concerns, or complaints.    Cancer Staging  Primary cancer of upper outer quadrant of right female breast [facility]) Staging form: Breast, AJCC 8th Edition - Clinical stage from 03/14/2017: Stage IA (cT1b, cN0, cM0, G2, ER+, PR+, HER2-) - Signed by Lloyd Huger, MD on 03/15/2017 Histologic grading system: 3 grade system Laterality: Right   Jacquelin Hawking, NP   09/04/2021 2:05 PM

## 2021-09-18 ENCOUNTER — Other Ambulatory Visit: Payer: Medicare Other

## 2021-09-29 ENCOUNTER — Other Ambulatory Visit: Payer: Self-pay | Admitting: Family Medicine

## 2021-09-29 ENCOUNTER — Other Ambulatory Visit: Payer: Self-pay | Admitting: Oncology

## 2021-09-29 ENCOUNTER — Ambulatory Visit
Admission: RE | Admit: 2021-09-29 | Discharge: 2021-09-29 | Disposition: A | Discharge status: Home / Self Care | Payer: Medicare Other | Source: Ambulatory Visit | Attending: Oncology | Admitting: Oncology

## 2021-09-29 DIAGNOSIS — C50411 Malignant neoplasm of upper-outer quadrant of right female breast: Secondary | ICD-10-CM

## 2021-09-29 DIAGNOSIS — M5416 Radiculopathy, lumbar region: Secondary | ICD-10-CM

## 2021-09-29 DIAGNOSIS — M79621 Pain in right upper arm: Secondary | ICD-10-CM | POA: Insufficient documentation

## 2021-10-16 ENCOUNTER — Other Ambulatory Visit: Payer: Self-pay | Admitting: Oncology

## 2021-10-17 ENCOUNTER — Other Ambulatory Visit: Payer: Self-pay | Admitting: Cardiovascular Disease

## 2021-10-20 ENCOUNTER — Ambulatory Visit
Admission: EM | Admit: 2021-10-20 | Discharge: 2021-10-20 | Disposition: A | Discharge status: Home / Self Care | Payer: Medicare Other

## 2021-10-20 ENCOUNTER — Ambulatory Visit
Admission: RE | Admit: 2021-10-20 | Discharge: 2021-10-20 | Disposition: A | Discharge status: Home / Self Care | Payer: Medicare Other | Source: Ambulatory Visit | Attending: Oncology | Admitting: Oncology

## 2021-10-20 DIAGNOSIS — H53143 Visual discomfort, bilateral: Secondary | ICD-10-CM | POA: Diagnosis not present

## 2021-10-20 DIAGNOSIS — Z77098 Contact with and (suspected) exposure to other hazardous, chiefly nonmedicinal, chemicals: Secondary | ICD-10-CM | POA: Diagnosis not present

## 2021-10-20 DIAGNOSIS — M8589 Other specified disorders of bone density and structure, multiple sites: Secondary | ICD-10-CM | POA: Diagnosis not present

## 2021-10-20 NOTE — ED Provider Notes (Signed)
[facility]    CSN: [id number] Arrival date & time: 10/20/21  1433      History   Chief Complaint Chief Complaint  Patient presents with   Allergic Reaction    HPI Linda Yang is a 81 y.o. female.   HPI  81 year old female here for evaluation of eye complaints.  Patient reports that 3 hours ago she was weeding and came in contact with a small plant.  She thinks she had sap on her fingers and then touched her eyes because she soon developed burning in her eyes, light sensitivity, tearing, and nasal congestion.  She then went home and instilled some lubricating eyedrops and some artificial tears and took 2 singular tablets but did not do any irrigation of her eyes.  She is continuing to have symptoms at present.  Past Medical History:  Diagnosis Date   Allergies    Anginal pain ([place])    occas. took ntg last week   Anxiety    Arthritis    Asthma    Breast cancer ([place]) 02/2017   right breast   Complication of anesthesia    Coronary artery disease    Dyspnea    doe   Family history of breast cancer    Family history of kidney cancer    Family history of leukemia    Family history of lymphoma    Fatty liver    GERD (gastroesophageal reflux disease)    Glaucoma    Headache    High cholesterol    Hypertension    Macular degeneration    Myocardial infarction ([place])    5/18   Personal history of radiation therapy    PONV (postoperative nausea and vomiting)    Sleep apnea    no CPAP    Patient Active Problem List   Diagnosis Date Noted   Genetic testing 12/31/2018   Family history of breast cancer    Family history of lymphoma    Family history of leukemia    Family history of kidney cancer    Mild persistent asthma without complication 49/44/9675   B12 deficiency 02/10/2018   Mixed hyperlipidemia 02/10/2018   Non morbid obesity due to excess calories 02/10/2018   Primary cancer of upper outer quadrant of right female breast ([place]) 03/15/2017    History of right breast cancer 03/14/2017   Coronary artery disease involving native coronary artery of native heart 07/26/2016   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 07/19/2016   Vitamin D deficiency 03/22/2016   Osteopenia of multiple sites 02/17/2016   Anxiety state 01/19/2015   DDD (degenerative disc disease), lumbar 08/10/2013   Lumbar radiculitis 08/10/2013   De Quervain's tenosynovitis, left 08/21/2012   Low back pain radiating to right leg 07/24/2012   CVA 07/08/2009   GOUT, UNSPECIFIED 07/06/2009   Essential hypertension 07/06/2009   PALPITATIONS 07/06/2009    Past Surgical History:  Procedure Laterality Date   ABDOMINAL HYSTERECTOMY     ACHILLES TENDON SURGERY     BREAST BIOPSY Right 03/05/2017   Affirm Bx- invasive mammary   BREAST LUMPECTOMY Right 03/25/2017   invasive mammary, clear margins, LN negative   BUNIONECTOMY Bilateral    CATARACT EXTRACTION W/PHACO Left 01/29/2017   Procedure: CATARACT EXTRACTION PHACO AND INTRAOCULAR LENS PLACEMENT ([place]);  Surgeon: Birder Robson, MD;  Location: [facility];  Service: Ophthalmology;  Laterality: Left;  [place] 00:49.9 AP% 20.3 CDE 10.11 Fluid Pack lot # [id number]   CATARACT EXTRACTION W/PHACO Right 08/30/2020  Procedure: CATARACT EXTRACTION PHACO AND INTRAOCULAR LENS PLACEMENT ([facility]) RIGHT 11.15 01:09.0;  Surgeon: Birder Robson, MD;  Location: [place];  Service: Ophthalmology;  Laterality: Right;  [place]   COLONOSCOPY WITH PROPOFOL N/A 08/08/2021   Procedure: COLONOSCOPY WITH PROPOFOL;  Surgeon: Lesly Rubenstein, MD;  Location: [facility];  Service: Endoscopy;  Laterality: N/A;   CORONARY ANGIOPLASTY     STENT 5/18   CORONARY STENT INTERVENTION N/A 07/18/2016   Procedure: Coronary Stent Intervention;  Surgeon: Yolonda Kida, MD;  Location: [facility];  Service: Cardiovascular;  Laterality: N/A;   CTR     ESOPHAGOGASTRODUODENOSCOPY (EGD) WITH PROPOFOL N/A 08/08/2021    Procedure: ESOPHAGOGASTRODUODENOSCOPY (EGD) WITH PROPOFOL;  Surgeon: Lesly Rubenstein, MD;  Location: [facility];  Service: Endoscopy;  Laterality: N/A;   INTRAVASCULAR PRESSURE WIRE/FFR STUDY N/A 07/18/2016   Procedure: Intravascular Pressure Wire/FFR Study;  Surgeon: Yolonda Kida, MD;  Location: [facility];  Service: Cardiovascular;  Laterality: N/A;   LEFT HEART CATH AND CORONARY ANGIOGRAPHY N/A 07/18/2016   Procedure: Left Heart Cath and Coronary Angiography;  Surgeon: Yolonda Kida, MD;  Location: [place] [facility];  Service: Cardiovascular;  Laterality: N/A;   MASTECTOMY Right    partial   PARTIAL MASTECTOMY WITH NEEDLE LOCALIZATION Right 03/25/2017   Procedure: PARTIAL MASTECTOMY WITH NEEDLE LOCALIZATION;  Surgeon: Herbert Pun, MD;  Location: [facility];  Service: General;  Laterality: Right;   SENTINEL NODE BIOPSY Right 03/25/2017   Procedure: SENTINEL NODE BIOPSY;  Surgeon: Herbert Pun, MD;  Location: [facility];  Service: General;  Laterality: Right;   TRIGGER FINGER RELEASE Bilateral     OB History   No obstetric history on file.      Home Medications    Prior to Admission medications   Medication Sig Start Date End Date Taking? Authorizing Provider  albuterol (PROVENTIL) (2.5 MG/3ML) 0.083% nebulizer solution Inhale into the lungs. 04/07/21 04/07/22  [provider]  anastrozole (ARIMIDEX) 1 MG tablet Take 1 tablet (1 mg total) by mouth daily. 10/16/21   Lloyd Huger, MD  aspirin EC 81 MG tablet Take 81 mg by mouth daily.     [provider]  benzonatate (TESSALON) 100 MG capsule Take 2 capsules (200 mg total) by mouth every 8 (eight) hours. 05/26/21   Margarette Canada, NP  budesonide (PULMICORT) 0.25 MG/2ML nebulizer solution Take 0.25 mg by nebulization 2 (two) times daily as needed.    [provider]  Calcium Carbonate-Simethicone 750-80 MG CHEW Chew 1 tablet by mouth daily as needed (heartburn).     [provider]  Calcium Carbonate-Vitamin D 600-400 MG-UNIT tablet Take 2 tablets by mouth daily.    [provider]  Camphor-Menthol-Capsicum 80-24-16 MG [place] 1-3 patches onto the skin daily as needed (pain).     [provider]  cholecalciferol (VITAMIN D) 400 units TABS tablet Take 400 Units by mouth at bedtime.    [provider]  docusate sodium (COLACE) 100 MG capsule Take 100 mg by mouth daily as needed (for constipation).    [provider]  ezetimibe (ZETIA) 10 MG tablet Take 10 mg by mouth daily.    [provider]  ipratropium (ATROVENT) 0.06 % nasal spray Place 2 sprays into both nostrils 4 (four) times daily. 05/26/21   Margarette Canada, NP  METAMUCIL FIBER PO Take by mouth as needed.    [provider]  metoprolol succinate (TOPROL-XL) 25 MG 24 hr tablet Take 12.5  mg by mouth 2 (two) times daily.  08/02/16 09/04/21  [provider]  Multiple Vitamins-Minerals (ICAPS AREDS 2 PO) Take by mouth 2 (two) times daily.    [provider]  nitrofurantoin, macrocrystal-monohydrate, (MACROBID) 100 MG capsule Take 1 capsule (100 mg total) by mouth 2 (two) times daily. 08/05/21   Margarette Canada, NP  nitroGLYCERIN (NITROSTAT) 0.4 MG SL tablet Place 0.4 mg under the tongue every 5 (five) minutes as needed for chest pain.  07/06/16   [provider]  pantoprazole (PROTONIX) 40 MG tablet Take by mouth. 05/12/21 05/12/22  [provider]  PARoxetine (PAXIL) 40 MG tablet Take 40 mg by mouth at bedtime.    [provider]  phenazopyridine (PYRIDIUM) 200 MG tablet Take 1 tablet (200 mg total) by mouth 3 (three) times daily. 08/05/21   Margarette Canada, NP  Polyvinyl Alcohol-Povidone PF 1.4-0.6 % SOLN Place 1-2 drops into both eyes 3 (three) times daily as needed (for dry eyes.).    [provider]  promethazine-dextromethorphan (PROMETHAZINE-DM) 6.25-15 MG/5ML syrup Take 5 mLs by mouth 4 (four) times  daily as needed. 05/26/21   Margarette Canada, NP  REPATHA SURECLICK 993 MG/ML SOAJ Inject 140 mg into the skin every 14 (fourteen) days. 10/17/21   Minna Merritts, MD  trolamine salicylate (ASPERCREME) 10 % cream Apply 1 application topically 4 (four) times daily as needed for muscle pain.    [provider]  UNABLE TO FIND CBD Oil    [provider]  vitamin B-12 (CYANOCOBALAMIN) 1000 MCG tablet Take 1,000 mcg by mouth at bedtime.    [provider]  Vitamin D, Ergocalciferol, (DRISDOL) 1.25 MG (50000 UT) CAPS capsule Take by mouth. 12/29/18   [provider]    Family History Family History  Problem Relation Age of Onset   Breast cancer Paternal Aunt        post men.    Breast cancer Cousin    Lymphoma Father    Leukemia Maternal Aunt    Cervical cancer Paternal Grandmother        d. 44s   Breast cancer Cousin        dx 34   Cervical cancer Cousin    Kidney cancer Cousin     Social History Social History   Tobacco Use   Smoking status: Never   Smokeless tobacco: Never  Vaping Use   Vaping Use: Never used  Substance Use Topics   Alcohol use: No   Drug use: No     Allergies   Atorvastatin, Brilinta [ticagrelor], Erythromycin, Metoprolol, Other, Red yeast rice [cholestin], Statins, and Sulfamethoxazole-trimethoprim   Review of Systems Review of Systems  Eyes:  Positive for photophobia, pain, discharge and redness. Negative for visual disturbance.     Physical Exam Triage Vital Signs ED Triage Vitals  Enc Vitals Group     BP 10/20/21 1450 (!) 144/72     Pulse Rate 10/20/21 1450 77     Resp 10/20/21 1450 20     Temp 10/20/21 1450 98.6 F (37 C)     Temp Source 10/20/21 1450 Oral     SpO2 10/20/21 1450 98 %     Weight --      Height --      Head Circumference --      Peak Flow --      Pain Score 10/20/21 1458 9     Pain Loc --      Pain Edu? --  Excl. in GC? --    No data found.  Updated Vital Signs BP (!) 144/72  (BP Location: Left Arm)   Pulse 77   Temp 98.6 F (37 C) (Oral)   Resp 20   SpO2 98%   Visual Acuity Right Eye Distance: 20/200 Left Eye Distance: 20/25 Bilateral Distance: 20/25  Right Eye Near:   Left Eye Near:    Bilateral Near:     Physical Exam Vitals and nursing note reviewed.  Constitutional:      Appearance: Normal appearance. She is not ill-appearing.  HENT:     Head: Normocephalic and atraumatic.  Eyes:     General: No scleral icterus.       Right eye: Discharge present.        Left eye: Discharge present.    Extraocular Movements: Extraocular movements intact.     Pupils: Pupils are equal, round, and reactive to light.  Skin:    General: Skin is warm and dry.     Capillary Refill: Capillary refill takes less than 2 seconds.     Findings: No erythema.  Neurological:     General: No focal deficit present.     Mental Status: She is alert and oriented to person, place, and time.  Psychiatric:        Mood and Affect: Mood normal.        Behavior: Behavior normal.        Thought Content: Thought content normal.        Judgment: Judgment normal.      UC Treatments / Results  Labs (all labs ordered are listed, but only abnormal results are displayed) Labs Reviewed - No data to display  EKG   Radiology   Procedures Procedures (including critical care time)  Medications Ordered in UC Medications - No data to display  Initial Impression / Assessment and Plan / UC Course  I have reviewed the triage vital signs and the nursing notes.  Pertinent labs & imaging results that were available during my care of the patient were reviewed by me and considered in my medical decision making (see chart for details).   Patient is a pleasant 81 year old female here for burning and tearing in both eyes that being on for the past 3 hours since she came in contact with the sap from a mole plant also known as Euphorbia lathyris.  She did not irrigate her eyes but she did  not still some lubricating eyedrops and artificial tears.  She also took 2 singular tablets.  On exam patient is unable to open her eyes secondary to the pain and light sensitivity.  I anesthetized both eyes with 2 drops of tetracaine and she was more tolerant of the light and able to open her eyes for visual inspection.  The bulbar conjunctiva of the right eye is mildly erythematous.  The left eye is unremarkable.  Both pupils are equal round and reactive.  They will conjunctiva do not appear injected either.  I checked the patient's eye pH using pH paper and her pH is 6 in both eyes.  Visual acuity is OU 20/25, OD 20/200, OS 20/25.  I stained both eyes using fluorescein dye and examined them under [place] lamp.  This inspection did not reveal the presence of any corneal abrasion.  Patient is typically seen at [facility] by Dr. Richmond Campbell but I am unable to reach [facility].  She is also seen Dr. George Ina at [facility].  I will call them to see if they can evaluate the patient.  I have spoken with Loree Fee, one of the technicians at [facility] in [place], and she has asked me to have the patient go to the [place] office and she will get her in there to be evaluated by ophthalmology.   Final Clinical Impressions(s) / UC Diagnoses   Final diagnoses:  Photophobia of both eyes  Chemical exposure of eye     Discharge Instructions      As we discussed, I am concerned that you may have sustained a chemical burn to your corneas from the plant sap.  Please go to the [place] office of [facility] to be evaluated by one of the ophthalmologist to determine the extent of injury.  Please go now.     ED Prescriptions   None    PDMP not reviewed this encounter.   Margarette Canada, NP 10/20/21 [phone]

## 2021-10-20 NOTE — ED Triage Notes (Signed)
Pt states that she had been weeding a Euphorbia lathyris  Common Name(s): Caper SprugeGopher SpurgeMole PlantPaper SpurgeSassy Barnabas Lister and feels like she had it on her hand and touched her face. Pt c/o pain/ burning and tearing in both eyes, light sensitive and nasal congestion that started at noon. Pt took 2 Montelukast and complete dry eye relief. Pt states she cannot open her eyes due to the pain.

## 2021-10-20 NOTE — Discharge Instructions (Addendum)
As we discussed, I am concerned that you may have sustained a chemical burn to your corneas from the plant sap.  Please go to the [place] office of [facility] to be evaluated by one of the ophthalmologist to determine the extent of injury.  Please go now.

## 2021-11-09 ENCOUNTER — Ambulatory Visit: Payer: Self-pay | Admitting: Urology

## 2021-11-16 ENCOUNTER — Ambulatory Visit: Payer: Medicare Other | Admitting: Urology

## 2022-01-12 ENCOUNTER — Other Ambulatory Visit: Payer: Self-pay | Admitting: Oncology

## 2022-01-23 ENCOUNTER — Telehealth: Payer: Self-pay

## 2022-01-23 NOTE — Telephone Encounter (Signed)
PA started through [place] (Key: [id number])  Your information has been sent to [facility].

## 2022-01-23 NOTE — Telephone Encounter (Signed)
Request Reference Number: [id number]. REPATHA SURE INJ '140MG'$ /ML is approved through 02/19/2023. Your patient may now fill this prescription and it will be covered.

## 2022-02-01 ENCOUNTER — Other Ambulatory Visit: Payer: Self-pay | Admitting: Cardiovascular Disease

## 2022-02-01 NOTE — Telephone Encounter (Signed)
Please advise if OK to refill. It is listed on the last AVS however it is also listed under allergies as causing hypotension. Dr. Rockey Situ does not mention it in his actual note. Added under historical provider on med list. Thank you!

## 2022-02-20 ENCOUNTER — Other Ambulatory Visit: Payer: Self-pay

## 2022-02-20 ENCOUNTER — Emergency Department: Payer: Medicare Other

## 2022-02-20 ENCOUNTER — Encounter: Payer: Self-pay | Admitting: Emergency Medicine

## 2022-02-20 DIAGNOSIS — I2510 Atherosclerotic heart disease of native coronary artery without angina pectoris: Secondary | ICD-10-CM | POA: Diagnosis not present

## 2022-02-20 DIAGNOSIS — J45909 Unspecified asthma, uncomplicated: Secondary | ICD-10-CM | POA: Insufficient documentation

## 2022-02-20 DIAGNOSIS — R519 Headache, unspecified: Secondary | ICD-10-CM | POA: Insufficient documentation

## 2022-02-20 DIAGNOSIS — I10 Essential (primary) hypertension: Secondary | ICD-10-CM | POA: Insufficient documentation

## 2022-02-20 DIAGNOSIS — Z853 Personal history of malignant neoplasm of breast: Secondary | ICD-10-CM | POA: Diagnosis not present

## 2022-02-20 DIAGNOSIS — G459 Transient cerebral ischemic attack, unspecified: Secondary | ICD-10-CM | POA: Diagnosis not present

## 2022-02-20 DIAGNOSIS — Z1152 Encounter for screening for COVID-19: Secondary | ICD-10-CM | POA: Diagnosis not present

## 2022-02-20 LAB — RESP PANEL BY RT-PCR (RSV, FLU A&B, COVID)  RVPGX2
Influenza A by PCR: NEGATIVE
Influenza B by PCR: NEGATIVE
Resp Syncytial Virus by PCR: NEGATIVE
SARS Coronavirus 2 by RT PCR: NEGATIVE

## 2022-02-20 LAB — CBC
HCT: 45.1 % (ref 36.0–46.0)
Hemoglobin: 14.8 g/dL (ref 12.0–15.0)
MCH: 31.4 pg (ref 26.0–34.0)
MCHC: 32.8 g/dL (ref 30.0–36.0)
MCV: 95.8 fL (ref 80.0–100.0)
Platelets: 313 10*3/uL (ref 150–400)
RBC: 4.71 MIL/uL (ref 3.87–5.11)
RDW: 12.8 % (ref 11.5–15.5)
WBC: 9.9 10*3/uL (ref 4.0–10.5)
nRBC: 0 % (ref 0.0–0.2)

## 2022-02-20 LAB — CBC WITH DIFFERENTIAL/PLATELET
Abs Immature Granulocytes: 0.02 10*3/uL (ref 0.00–0.07)
Basophils Absolute: 0.1 10*3/uL (ref 0.0–0.1)
Basophils Relative: 1 %
Eosinophils Absolute: 0.1 10*3/uL (ref 0.0–0.5)
Eosinophils Relative: 1 %
HCT: 43.8 % (ref 36.0–46.0)
Hemoglobin: 14.7 g/dL (ref 12.0–15.0)
Immature Granulocytes: 0 %
Lymphocytes Relative: 25 %
Lymphs Abs: 2.4 10*3/uL (ref 0.7–4.0)
MCH: 31.6 pg (ref 26.0–34.0)
MCHC: 33.6 g/dL (ref 30.0–36.0)
MCV: 94.2 fL (ref 80.0–100.0)
Monocytes Absolute: 0.9 10*3/uL (ref 0.1–1.0)
Monocytes Relative: 10 %
Neutro Abs: 6 10*3/uL (ref 1.7–7.7)
Neutrophils Relative %: 63 %
Platelets: 346 10*3/uL (ref 150–400)
RBC: 4.65 MIL/uL (ref 3.87–5.11)
RDW: 13 % (ref 11.5–15.5)
WBC: 9.4 10*3/uL (ref 4.0–10.5)
nRBC: 0 % (ref 0.0–0.2)

## 2022-02-20 LAB — BASIC METABOLIC PANEL
Anion gap: 13 (ref 5–15)
BUN: 25 mg/dL — ABNORMAL HIGH (ref 8–23)
CO2: 25 mmol/L (ref 22–32)
Calcium: 9.5 mg/dL (ref 8.9–10.3)
Chloride: 100 mmol/L (ref 98–111)
Creatinine, Ser: 0.65 mg/dL (ref 0.44–1.00)
GFR, Estimated: >60 mL/min (ref >60)
Glucose, Bld: 117 mg/dL — ABNORMAL HIGH (ref 70–99)
Potassium: 3.5 mmol/L (ref 3.5–5.1)
Sodium: 138 mmol/L (ref 135–145)

## 2022-02-20 LAB — HEPATIC FUNCTION PANEL
ALT: 21 U/L (ref 0–44)
AST: 23 U/L (ref 15–41)
Albumin: 4.1 g/dL (ref 3.5–5.0)
Alkaline Phosphatase: 41 U/L (ref 38–126)
Bilirubin, Direct: <0.1 mg/dL (ref 0.0–0.2)
Total Bilirubin: 0.6 mg/dL (ref 0.3–1.2)
Total Protein: 7.1 g/dL (ref 6.5–8.1)

## 2022-02-20 LAB — ETHANOL: Alcohol, Ethyl (B): <10 mg/dL (ref <10)

## 2022-02-20 LAB — PROTIME-INR
INR: 1 (ref 0.8–1.2)
Prothrombin Time: 12.9 seconds (ref 11.4–15.2)

## 2022-02-20 LAB — TROPONIN I (HIGH SENSITIVITY)
Troponin I (High Sensitivity): 4 ng/L (ref <18)
Troponin I (High Sensitivity): 4 ng/L (ref <18)

## 2022-02-20 LAB — CBG MONITORING, ED: Glucose-Capillary: 137 mg/dL — ABNORMAL HIGH (ref 70–99)

## 2022-02-20 LAB — APTT: aPTT: 29 seconds (ref 24–36)

## 2022-02-20 NOTE — ED Provider Triage Note (Signed)
Emergency Medicine Provider Triage Evaluation Note  Linda Yang, a 82 y.o. female  was evaluated in triage.  Pt complains of left-sided parietal headache with some referral down the left side of the neck and shoulder.  She reports onset of symptoms today at approximately 3:30 PM.  She orts being of a normal level of health and wellbeing prior to that time.  She reports some sensation of numbness and tingling to the left side of the face primarily at the lips as well as about 2 weeks of some visual deficits being followed by her ophthalmologist.  He denies any slurred speech, weakness, paralysis, or syncope.  Review of Systems  Positive: Facial paresthesias, parietal headache Negative: FCS  Physical Exam  BP 128/85 (BP Location: Left Arm)   Pulse 96   Temp 98.2 F (36.8 C) (Oral)   Resp 16   Ht '5\' 2"'$  (1.575 m)   Wt 97.5 kg   SpO2 96%   BMI 39.32 kg/m  Gen:   Awake, no distress NAD Resp:  Normal effort CTA MSK:   Moves extremities without difficulty  Other:    Medical Decision Making  Medically screening exam initiated at 7:49 PM.  Appropriate orders placed.  Linda Yang was informed that the remainder of the evaluation will be completed by another provider, this initial triage assessment does not replace that evaluation, and the importance of remaining in the ED until their evaluation is complete.  Geriatric patient to the ED for evaluation of a left-sided parietal headache with referral down the neck and shoulder.  She also reports some intermittent left-sided facial paresthesias.  She presents to the ED for evaluation of symptoms that began at approximately 3:30 this afternoon.   Melvenia Needles, PA-C 02/20/22 1951

## 2022-02-20 NOTE — ED Triage Notes (Signed)
Pt to ED from home c/o left sided headache radiating down left neck, shoulder and back.  States pain is intermittent since 1530 today.  Denies having pain like this before.  Denies injuries.  Takes '81mg'$  ASA daily.  States has had a strong dry cough since dx with COVID 1 year ago, pain worse with coughing.  When called for triage pt states she became dizzy and pain came back when standing.  Pt is A&Ox4, denies weakness, face symmetrical, states numbness to right face arm and leg which she hasn't noticed throughout the day.  States right eye visual changes that she's being followed by PCP for x2 weeks, supposed to have MRI.  Cannon Kettle, PA in triage for MSE.

## 2022-02-21 ENCOUNTER — Emergency Department
Admission: EM | Admit: 2022-02-21 | Discharge: 2022-02-21 | Disposition: A | Discharge status: Home / Self Care | Payer: Medicare Other | Attending: Emergency Medicine | Admitting: Emergency Medicine

## 2022-02-21 ENCOUNTER — Emergency Department: Payer: Medicare Other

## 2022-02-21 DIAGNOSIS — R519 Headache, unspecified: Secondary | ICD-10-CM

## 2022-02-21 MED ORDER — KETOROLAC TROMETHAMINE 15 MG/ML IJ SOLN
15.0000 mg | Freq: Once | INTRAMUSCULAR | Status: AC
  Administered 2022-02-21: 15 mg via INTRAVENOUS
  Filled 2022-02-21: qty 1

## 2022-02-21 MED ORDER — IOHEXOL 350 MG/ML SOLN
75.0000 mL | Freq: Once | INTRAVENOUS | Status: AC | PRN
  Administered 2022-02-21: 75 mL via INTRAVENOUS

## 2022-02-21 MED ORDER — ACETAMINOPHEN 500 MG PO TABS
1000.0000 mg | ORAL_TABLET | Freq: Once | ORAL | Status: AC
  Administered 2022-02-21: 1000 mg via ORAL
  Filled 2022-02-21: qty 2

## 2022-02-21 NOTE — ED Notes (Signed)
Pt Dc to home. Dc isntructins reviewed. IV removed, cath intact, pressure dressing applied. No bleeding noted at site. Pt assisted out of dept with wheelchair with family member.

## 2022-02-21 NOTE — Discharge Instructions (Signed)
Take acetaminophen 650 mg and ibuprofen 400 mg every 6 hours for pain.  Take with food.  Thank you for choosing us for your health care today!  Please see your primary doctor this week for a follow up appointment.   Sometimes, in the early stages of certain disease courses it is difficult to detect in the emergency department evaluation -- so, it is important that you continue to monitor your symptoms and call your doctor right away or return to the emergency department if you develop any new or worsening symptoms.  Please go to the following website to schedule new (and existing) patient appointments:   [email or web address]  If you do not have a primary doctor try calling the following clinics to establish care:  If you have insurance:  [facility] [phone] [address] [place] [address]   [facility]  [phone] [address] [place] [address] [place] [address]   If you do not have insurance:  [facility]  [phone] [address] [place] [address]   The following is another list of primary care offices in the area who are accepting new patients at this time.  Please reach out to one of them directly and let them know you would like to schedule an appointment to follow up on an Emergency Department visit, and/or to establish a new primary care provider (PCP).  There are likely other primary care clinics in the are who are accepting new patients, but this is an excellent place to start:  [facility] Lead physician: Dr Angela Bacigalupo [address] [place], [place] [address] [phone]  [facility] Lead Physician: Dr Krichna Sowles [address], [place], [place] [address] [phone]  [facility]  Lead Physician: Dr Megan Johnson [address], [place], [place] [address] [phone]  [facility] [place] [facility] Lead Physician: Dr Alex  Karamalegos [address], [place], [place] [address] [phone]  [facility] [place] Lead Physician: Dr Laura Berglund [address], [place], [place] [address] [phone]   It was my pleasure to care for you today.   Cayla Wiegand S. Jessie Schrieber, MD  

## 2022-02-21 NOTE — ED Provider Notes (Signed)
[facility] Provider Note    Event Date/Time   First MD Initiated Contact with Patient 02/21/22 [phone]     (approximate)   History   Headache   HPI  Linda Yang is a 82 y.o. female   Past medical history of breast cancer, asthma, CAD, high cholesterol, hypertension, who presents to the emergency department with left-sided headache starting yesterday while at rest in the morning radiates to the neck and upper back.  No trauma or inciting event.  No history of frequent headache.  No other acute medical complaints.    She has multiple chronic issues that are being worked up as an outpatient including visual changes seeing a neuro ophthalmologist, no changes in the symptoms.  Reports no other neurologic symptoms.    He is obtained by the patient and her spouse who is at bedside.       Physical Exam   Triage Vital Signs: ED Triage Vitals  Enc Vitals Group     BP 02/20/22 1930 128/85     Pulse Rate 02/20/22 1930 96     Resp 02/20/22 1930 16     Temp 02/20/22 1930 98.2 F (36.8 C)     Temp Source 02/20/22 1930 Oral     SpO2 02/20/22 1930 96 %     Weight 02/20/22 1931 215 lb (97.5 kg)     Height 02/20/22 1931 '5\' 2"'$  (1.575 m)     Head Circumference --      Peak Flow --      Pain Score 02/20/22 1931 0     Pain Loc --      Pain Edu? --      Excl. in [place]? --     Most recent vital signs: Vitals:   02/21/22 0339 02/21/22 0537  BP: (!) 137/55 130/63  Pulse: 85 77  Resp: 16 20  Temp: (!) 97.5 F (36.4 C)   SpO2: 97% 98%    General: Awake, no distress.  CV:  Good peripheral perfusion.  Resp:  Normal effort.  Abd:  No distention.  Other:  She appears awake alert oriented pleasant comfortable.  Hemodynamics appropriate and reassuring.  No fever.  No tenderness to palpation of the head or temporal area, no tenderness to palpation of the C-spine.  Neck is supple with full range of motion.  She has no focal neurologic deficits including visual deficit  deficits, visual field cuts, extraocular movements are intact, no motor or sensory changes   ED Results / Procedures / Treatments   Labs (all labs ordered are listed, but only abnormal results are displayed) Labs Reviewed  BASIC METABOLIC PANEL - Abnormal; Notable for the following components:      Result Value   Glucose, Bld 117 (*)    BUN 25 (*)    All other components within normal limits  CBG MONITORING, ED - Abnormal; Notable for the following components:   Glucose-Capillary 137 (*)    All other components within normal limits  RESP PANEL BY RT-PCR (RSV, FLU A&B, COVID)  [id number]  CBC  PROTIME-INR  APTT  ETHANOL  HEPATIC FUNCTION PANEL  CBC WITH DIFFERENTIAL/PLATELET  TROPONIN I (HIGH SENSITIVITY)  TROPONIN I (HIGH SENSITIVITY)     I reviewed labs and they are notable for normal creatinine.  Initial troponin is 4.  EKG  ED ECG REPORT I, Lucillie Garfinkel, the attending physician, personally viewed and interpreted this ECG.   Date: 02/21/2022  EKG Time: 1935  Rate: 92  Rhythm: normal sinus rhythm  Axis: nl  Intervals:none  ST&T Change: No acute ischemic changes.  Single PVC    RADIOLOGY I independently reviewed and interpreted cT angiogram of the head and neck and see no obvious bleeding or midline shift   PROCEDURES:  Critical Care performed: No  Procedures   MEDICATIONS ORDERED IN ED: Medications  iohexol (OMNIPAQUE) 350 MG/ML injection 75 mL (75 mLs Intravenous Contrast Given 02/21/22 0446)  acetaminophen (TYLENOL) tablet 1,000 mg (1,000 mg Oral Given 02/21/22 0603)  ketorolac (TORADOL) 15 MG/ML injection 15 mg (15 mg Intravenous Given 02/21/22 0604)     IMPRESSION / MDM / ASSESSMENT AND PLAN / ED COURSE  I reviewed the triage vital signs and the nursing notes.                              Differential diagnosis includes, but is not limited to, migraine headache, tension type headache, cluster headache, intracranial bleeding, CVA, dissection, temporal  arteritis or other vascular inflammatory pathologies    MDM: Patient with headache and no red flag symptoms including neurologic deficits, temporal tenderness, new visual changes.  Given involvement of the neck, consider dissection, fortunately CT angiogram of the head and neck negative, medications given for headache and patient will follow-up with PMD.  Given evaluation and symptomatology and exam as above I doubt emergent pathologies like head bleed, stroke, dissection, vasculitis.  Was given strict return precautions for any worsening or new symptoms.   Patient's presentation is most consistent with acute presentation with potential threat to life or bodily function.       FINAL CLINICAL IMPRESSION(S) / ED DIAGNOSES   Final diagnoses:  Acute nonintractable headache, unspecified headache type     Rx / DC Orders   ED Discharge Orders     None        Note:  This document was prepared using Dragon voice recognition software and may include unintentional dictation errors.    Lucillie Garfinkel, MD 02/21/22 [phone]

## 2022-02-21 NOTE — ED Notes (Addendum)
Addendum- note entered In error.

## 2022-02-22 ENCOUNTER — Telehealth: Payer: Self-pay | Admitting: *Deleted

## 2022-02-22 NOTE — Telephone Encounter (Signed)
Patient called asking for  a return call to discuss coming off the Anastrozole now stating that it is making her "miserable". She is to complete her 5 years of therapy in April of this year and is asking if it would make that much of a difference to stop it now. Please advise.  PLAN:     1. 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  Because she had an Oncotype DX score of 0 and was low risk, she did not require adjuvant chemotherapy.  She has completed adjuvant XRT.  Letrozole was discontinued secondary to hot flashes.  Patient currently takes anastrozole.  Continue treatment for total of 5 years completing in April 2024.  Her most recent mammogram on February 25, 2020 was reported as BI-RADS 2.  Repeat in January 2023.  Return to clinic in 6 months for routine evaluation.

## 2022-02-22 NOTE — Telephone Encounter (Signed)
Call returned to patient and informed per Dr Grayland Ormond that she can discontinue her Anastrozole now. She was grateful to hear this and thanked me for letting her know

## 2022-03-08 ENCOUNTER — Encounter: Payer: Self-pay | Admitting: Oncology

## 2022-03-08 ENCOUNTER — Inpatient Hospital Stay: Payer: Medicare Other | Attending: Oncology | Admitting: Oncology

## 2022-03-08 VITALS — BP 113/71 | HR 98 | Temp 98.0°F | Resp 18 | Ht 62.0 in | Wt 215.0 lb

## 2022-03-08 DIAGNOSIS — Z79899 Other long term (current) drug therapy: Secondary | ICD-10-CM | POA: Diagnosis not present

## 2022-03-08 DIAGNOSIS — Z853 Personal history of malignant neoplasm of breast: Secondary | ICD-10-CM | POA: Diagnosis present

## 2022-03-08 DIAGNOSIS — M8580 Other specified disorders of bone density and structure, unspecified site: Secondary | ICD-10-CM | POA: Diagnosis not present

## 2022-03-08 DIAGNOSIS — C50411 Malignant neoplasm of upper-outer quadrant of right female breast: Secondary | ICD-10-CM

## 2022-03-08 NOTE — Progress Notes (Signed)
Wants to come off of the anastrozole early. Shooting pains in both breasts at times

## 2022-03-08 NOTE — Progress Notes (Signed)
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  INTERVAL HISTORY: Patient turns to clinic today for routine 30-monthevaluation.  She has discontinued anastrozole and states her mood has significantly improved.  She continues to have visual disturbances and dizziness, but no distinct etiology has been determined.  She otherwise feels well.  She denies any recent fevers or illnesses.  She has a good appetite and denies weight loss.  She denies any chest pain, shortness of breath, cough, or hemoptysis.  She denies any nausea, vomiting, constipation, or diarrhea.  She has no urinary complaints.  Patient offers no further specific complaints today.  REVIEW OF SYSTEMS:   Review of Systems  Constitutional: Negative.  Negative for fever, malaise/fatigue and weight loss.  Respiratory: Negative.  Negative for cough and shortness of breath.   Cardiovascular: Negative.  Negative for chest pain and leg swelling.  Gastrointestinal: Negative.  Negative for abdominal pain and constipation.  Genitourinary: Negative.  Negative for dysuria.  Musculoskeletal: Negative.  Negative for back pain.  Skin: Negative.  Negative for rash.  Neurological:  Positive for dizziness. Negative for sensory change, focal weakness, weakness and headaches.  Psychiatric/Behavioral: Negative.  The patient is not nervous/anxious.     As per HPI. Otherwise, a complete review of systems is negative.  PAST MEDICAL HISTORY: Past Medical History:  Diagnosis Date    Allergies    Anginal pain ([place]    occas. took ntg last week   Anxiety    Arthritis    Asthma    [place]   [place] breast   Complication of anesthesia    Coronary artery disease    Dyspnea    doe   Family history of breast cancer    Family history of kidney cancer    Family history of leukemia    Family history of lymphoma    Fatty liver    GERD (gastroesophageal reflux disease)    Glaucoma    Headache    High cholesterol    Hypertension    Macular degeneration    [place]   Personal history of radiation therapy    PONV (postoperative nausea and vomiting)    Sleep apnea    no CPAP    PAST SURGICAL HISTORY: Past Surgical History:  Procedure Laterality Date   ABDOMINAL HYSTERECTOMY     ACHILLES TENDON SURGERY     BREAST BIOPSY Right 03/05/2017   Affirm Bx- invasive mammary   BREAST LUMPECTOMY Right 03/25/2017   invasive mammary, clear margins, LN negative   BUNIONECTOMY Bilateral    CATARACT EXTRACTION W/PHACO Left 01/29/2017   Procedure: CATARACT EXTRACTION PHACO AND INTRAOCULAR LENS PLACEMENT ([place];  Surgeon: PBirder Robson MD;  Location: [facility] ORS;  Service: Ophthalmology;  Laterality: Left;  [place]00:49.9 AP% 20.3 CDE 10.11 Fluid Pack lot # [id number]  CATARACT EXTRACTION W/PHACO Right 08/30/2020   Procedure: CATARACT EXTRACTION PHACO AND INTRAOCULAR LENS PLACEMENT (I[facility]) RIGHT 11.15 01:09.0;  Surgeon: PBirder Robson MD;  [place]  Service: Ophthalmology;  Laterality: Right;  DEXTENZA   COLONOSCOPY WITH  PROPOFOL N/A 08/08/2021   Procedure: COLONOSCOPY WITH PROPOFOL;  Surgeon: Lesly Rubenstein, MD;  Location: [facility] ENDOSCOPY;  Service: Endoscopy;  Laterality: N/A;   CORONARY ANGIOPLASTY     STENT 5/18   CORONARY STENT INTERVENTION N/A 07/18/2016   Procedure: Coronary Stent Intervention;  Surgeon: Yolonda Kida, MD;  Location: [place] [facility] LAB;  Service: Cardiovascular;  Laterality: N/A;    CTR     ESOPHAGOGASTRODUODENOSCOPY (EGD) WITH PROPOFOL N/A 08/08/2021   Procedure: ESOPHAGOGASTRODUODENOSCOPY (EGD) WITH PROPOFOL;  Surgeon: Lesly Rubenstein, MD;  Location: [facility] ENDOSCOPY;  Service: Endoscopy;  Laterality: N/A;   INTRAVASCULAR PRESSURE WIRE/FFR STUDY N/A 07/18/2016   Procedure: Intravascular Pressure Wire/FFR Study;  Surgeon: Yolonda Kida, MD;  Location: [place] CV LAB;  Service: Cardiovascular;  Laterality: N/A;   LEFT HEART CATH AND CORONARY ANGIOGRAPHY N/A 07/18/2016   Procedure: Left Heart Cath and Coronary Angiography;  Surgeon: Yolonda Kida, MD;  Location: [place] [facility] LAB;  Service: Cardiovascular;  Laterality: N/A;   MASTECTOMY Right    partial   PARTIAL MASTECTOMY WITH NEEDLE LOCALIZATION Right 03/25/2017   Procedure: PARTIAL MASTECTOMY WITH NEEDLE LOCALIZATION;  Surgeon: Herbert Pun, MD;  Location: [facility] ORS;  Service: General;  Laterality: Right;   SENTINEL NODE BIOPSY Right 03/25/2017   Procedure: SENTINEL NODE BIOPSY;  Surgeon: Herbert Pun, MD;  Location: [facility] ORS;  Service: General;  Laterality: Right;   TRIGGER FINGER RELEASE Bilateral     FAMILY HISTORY: Family History  Problem Relation Age of Onset   Breast cancer Paternal Aunt        post men.    Breast cancer Cousin    Lymphoma Father    Leukemia Maternal Aunt    Cervical cancer Paternal Grandmother        d. 12s   Breast cancer Cousin        dx 34   Cervical cancer Cousin    Kidney cancer Cousin     ADVANCED DIRECTIVES (Y/N):  N  HEALTH MAINTENANCE: Social History   Tobacco Use   Smoking status: Never   Smokeless tobacco: Never  Vaping Use   Vaping Use: Never used  Substance Use Topics   Alcohol use: No   Drug use: No     Colonoscopy:  PAP:  Bone density:  Lipid panel:  Allergies  Allergen Reactions   Atorvastatin Other (See Comments)    Leg cramps.   Brilinta [Ticagrelor]    Erythromycin Diarrhea   Metoprolol Other (See  Comments)    Hypotension    Other Diarrhea and Other (See Comments)    All mycin antibiotics    Red Yeast Rice [Cholestin] Other (See Comments)    GI discomfort    Statins Other (See Comments)    Myalgia, cramps    Sulfamethoxazole-Trimethoprim Other (See Comments)    Current Outpatient Medications  Medication Sig Dispense Refill   albuterol (PROVENTIL) (2.5 MG/3ML) 0.083% nebulizer solution Inhale into the lungs.     anastrozole (ARIMIDEX) 1 MG tablet Take 1 tablet (1 mg total) by mouth daily. 90 tablet 0   aspirin EC 81 MG tablet Take 81 mg by mouth daily.      budesonide (PULMICORT) 0.25 MG/2ML nebulizer solution Take 0.25 mg by nebulization 2 (two) times daily as needed.     Calcium Carbonate-Simethicone 750-80 MG CHEW Chew 1 tablet by mouth daily as needed (heartburn).     Calcium Carbonate-Vitamin D 600-400 MG-UNIT tablet Take 2 tablets by mouth daily.  Camphor-Menthol-Capsicum 80-24-16 MG PTCH Place 1-3 patches onto the skin daily as needed (pain).      cholecalciferol (VITAMIN D) 400 units TABS tablet Take 400 Units by mouth at bedtime.     docusate sodium (COLACE) 100 MG capsule Take 100 mg by mouth daily as needed (for constipation).     ezetimibe (ZETIA) 10 MG tablet Take 10 mg by mouth daily.     ipratropium (ATROVENT) 0.06 % nasal spray Place 2 sprays into both nostrils 4 (four) times daily. 15 mL 12   METAMUCIL FIBER PO Take by mouth as needed.     metoprolol succinate (TOPROL-XL) 25 MG 24 hr tablet Take 0.5 tablet (12.5 mg) by mouth twice daily. Please call the office at [phone] to schedule an office visit for further refills. Thank you. 90 tablet 0   Multiple Vitamins-Minerals (ICAPS AREDS 2 PO) Take by mouth 2 (two) times daily.     nitroGLYCERIN (NITROSTAT) 0.4 MG SL tablet Place 0.4 mg under the tongue every 5 (five) minutes as needed for chest pain.      pantoprazole (PROTONIX) 40 MG tablet Take by mouth.     phenazopyridine (PYRIDIUM) 200 MG tablet Take 1  tablet (200 mg total) by mouth 3 (three) times daily. 6 tablet 0   Polyvinyl Alcohol-Povidone PF 1.4-0.6 % SOLN Place 1-2 drops into both eyes 3 (three) times daily as needed (for dry eyes.).     REPATHA SURECLICK 948 MG/ML SOAJ Inject 140 mg into the skin every 14 (fourteen) days. 2 mL 4   trolamine salicylate (ASPERCREME) 10 % cream Apply 1 application topically 4 (four) times daily as needed for muscle pain.     UNABLE TO FIND CBD Oil     vitamin B-12 (CYANOCOBALAMIN) 1000 MCG tablet Take 1,000 mcg by mouth at bedtime.     Vitamin D, Ergocalciferol, (DRISDOL) 1.25 MG (50000 UT) CAPS capsule Take by mouth.     benzonatate (TESSALON) 100 MG capsule Take 2 capsules (200 mg total) by mouth every 8 (eight) hours. (Patient not taking: Reported on 03/08/2022) 21 capsule 0   nitrofurantoin, macrocrystal-monohydrate, (MACROBID) 100 MG capsule Take 1 capsule (100 mg total) by mouth 2 (two) times daily. (Patient not taking: Reported on 03/08/2022) 10 capsule 0   PARoxetine (PAXIL) 40 MG tablet Take 40 mg by mouth at bedtime. (Patient not taking: Reported on 03/08/2022)     promethazine-dextromethorphan (PROMETHAZINE-DM) 6.25-15 MG/5ML syrup Take 5 mLs by mouth 4 (four) times daily as needed. (Patient not taking: Reported on 03/08/2022) 118 mL 0   No current facility-administered medications for this visit.    OBJECTIVE: Vitals:   03/08/22 1420  BP: 113/71  Pulse: 98  Resp: 18  Temp: 98 F (36.7 C)  SpO2: 96%     Body mass index is 39.32 kg/m.    ECOG FS:0 - Asymptomatic  General: Well-developed, well-nourished, no acute distress. Eyes: Pink conjunctiva, anicteric sclera. HEENT: Normocephalic, moist mucous membranes. Breast: Exam deferred today. Lungs: No audible wheezing or coughing. Heart: Regular rate and rhythm. Abdomen: Soft, nontender, no obvious distention. Musculoskeletal: No edema, cyanosis, or clubbing. Neuro: Alert, answering all questions appropriately. Cranial nerves grossly  intact. Skin: No rashes or petechiae noted. Psych: Normal affect.  LAB RESULTS:  Lab Results  Component Value Date   NA 138 02/20/2022   K 3.5 02/20/2022   CL 100 02/20/2022   CO2 25 02/20/2022   GLUCOSE 117 (H) 02/20/2022   BUN 25 (H) 02/20/2022   CREATININE 0.65  02/20/2022   CALCIUM 9.5 02/20/2022   PROT 7.1 02/20/2022   ALBUMIN 4.1 02/20/2022   AST 23 02/20/2022   ALT 21 02/20/2022   ALKPHOS 41 02/20/2022   BILITOT 0.6 02/20/2022   GFRNONAA >60 02/20/2022   GFRAA >60 01/20/2017    Lab Results  Component Value Date   WBC 9.4 02/20/2022   NEUTROABS 6.0 02/20/2022   HGB 14.7 02/20/2022   HCT 43.8 02/20/2022   MCV 94.2 02/20/2022   PLT 346 02/20/2022     STUDIES: CT ANGIO HEAD NECK W WO CM  Result Date: 02/21/2022 CLINICAL DATA:  Left-sided headache radiating down the neck, shoulder, and back, intermittent. Evaluate for dissection. EXAM: CT ANGIOGRAPHY HEAD AND NECK TECHNIQUE: Multidetector CT imaging of the head and neck was performed using the standard protocol during bolus administration of intravenous contrast. Multiplanar CT image reconstructions and MIPs were obtained to evaluate the vascular anatomy. Carotid stenosis measurements (when applicable) are obtained utilizing NASCET criteria, using the distal internal carotid diameter as the denominator. RADIATION DOSE REDUCTION: This exam was performed according to the departmental dose-optimization program which includes automated exposure control, adjustment of the mA and/or kV according to patient size and/or use of iterative reconstruction technique. CONTRAST:  30m OMNIPAQUE IOHEXOL 350 MG/ML SOLN COMPARISON:  Head CT from yesterday FINDINGS: CTA NECK FINDINGS Aortic arch: Unremarkable.  Three vessel branching Right carotid system: Partial retropharyngeal course. Calcified plaque at the bifurcation without flow limiting stenosis, ulceration, or beading. ICA tortuosity with partial retropharyngeal course. Left carotid  system: Mild for age atheromatous plaque at the bifurcation. 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 No flow limiting stenosis, ulceration, or dissection. Vertebral arteries: No proximal subclavian stenosis. Both vertebral arteries are smoothly contoured and widely patent to the dura. Skeleton: No acute finding Other neck: Prevertebral soft tissue calcification in the left para median level at C1-2, present although progressed in size from September 2022 CT. Upper chest: No acute finding Review of the MIP images confirms the above findings CTA HEAD FINDINGS Anterior circulation: Atheromatous irregularity of intracranial branches without proximal in flow limiting stenosis. No major branch occlusion. Negative for aneurysm. Posterior circulation: Mild atheromatous irregularity of intracranial branches. Calcified plaque notably affects the right vertebral artery, the non dominant side. No branch occlusion or beading. Negative for aneurysm Venous sinuses: Unremarkable Anatomic variants: As above Review of the MIP images confirms the above findings IMPRESSION: 1. No emergent vascular finding. Ordinary atheromatous changes for age. 2. 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 Electronically Signed   By: JJorje GuildM.D.   On: 02/21/2022 05:08   DG Chest Port 1 View  Result Date: 02/20/2022 CLINICAL DATA:  10031 Cough 10031 EXAM: PORTABLE CHEST 1 VIEW COMPARISON:  Chest x-ray 02/04/2011 FINDINGS: The heart and mediastinal contours are unchanged. Aortic calcification No focal consolidation. No pulmonary edema. No pleural effusion. No pneumothorax. No acute osseous abnormality. IMPRESSION: No active disease. Electronically Signed   By: MIven FinnM.D.   On: 02/20/2022 20:51   CT HEAD WO CONTRAST  Result Date: 02/20/2022 CLINICAL DATA:   Transient ischemic attack (TIA) EXAM: CT HEAD WITHOUT CONTRAST TECHNIQUE: Contiguous axial images were obtained from the base of the skull through the vertex without intravenous contrast. RADIATION DOSE REDUCTION: This exam was performed according to the departmental dose-optimization program which includes automated exposure  control, adjustment of the mA and/or kV according to patient size and/or use of iterative reconstruction technique. COMPARISON:  CT head 11/12/2020 FINDINGS: Brain: No evidence of large-territorial acute infarction. No parenchymal hemorrhage. No mass lesion. No extra-axial collection. Similar-appearing bilateral chronic hygromas. No mass effect or midline shift. No hydrocephalus. Basilar cisterns are patent. Vascular: No hyperdense vessel. Skull: No acute fracture or focal lesion. Sinuses/Orbits: Paranasal sinuses and mastoid air cells are clear. Bilateral lens replacement. Otherwise the orbits are unremarkable. Other: None. IMPRESSION: No acute intracranial abnormality. Electronically Signed   By: Iven Finn M.D.   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  Oncotype DX score 0.  PLAN:    1. 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  Because she had an Oncotype DX score of 0 and was low risk, she did not require adjuvant chemotherapy.  She has completed adjuvant XRT.  Patient has now discontinued all aromatase inhibitors secondary to side effects.  She only completed approximately 4-1/2 of the 5 years of recommended treatment.  No further intervention is needed.  Patient's most recent mammogram on September 29, 2021 was reported as BI-RADS 1.  Repeat in August 2024.  Return to clinic 2 to 3 days after mammogram.  If that mammogram continues to be within  normal limits, patient could possibly be discharged from clinic.  2.  Osteopenia: Patient had bone mineral density on Jul 19, 2020 with a reported T score of -1.3.  This is slightly worse than May 2019 where her T score was reported -1.0.  Recommended calcium and vitamin D supplementation.  I recommended off further bone densities to be monitored by primary care.   3.  Hot flashes/mood swings: Resolved. 4.  Visual disturbance/headaches: Unclear etiology.  Patient reports a full workup with no distinct cause.  Patient expressed understanding and was in agreement with this plan. She also understands that She can call clinic at any time with any questions, concerns, or complaints.    Cancer Staging  Primary cancer of upper outer quadrant of right female breast [facility]) Staging form: Breast, AJCC 8th Edition - Clinical stage from 03/14/2017: Stage IA (cT1b, cN0, cM0, G2, ER+, PR+, HER2-) - Signed by Lloyd Huger, MD on 03/15/2017 Histologic grading system: 3 grade system Laterality: Right   Lloyd Huger, MD   03/08/2022 2:33 PM

## 2022-04-04 MED ORDER — PROPOFOL 10 MG/ML IV BOLUS
INTRAVENOUS | Status: AC
  Filled 2022-04-04: qty 40

## 2022-04-12 ENCOUNTER — Other Ambulatory Visit: Payer: Self-pay | Admitting: Oncology

## 2022-04-12 ENCOUNTER — Other Ambulatory Visit: Payer: Self-pay | Admitting: Cardiovascular Disease

## 2022-06-04 NOTE — Progress Notes (Unsigned)
Cardiology Office Note  Date:  06/05/2022   ID:  CHANIAH Yang, DOB 04/03/1940, MRN [id number]  PCP:  Marisue Ivan, MD   Chief Complaint  Patient presents with   12 month follow up     Patient c/o shortness of breath with over exertion and upper back pain. Medications reviewed by the patient verbally.     HPI:  [name] is a 82 year old woman with past medical history of CAD,  s/p PCI/DES to proximal LAD (2018),  hypertension,  hyperlipidemia,  asthma,  obesity,  Sleep apnea, did not tolerate CPAP DDD, h/o breast cancer, fatty liver, recent diagnosis of macular degeneration and anxiety  Who presents for f/u of her coronary disease, getting over covid  Last seen by myself in clinic March 28, 2021  Got covid 3/24 Feeling better, still not back to her baseline  Palpitations Stomach full Mild thoracic back discomfort Denies chest pain concerning for angina No regular exercise program  Lab work reviewed Total chol 96, LDL 4 on Repatha  EKG personally reviewed by myself on todays visit Normal sinus rhythm rate 75 bpm no significant ST-T wave changes  History of coronary disease, stenting to proximal LAD 2018 Cardiac catheterization results as requested, reviewed, residual 60% proximal left circumflex disease, 40% left main disease   She reports History of falls, seen in the emergency room Echocardiogram 2D complete: (09/21/2019) reviewed on today's visit NORMAL LEFT VENTRICULAR SYSTOLIC FUNCTION WITH MILD LVH NORMAL RIGHT VENTRICULAR SYSTOLIC FUNCTION TRIVIAL REGURGITATION NOTED (See above) NO VALVULAR STENOSIS TRIVIAL MR, TR EF 55%  Risk factors Nonsmoker No diabetes Cholesterol now at goal on Repatha   PMH:   has a past medical history of Allergies, Anginal pain, Anxiety, Arthritis, Asthma, Breast cancer (02/2017), Complication of anesthesia, Coronary artery disease, Dyspnea, Family history of breast cancer, Family history of kidney cancer, Family  history of leukemia, Family history of lymphoma, Fatty liver, GERD (gastroesophageal reflux disease), Glaucoma, Headache, High cholesterol, Hypertension, Macular degeneration, Myocardial infarction, Personal history of radiation therapy, PONV (postoperative nausea and vomiting), and Sleep apnea.  PSH:    Past Surgical History:  Procedure Laterality Date   ABDOMINAL HYSTERECTOMY     ACHILLES TENDON SURGERY     BREAST BIOPSY Right 03/05/2017   Affirm Bx- invasive mammary   BREAST LUMPECTOMY Right 03/25/2017   invasive mammary, clear margins, LN negative   BUNIONECTOMY Bilateral    CATARACT EXTRACTION W/PHACO Left 01/29/2017   Procedure: CATARACT EXTRACTION PHACO AND INTRAOCULAR LENS PLACEMENT (IOC);  Surgeon: Galen Manila, MD;  Location: [facility];  Service: Ophthalmology;  Laterality: Left;  [place] 00:49.9 AP% 20.3 CDE 10.11 Fluid Pack lot # [id number]   CATARACT EXTRACTION W/PHACO Right 08/30/2020   Procedure: CATARACT EXTRACTION PHACO AND INTRAOCULAR LENS PLACEMENT (IOC) RIGHT 11.15 01:09.0;  Surgeon: Galen Manila, MD;  Location: [facility];  Service: Ophthalmology;  Laterality: Right;  DEXTENZA   COLONOSCOPY WITH PROPOFOL N/A 08/08/2021   Procedure: COLONOSCOPY WITH PROPOFOL;  Surgeon: Regis Bill, MD;  Location: [facility];  Service: Endoscopy;  Laterality: N/A;   CORONARY ANGIOPLASTY     STENT 5/18   CORONARY PRESSURE/FFR STUDY N/A 07/18/2016   Procedure: Intravascular Pressure Wire/FFR Study;  Surgeon: Alwyn Pea, MD;  Location: [facility];  Service: Cardiovascular;  Laterality: N/A;   CORONARY STENT INTERVENTION N/A 07/18/2016   Procedure: Coronary Stent Intervention;  Surgeon: Alwyn Pea, MD;  Location: [facility];  Service: Cardiovascular;  Laterality: N/A;   CTR  ESOPHAGOGASTRODUODENOSCOPY (EGD) WITH PROPOFOL N/A 08/08/2021   Procedure: ESOPHAGOGASTRODUODENOSCOPY (EGD) WITH PROPOFOL;  Surgeon: Regis Bill, MD;   Location: [facility];  Service: Endoscopy;  Laterality: N/A;   LEFT HEART CATH AND CORONARY ANGIOGRAPHY N/A 07/18/2016   Procedure: Left Heart Cath and Coronary Angiography;  Surgeon: Alwyn Pea, MD;  Location: [facility];  Service: Cardiovascular;  Laterality: N/A;   MASTECTOMY Right    partial   PARTIAL MASTECTOMY WITH NEEDLE LOCALIZATION Right 03/25/2017   Procedure: PARTIAL MASTECTOMY WITH NEEDLE LOCALIZATION;  Surgeon: Carolan Shiver, MD;  Location: [facility];  Service: General;  Laterality: Right;   SENTINEL NODE BIOPSY Right 03/25/2017   Procedure: SENTINEL NODE BIOPSY;  Surgeon: Carolan Shiver, MD;  Location: [facility];  Service: General;  Laterality: Right;   TRIGGER FINGER RELEASE Bilateral     Current Outpatient Medications  Medication Sig Dispense Refill   albuterol (PROVENTIL) (2.5 MG/3ML) 0.083% nebulizer solution Inhale into the lungs.     aspirin EC 81 MG tablet Take 81 mg by mouth daily.      budesonide (PULMICORT) 0.25 MG/2ML nebulizer solution Take 0.25 mg by nebulization 2 (two) times daily as needed.     busPIRone (BUSPAR) 5 MG tablet Take 5 mg by mouth 2 (two) times daily.     Calcium Carbonate-Simethicone 750-80 MG CHEW Chew 1 tablet by mouth daily as needed (heartburn).     Calcium Carbonate-Vitamin D 600-400 MG-UNIT tablet Take 2 tablets by mouth daily.     Camphor-Menthol-Capsicum 80-24-16 MG PTCH Place 1-3 patches onto the skin daily as needed (pain).      cholecalciferol (VITAMIN D) 400 units TABS tablet Take 400 Units by mouth at bedtime.     Evolocumab (REPATHA SURECLICK) 140 MG/ML SOAJ Inject 140 mg into the skin every 14 (fourteen) days. 2 mL 2   ipratropium (ATROVENT) 0.06 % nasal spray Place 2 sprays into both nostrils 4 (four) times daily. 15 mL 12   METAMUCIL FIBER PO Take by mouth as needed.     Multiple Vitamins-Minerals (ICAPS AREDS 2 PO) Take by mouth 2 (two) times daily.     nitrofurantoin, macrocrystal-monohydrate,  (MACROBID) 100 MG capsule Take 1 capsule (100 mg total) by mouth 2 (two) times daily. 10 capsule 0   nitroGLYCERIN (NITROSTAT) 0.4 MG SL tablet Place 0.4 mg under the tongue every 5 (five) minutes as needed for chest pain.      PARoxetine (PAXIL) 40 MG tablet Take 40 mg by mouth at bedtime.     phenazopyridine (PYRIDIUM) 200 MG tablet Take 1 tablet (200 mg total) by mouth 3 (three) times daily. 6 tablet 0   Polyvinyl Alcohol-Povidone PF 1.4-0.6 % SOLN Place 1-2 drops into both eyes 3 (three) times daily as needed (for dry eyes.).     promethazine-dextromethorphan (PROMETHAZINE-DM) 6.25-15 MG/5ML syrup Take 5 mLs by mouth 4 (four) times daily as needed. 118 mL 0   trolamine salicylate (ASPERCREME) 10 % cream Apply 1 application topically 4 (four) times daily as needed for muscle pain.     UNABLE TO FIND CBD Oil     vitamin B-12 (CYANOCOBALAMIN) 1000 MCG tablet Take 1,000 mcg by mouth at bedtime.     anastrozole (ARIMIDEX) 1 MG tablet Take 1 tablet (1 mg total) by mouth daily. (Patient not taking: Reported on 06/05/2022) 90 tablet 0   benzonatate (TESSALON) 100 MG capsule Take 2 capsules (200 mg total) by mouth every 8 (eight) hours. (Patient not taking: Reported on 03/08/2022) 21 capsule  0   docusate sodium (COLACE) 100 MG capsule Take 100 mg by mouth daily as needed (for constipation). (Patient not taking: Reported on 06/05/2022)     metoprolol succinate (TOPROL-XL) 25 MG 24 hr tablet Take one pill once a day 90 tablet 3   No current facility-administered medications for this visit.    Allergies:   Atorvastatin, Brilinta [ticagrelor], Erythromycin, Metoprolol, Other, Red yeast rice [cholestin], Statins, and Sulfamethoxazole-trimethoprim   Social History:  The patient  reports that she has never smoked. She has never used smokeless tobacco. She reports that she does not drink alcohol and does not use drugs.   Family History:   family history includes Breast cancer in her cousin, cousin, and paternal  aunt; Cervical cancer in her cousin and paternal grandmother; Kidney cancer in her cousin; Leukemia in her maternal aunt; Lymphoma in her father.    Review of Systems: Review of Systems  Constitutional: Negative.   HENT: Negative.    Respiratory: Negative.    Cardiovascular: Negative.   Gastrointestinal: Negative.   Musculoskeletal: Negative.   Neurological: Negative.   Psychiatric/Behavioral: Negative.    All other systems reviewed and are negative.   PHYSICAL EXAM: VS:  BP 128/78 (BP Location: Left Arm, Patient Position: Sitting, Cuff Size: Normal)   Pulse 75   Ht 5\' 2"  (1.575 m)   Wt 217 lb 8 oz (98.7 kg)   SpO2 95%   BMI 39.78 kg/m  , BMI Body mass index is 39.78 kg/m. Constitutional:  oriented to person, place, and time. No distress.  Obese HENT:  Head: Grossly normal Eyes:  no discharge. No scleral icterus.  Neck: No JVD, no carotid bruits  Cardiovascular: Regular rate and rhythm, no murmurs appreciated Pulmonary/Chest: Clear to auscultation bilaterally, no wheezes or rails Abdominal: Soft.  no distension.  no tenderness.  Musculoskeletal: Normal range of motion Neurological:  normal muscle tone. Coordination normal. No atrophy Skin: Skin warm and dry Psychiatric: normal affect, pleasant  Recent Labs: 02/20/2022: ALT 21; BUN 25; Creatinine, Ser 0.65; Hemoglobin 14.7; Platelets 346; Potassium 3.5; Sodium 138    Lipid Panel Lab Results  Component Value Date   CHOL 236 (H) 08/05/2009   HDL 45.90 08/05/2009   TRIG 214.0 (H) 08/05/2009      Wt Readings from Last 3 Encounters:  06/05/22 217 lb 8 oz (98.7 kg)  03/08/22 215 lb (97.5 kg)  02/20/22 215 lb (97.5 kg)     ASSESSMENT AND PLAN:  Problem List Items Addressed This Visit       Cardiology Problems   Essential hypertension   Relevant Medications   metoprolol succinate (TOPROL-XL) 25 MG 24 hr tablet   Other Relevant Orders   EKG 12-Lead   Coronary artery disease involving native coronary artery of  native heart - Primary   Relevant Medications   metoprolol succinate (TOPROL-XL) 25 MG 24 hr tablet   Other Relevant Orders   EKG 12-Lead   Mixed hyperlipidemia   Relevant Medications   metoprolol succinate (TOPROL-XL) 25 MG 24 hr tablet   Other Relevant Orders   EKG 12-Lead     Other   PALPITATIONS   Relevant Orders   EKG 12-Lead   Other Visit Diagnoses     Myalgia due to statin       Morbid obesity         Coronary disease with stable angina Prior stenting to proximal LAD, residual left circumflex disease and 40% left main disease 2018 Currently with no symptoms of angina.  No further workup at this time. Continue current medication regimen. Non-smoker, no diabetes, cholesterol at goal Atypical symptoms on today's visit, no further workup ordered  Hyperlipidemia Continue Repatha 140 subcu every other week Continue Zetia Numbers at goal  Essential hypertension Blood pressure is well controlled on today's visit. No changes made to the medications. Will change metoprolol succinate to 25 mg daily from 12.5 twice daily  Morbid obesity BMI 40 Recommend low carbohydrate diet, walking program  Sleep apnea Not on CPAP, reports she did not tolerate Weight loss recommended   Total encounter time more than 30 minutes  Greater than 50% was spent in counseling and coordination of care with the patient    Signed, Dossie Arbour, M.D., Ph.D. [facility] [place], [place] [phone]

## 2022-06-05 ENCOUNTER — Encounter: Payer: Self-pay | Admitting: Cardiovascular Disease

## 2022-06-05 ENCOUNTER — Ambulatory Visit: Payer: Medicare Other | Attending: Cardiovascular Disease | Admitting: Cardiovascular Disease

## 2022-06-05 VITALS — BP 128/78 | HR 75 | Ht 62.0 in | Wt 217.5 lb

## 2022-06-05 DIAGNOSIS — E782 Mixed hyperlipidemia: Secondary | ICD-10-CM | POA: Diagnosis not present

## 2022-06-05 DIAGNOSIS — R002 Palpitations: Secondary | ICD-10-CM

## 2022-06-05 DIAGNOSIS — I10 Essential (primary) hypertension: Secondary | ICD-10-CM

## 2022-06-05 DIAGNOSIS — I25118 Atherosclerotic heart disease of native coronary artery with other forms of angina pectoris: Secondary | ICD-10-CM | POA: Diagnosis not present

## 2022-06-05 DIAGNOSIS — T466X5D Adverse effect of antihyperlipidemic and antiarteriosclerotic drugs, subsequent encounter: Secondary | ICD-10-CM

## 2022-06-05 DIAGNOSIS — M791 Myalgia, unspecified site: Secondary | ICD-10-CM

## 2022-06-05 DIAGNOSIS — T466X5A Adverse effect of antihyperlipidemic and antiarteriosclerotic drugs, initial encounter: Secondary | ICD-10-CM

## 2022-06-05 MED ORDER — METOPROLOL SUCCINATE ER 25 MG PO TB24: ORAL_TABLET | ORAL | 3 refills | Status: DC

## 2022-06-05 NOTE — Patient Instructions (Signed)
Medication Instructions:  Please change metoprolol succinate to one pill once a day  If you need a refill on your cardiac medications before your next appointment, please call your pharmacy.   Lab work: No new labs needed  Testing/Procedures: No new testing needed  Follow-Up: At [facility], you and your health needs are our priority.  As part of our continuing mission to provide you with exceptional heart care, we have created designated Provider Care Teams.  These Care Teams include your primary Cardiologist (physician) and Advanced Practice Providers (APPs -  Physician Assistants and Nurse Practitioners) who all work together to provide you with the care you need, when you need it.  You will need a follow up appointment in 12 months  Providers on your designated Care Team:   Nicolasa Ducking, NP Eula Listen, PA-C Cadence Fransico Michael, [place]  COVID-19 Vaccine Information can be found at: [email or web address] For questions related to vaccine distribution or appointments, please email [email or web address] or call [phone].

## 2022-07-10 ENCOUNTER — Ambulatory Visit: Payer: Medicare Other | Admitting: Dermatology

## 2022-07-10 DIAGNOSIS — W908XXA Exposure to other nonionizing radiation, initial encounter: Secondary | ICD-10-CM

## 2022-07-10 DIAGNOSIS — L820 Inflamed seborrheic keratosis: Secondary | ICD-10-CM | POA: Diagnosis not present

## 2022-07-10 DIAGNOSIS — D492 Neoplasm of unspecified behavior of bone, soft tissue, and skin: Secondary | ICD-10-CM

## 2022-07-10 DIAGNOSIS — X32XXXA Exposure to sunlight, initial encounter: Secondary | ICD-10-CM

## 2022-07-10 DIAGNOSIS — L814 Other melanin hyperpigmentation: Secondary | ICD-10-CM

## 2022-07-10 DIAGNOSIS — D485 Neoplasm of uncertain behavior of skin: Secondary | ICD-10-CM | POA: Diagnosis not present

## 2022-07-10 DIAGNOSIS — L578 Other skin changes due to chronic exposure to nonionizing radiation: Secondary | ICD-10-CM

## 2022-07-10 NOTE — Addendum Note (Signed)
Addended by: Willeen Niece on: 07/10/2022 08:32 PM   Modules accepted: Level of Service

## 2022-07-10 NOTE — Patient Instructions (Signed)
Due to recent changes in healthcare laws, you may see results of your pathology and/or laboratory studies on MyChart before the doctors have had a chance to review them. We understand that in some cases there may be results that are confusing or concerning to you. Please understand that not all results are received at the same time and often the doctors may need to interpret multiple results in order to provide you with the best plan of care or course of treatment. Therefore, we ask that you please give us 2 business days to thoroughly review all your results before contacting the office for clarification. Should we see a critical lab result, you will be contacted sooner.   If You Need Anything After Your Visit  If you have any questions or concerns for your doctor, please call our main line at [phone] and press option 4 to reach your doctor's medical assistant. If no one answers, please leave a voicemail as directed and we will return your call as soon as possible. Messages left after 4 pm will be answered the following business day.   You may also send us a message via MyChart. We typically respond to MyChart messages within 1-2 business days.  For prescription refills, please ask your pharmacy to contact our office. Our fax number is [phone].  If you have an urgent issue when the clinic is closed that cannot wait until the next business day, you can page your doctor at the number below.    Please note that while we do our best to be available for urgent issues outside of office hours, we are not available 24/7.   If you have an urgent issue and are unable to reach us, you may choose to seek medical care at your doctor's office, retail clinic, urgent care center, or emergency room.  If you have a medical emergency, please immediately call 911 or go to the emergency department.  Pager Numbers  - Dr. Kowalski: [phone]  - Dr. Moye: [phone]  - Dr. Stewart:  [phone]  In the event of inclement weather, please call our main line at [phone] for an update on the status of any delays or closures.  Dermatology Medication Tips: Please keep the boxes that topical medications come in in order to help keep track of the instructions about where and how to use these. Pharmacies typically print the medication instructions only on the boxes and not directly on the medication tubes.   If your medication is too expensive, please contact our office at [phone] option 4 or send us a message through MyChart.   We are unable to tell what your co-pay for medications will be in advance as this is different depending on your insurance coverage. However, we may be able to find a substitute medication at lower cost or fill out paperwork to get insurance to cover a needed medication.   If a prior authorization is required to get your medication covered by your insurance company, please allow us 1-2 business days to complete this process.  Drug prices often vary depending on where the prescription is filled and some pharmacies may offer cheaper prices.  The website www.goodrx.com contains coupons for medications through different pharmacies. The prices here do not account for what the cost may be with help from insurance (it may be cheaper with your insurance), but the website can give you the price if you did not use any insurance.  - You can print the associated coupon and take it with   your prescription to the pharmacy.  - You may also stop by our office during regular business hours and pick up a GoodRx coupon card.  - If you need your prescription sent electronically to a different pharmacy, notify our office through  MyChart or by phone at [phone] option 4.     Si Usted Necesita Algo Despus de Su Visita  Tambin puede enviarnos un mensaje a travs de MyChart. Por lo general respondemos a los mensajes de MyChart en el transcurso de 1 a 2  das hbiles.  Para renovar recetas, por favor pida a su farmacia que se ponga en contacto con nuestra oficina. Nuestro nmero de fax es el [phone].  Si tiene un asunto urgente cuando la clnica est cerrada y que no puede esperar hasta el siguiente da hbil, puede llamar/localizar a su doctor(a) al nmero que aparece a continuacin.   Por favor, tenga en cuenta que aunque hacemos todo lo posible para estar disponibles para asuntos urgentes fuera del horario de oficina, no estamos disponibles las 24 horas del da, los 7 das de la semana.   Si tiene un problema urgente y no puede comunicarse con nosotros, puede optar por buscar atencin mdica  en el consultorio de su doctor(a), en una clnica privada, en un centro de atencin urgente o en una sala de emergencias.  Si tiene una emergencia mdica, por favor llame inmediatamente al 911 o vaya a la sala de emergencias.  Nmeros de bper  - Dr. Kowalski: [phone]  - Dra. Moye: [phone]  - Dra. Stewart: [phone]  En caso de inclemencias del tiempo, por favor llame a nuestra lnea principal al [phone] para una actualizacin sobre el estado de cualquier retraso o cierre.  Consejos para la medicacin en dermatologa: Por favor, guarde las cajas en las que vienen los medicamentos de uso tpico para ayudarle a seguir las instrucciones sobre dnde y cmo usarlos. Las farmacias generalmente imprimen las instrucciones del medicamento slo en las cajas y no directamente en los tubos del medicamento.   Si su medicamento es muy caro, por favor, pngase en contacto con nuestra oficina llamando al [phone] y presione la opcin 4 o envenos un mensaje a travs de MyChart.   No podemos decirle cul ser su copago por los medicamentos por adelantado ya que esto es diferente dependiendo de la cobertura de su seguro. Sin embargo, es posible que podamos encontrar un medicamento sustituto a menor costo o llenar un formulario para que el  seguro cubra el medicamento que se considera necesario.   Si se requiere una autorizacin previa para que su compaa de seguros cubra su medicamento, por favor permtanos de 1 a 2 das hbiles para completar este proceso.  Los precios de los medicamentos varan con frecuencia dependiendo del lugar de dnde se surte la receta y alguna farmacias pueden ofrecer precios ms baratos.  El sitio web www.goodrx.com tiene cupones para medicamentos de diferentes farmacias. Los precios aqu no tienen en cuenta lo que podra costar con la ayuda del seguro (puede ser ms barato con su seguro), pero el sitio web puede darle el precio si no utiliz ningn seguro.  - Puede imprimir el cupn correspondiente y llevarlo con su receta a la farmacia.  - Tambin puede pasar por nuestra oficina durante el horario de atencin regular y recoger una tarjeta de cupones de GoodRx.  - Si necesita que su receta se enve electrnicamente a una farmacia diferente, informe a nuestra oficina a travs de MyChart de    o por telfono llamando al [phone] y presione la opcin 4.  

## 2022-07-10 NOTE — Progress Notes (Signed)
   New Patient Visit   Subjective  Linda Yang is a 82 y.o. female who presents for the following:  The patient has spots, moles and lesions to be evaluated, some may be new or changing and the patient has concerns that these could be cancer. Some spots are sore and irritated on face and back.   The following portions of the chart were reviewed this encounter and updated as appropriate: medications, allergies, medical history  Review of Systems:  No other skin or systemic complaints except as noted in HPI or Assessment and Plan.  Objective  Well appearing patient in no apparent distress; mood and affect are within normal limits.  A focused examination was performed of the following areas:face,back   Relevant physical exam findings are noted in the Assessment and Plan.  right mid upper back 4.0 mm flesh papule        right mid cheek 1.8 x 1.5 brown speckled patch       left upper forehead x 1, left lower cheek x 1 (2) Stuck-on, waxy, tan-brown papules  --Discussed benign etiology and prognosis.    Assessment & Plan   Neoplasm of skin (2) right mid upper back  right mid cheek  Patient decline biopsy shave removal procedure today due to event tonight, she will return to the office in 3 weeks for procedures.  We request previous pathology results from [facility] diagnostic of the right cheek. Verbal is solar lentigo R cheek.  Will repeat biopsy R cheek due to irregular appearance. Shave removal of irritated nevus on back on f/up    Inflamed seborrheic keratosis (2) left upper forehead x 1, left lower cheek x 1  Symptomatic, irritating, patient would like treated.   Destruction of lesion - left upper forehead x 1, left lower cheek x 1  Destruction method: cryotherapy   Informed consent: discussed and consent obtained   Lesion destroyed using liquid nitrogen: Yes   Region frozen until ice ball extended beyond lesion: Yes   Outcome: patient tolerated procedure  well with no complications   Post-procedure details: wound care instructions given   Additional details:  Prior to procedure, discussed risks of blister formation, small wound, skin dyspigmentation, or rare scar following cryotherapy. Recommend Vaseline ointment to treated areas while healing.   ACTINIC DAMAGE - chronic, secondary to cumulative UV radiation exposure/sun exposure over time - diffuse scaly erythematous macules with underlying dyspigmentation - Recommend daily broad spectrum sunscreen SPF 30+ to sun-exposed areas, reapply every 2 hours as needed.  - Recommend staying in the shade or wearing long sleeves, sun glasses (UVA+UVB protection) and wide brim hats (4-inch brim around the entire circumference of the hat). - Call for new or changing lesions.   LENTIGINES Exam: scattered tan macules Due to sun exposure Treatment Plan: Benign-appearing, observe. Recommend daily broad spectrum sunscreen SPF 30+ to sun-exposed areas, reapply every 2 hours as needed.  Call for any changes   Return for for biopsies.  I, Angelique Holm, CMA, am acting as scribe for Willeen Niece, MD .   Documentation: I have reviewed the above documentation for accuracy and completeness, and I agree with the above.  Willeen Niece, MD

## 2022-07-26 ENCOUNTER — Other Ambulatory Visit: Payer: Self-pay | Admitting: Cardiovascular Disease

## 2022-08-01 ENCOUNTER — Ambulatory Visit: Payer: Medicare Other | Admitting: Dermatology

## 2022-08-01 VITALS — BP 130/77 | HR 87

## 2022-08-01 DIAGNOSIS — L578 Other skin changes due to chronic exposure to nonionizing radiation: Secondary | ICD-10-CM

## 2022-08-01 DIAGNOSIS — W908XXA Exposure to other nonionizing radiation, initial encounter: Secondary | ICD-10-CM

## 2022-08-01 DIAGNOSIS — L814 Other melanin hyperpigmentation: Secondary | ICD-10-CM | POA: Diagnosis not present

## 2022-08-01 DIAGNOSIS — D485 Neoplasm of uncertain behavior of skin: Secondary | ICD-10-CM

## 2022-08-01 DIAGNOSIS — X32XXXA Exposure to sunlight, initial encounter: Secondary | ICD-10-CM

## 2022-08-01 DIAGNOSIS — D3617 Benign neoplasm of peripheral nerves and autonomic nervous system of trunk, unspecified: Secondary | ICD-10-CM

## 2022-08-01 NOTE — Patient Instructions (Signed)
Wound Care Instructions  Cleanse wound gently with soap and water once a day then pat dry with clean gauze. Apply a thin coat of Petrolatum (petroleum jelly, "Vaseline") over the wound (unless you have an allergy to this). We recommend that you use a new, sterile tube of Vaseline. Do not pick or remove scabs. Do not remove the yellow or white "healing tissue" from the base of the wound.  Cover the wound with fresh, clean, nonstick gauze and secure with paper tape. You may use Band-Aids in place of gauze and tape if the wound is small enough, but would recommend trimming much of the tape off as there is often too much. Sometimes Band-Aids can irritate the skin.  You should call the office for your biopsy report after 1 week if you have not already been contacted.  If you experience any problems, such as abnormal amounts of bleeding, swelling, significant bruising, significant pain, or evidence of infection, please call the office immediately.  FOR ADULT SURGERY PATIENTS: If you need something for pain relief you may take 1 extra strength Tylenol (acetaminophen) AND 2 Ibuprofen (200mg each) together every 4 hours as needed for pain. (do not take these if you are allergic to them or if you have a reason you should not take them.) Typically, you may only need pain medication for 1 to 3 days.     Due to recent changes in healthcare laws, you may see results of your pathology and/or laboratory studies on MyChart before the doctors have had a chance to review them. We understand that in some cases there may be results that are confusing or concerning to you. Please understand that not all results are received at the same time and often the doctors may need to interpret multiple results in order to provide you with the best plan of care or course of treatment. Therefore, we ask that you please give us 2 business days to thoroughly review all your results before contacting the office for clarification. Should  we see a critical lab result, you will be contacted sooner.   If You Need Anything After Your Visit  If you have any questions or concerns for your doctor, please call our main line at [phone] and press option 4 to reach your doctor's medical assistant. If no one answers, please leave a voicemail as directed and we will return your call as soon as possible. Messages left after 4 pm will be answered the following business day.   You may also send us a message via MyChart. We typically respond to MyChart messages within 1-2 business days.  For prescription refills, please ask your pharmacy to contact our office. Our fax number is [phone].  If you have an urgent issue when the clinic is closed that cannot wait until the next business day, you can page your doctor at the number below.    Please note that while we do our best to be available for urgent issues outside of office hours, we are not available 24/7.   If you have an urgent issue and are unable to reach us, you may choose to seek medical care at your doctor's office, retail clinic, urgent care center, or emergency room.  If you have a medical emergency, please immediately call 911 or go to the emergency department.  Pager Numbers  - Dr. Kowalski: [phone]  - Dr. Moye: [phone]  - Dr. Stewart: [phone]  In the event of inclement weather, please call our main line at   [phone] for an update on the status of any delays or closures.  Dermatology Medication Tips: Please keep the boxes that topical medications come in in order to help keep track of the instructions about where and how to use these. Pharmacies typically print the medication instructions only on the boxes and not directly on the medication tubes.   If your medication is too expensive, please contact our office at [phone] option 4 or send us a message through MyChart.   We are unable to tell what your co-pay for medications will be in  advance as this is different depending on your insurance coverage. However, we may be able to find a substitute medication at lower cost or fill out paperwork to get insurance to cover a needed medication.   If a prior authorization is required to get your medication covered by your insurance company, please allow us 1-2 business days to complete this process.  Drug prices often vary depending on where the prescription is filled and some pharmacies may offer cheaper prices.  The website www.goodrx.com contains coupons for medications through different pharmacies. The prices here do not account for what the cost may be with help from insurance (it may be cheaper with your insurance), but the website can give you the price if you did not use any insurance.  - You can print the associated coupon and take it with your prescription to the pharmacy.  - You may also stop by our office during regular business hours and pick up a GoodRx coupon card.  - If you need your prescription sent electronically to a different pharmacy, notify our office through [facility] MyChart or by phone at [phone] option 4.     Si Usted Necesita Algo Despus de Su Visita  Tambin puede enviarnos un mensaje a travs de MyChart. Por lo general respondemos a los mensajes de MyChart en el transcurso de 1 a 2 das hbiles.  Para renovar recetas, por favor pida a su farmacia que se ponga en contacto con nuestra oficina. Nuestro nmero de fax es el [phone].  Si tiene un asunto urgente cuando la clnica est cerrada y que no puede esperar hasta el siguiente da hbil, puede llamar/localizar a su doctor(a) al nmero que aparece a continuacin.   Por favor, tenga en cuenta que aunque hacemos todo lo posible para estar disponibles para asuntos urgentes fuera del horario de oficina, no estamos disponibles las 24 horas del da, los 7 das de la semana.   Si tiene un problema urgente y no puede comunicarse con nosotros, puede  optar por buscar atencin mdica  en el consultorio de su doctor(a), en una clnica privada, en un centro de atencin urgente o en una sala de emergencias.  Si tiene una emergencia mdica, por favor llame inmediatamente al 911 o vaya a la sala de emergencias.  Nmeros de bper  - Dr. Kowalski: [phone]  - Dra. Moye: [phone]  - Dra. Stewart: [phone]  En caso de inclemencias del tiempo, por favor llame a nuestra lnea principal al [phone] para una actualizacin sobre el estado de cualquier retraso o cierre.  Consejos para la medicacin en dermatologa: Por favor, guarde las cajas en las que vienen los medicamentos de uso tpico para ayudarle a seguir las instrucciones sobre dnde y cmo usarlos. Las farmacias generalmente imprimen las instrucciones del medicamento slo en las cajas y no directamente en los tubos del medicamento.   Si su medicamento es muy caro, por favor, pngase en contacto con   nuestra oficina llamando al [phone] y presione la opcin 4 o envenos un mensaje a travs de MyChart.   No podemos decirle cul ser su copago por los medicamentos por adelantado ya que esto es diferente dependiendo de la cobertura de su seguro. Sin embargo, es posible que podamos encontrar un medicamento sustituto a menor costo o llenar un formulario para que el seguro cubra el medicamento que se considera necesario.   Si se requiere una autorizacin previa para que su compaa de seguros cubra su medicamento, por favor permtanos de 1 a 2 das hbiles para completar este proceso.  Los precios de los medicamentos varan con frecuencia dependiendo del lugar de dnde se surte la receta y alguna farmacias pueden ofrecer precios ms baratos.  El sitio web www.goodrx.com tiene cupones para medicamentos de diferentes farmacias. Los precios aqu no tienen en cuenta lo que podra costar con la ayuda del seguro (puede ser ms barato con su seguro), pero el sitio web puede darle el  precio si no utiliz ningn seguro.  - Puede imprimir el cupn correspondiente y llevarlo con su receta a la farmacia.  - Tambin puede pasar por nuestra oficina durante el horario de atencin regular y recoger una tarjeta de cupones de GoodRx.  - Si necesita que su receta se enve electrnicamente a una farmacia diferente, informe a nuestra oficina a travs de MyChart de [place] o por telfono llamando al [phone] y presione la opcin 4.  

## 2022-08-01 NOTE — Progress Notes (Signed)
   Follow-Up Visit   Subjective  Linda Yang is a 82 y.o. female who presents for the following: Biopsies at the R mid upper back and R mid cheek. Spot on back is itchy/irritated.   The following portions of the chart were reviewed this encounter and updated as appropriate: medications, allergies, medical history  Review of Systems:  No other skin or systemic complaints except as noted in HPI or Assessment and Plan.  Objective  Well appearing patient in no apparent distress; mood and affect are within normal limits.  A focused examination was performed of the following areas: the face and back   Relevant exam findings are noted in the Assessment and Plan.  R mid upper back 0.4 cm flesh papule.        R mid cheek 1.8 x 1.5 brown speckled patch          Assessment & Plan     Neoplasm of uncertain behavior of skin (2) R mid upper back  Epidermal / dermal shaving  Lesion diameter (cm):  0.4 Informed consent: discussed and consent obtained   Patient was prepped and draped in usual sterile fashion: area prepped with alcohol. Anesthesia: the lesion was anesthetized in a standard fashion   Anesthetic:  1% lidocaine w/ epinephrine 1-100,000 buffered w/ 8.4% NaHCO3 Instrument used: flexible razor blade   Hemostasis achieved with: pressure, aluminum chloride and electrodesiccation   Outcome: patient tolerated procedure well   Post-procedure details: wound care instructions given   Post-procedure details comment:  Ointment and small bandage applied  Specimen 1 - Surgical pathology Differential Diagnosis: D48.5 irritated nevus r/o dysplasia vs neurofibroma Check Margins: No  R mid cheek  Skin / nail biopsy Type of biopsy: tangential   Informed consent: discussed and consent obtained   Anesthesia: the lesion was anesthetized in a standard fashion   Anesthesia comment:  Area prepped with alcohol Anesthetic:  1% lidocaine w/ epinephrine 1-100,000 buffered w/ 8.4%  NaHCO3 Instrument used: flexible razor blade   Hemostasis achieved with: pressure and aluminum chloride   Outcome: patient tolerated procedure well   Post-procedure details: wound care instructions given   Post-procedure details comment:  Ointment and small bandage applied  Specimen 2 - Surgical pathology Differential Diagnosis: D48.5 r/o solar lentigo vs MM Check Margins: No  ACTINIC DAMAGE - chronic, secondary to cumulative UV radiation exposure/sun exposure over time - diffuse scaly erythematous macules with underlying dyspigmentation - Recommend daily broad spectrum sunscreen SPF 30+ to sun-exposed areas, reapply every 2 hours as needed.  - Recommend staying in the shade or wearing long sleeves, sun glasses (UVA+UVB protection) and wide brim hats (4-inch brim around the entire circumference of the hat). - Call for new or changing lesions.   Return in 1 year (on 08/01/2023) for TBSE.  Maylene Roes, CMA, am acting as scribe for Willeen Niece, MD .  Documentation: I have reviewed the above documentation for accuracy and completeness, and I agree with the above.  Willeen Niece, MD

## 2022-08-07 ENCOUNTER — Telehealth: Payer: Self-pay

## 2022-08-07 NOTE — Telephone Encounter (Signed)
-----   Message from Willeen Niece, MD sent at 08/07/2022  5:17 PM EDT ----- 1. Skin , right mid upper back NEUROFIBROMA 2. Skin , right mid cheek SOLAR LENTIGO  1. Benign growth of nerve "insulation" tissue  2. Benign sun freckle  - please call patient

## 2022-08-07 NOTE — Telephone Encounter (Signed)
Advised patient biopsies of the right mid upper back and right mid cheek were both benign and no further treatment needed.

## 2022-08-13 ENCOUNTER — Telehealth: Payer: Self-pay | Admitting: *Deleted

## 2022-08-13 NOTE — Telephone Encounter (Signed)
Orders need to go to [employer] [facility]

## 2022-08-13 NOTE — Telephone Encounter (Signed)
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 Please return her call when ready

## 2022-08-14 NOTE — Telephone Encounter (Signed)
Order has been faxed to [facility]

## 2022-11-01 ENCOUNTER — Emergency Department: Payer: Medicare Other

## 2022-11-01 ENCOUNTER — Emergency Department
Admission: EM | Admit: 2022-11-01 | Discharge: 2022-11-01 | Disposition: A | Discharge status: Home / Self Care | Payer: Medicare Other | Attending: Emergency Medicine | Admitting: Emergency Medicine

## 2022-11-01 ENCOUNTER — Encounter: Payer: Self-pay | Admitting: Emergency Medicine

## 2022-11-01 ENCOUNTER — Other Ambulatory Visit: Payer: Self-pay

## 2022-11-01 DIAGNOSIS — W010XXA Fall on same level from slipping, tripping and stumbling without subsequent striking against object, initial encounter: Secondary | ICD-10-CM | POA: Insufficient documentation

## 2022-11-01 DIAGNOSIS — S0001XA Abrasion of scalp, initial encounter: Secondary | ICD-10-CM | POA: Diagnosis not present

## 2022-11-01 DIAGNOSIS — S0990XA Unspecified injury of head, initial encounter: Secondary | ICD-10-CM

## 2022-11-01 DIAGNOSIS — M25511 Pain in right shoulder: Secondary | ICD-10-CM | POA: Diagnosis not present

## 2022-11-01 DIAGNOSIS — W19XXXA Unspecified fall, initial encounter: Secondary | ICD-10-CM

## 2022-11-01 NOTE — ED Triage Notes (Signed)
Patient ambulatory to triage with steady gait, without difficulty or distress noted; pt reports PTA fell trying to kill a bug and hit her rt shoulder and top of head; denies LOC

## 2022-11-01 NOTE — ED Provider Notes (Signed)
[facility] Provider Note    Event Date/Time   First MD Initiated Contact with Patient 11/01/22 [phone]     (approximate)   History   Fall   HPI  Linda Yang is a 82 y.o. female who presents to the ED for evaluation of Fall   I reviewed routine PCP visit from 1 month ago.  Aspirin 81.  No anticoagulation.  Patient presents with her husband for evaluation of an accidental mechanical fall.  She was chasing a cockroach trying to squished it when she got tripped up on her feet and fell forward and struck her right shoulder and head on the floor.  No syncope or other trauma.  Has been ambulatory since then but was concerned about a possible head bleed so she wanted to get checked out   Physical Exam   Triage Vital Signs: ED Triage Vitals  Encounter Vitals Group     BP 11/01/22 0336 (!) 151/77     Systolic BP Percentile --      Diastolic BP Percentile --      Pulse Rate 11/01/22 0336 71     Resp 11/01/22 0336 18     Temp 11/01/22 0336 97.9 F (36.6 C)     Temp Source 11/01/22 0336 Oral     SpO2 11/01/22 0336 96 %     Weight 11/01/22 0331 210 lb (95.3 kg)     Height 11/01/22 0331 5\' 2"  (1.575 m)     Head Circumference --      Peak Flow --      Pain Score 11/01/22 0331 3     Pain Loc --      Pain Education --      Exclude from Growth Chart --     Most recent vital signs: Vitals:   11/01/22 0336  BP: (!) 151/77  Pulse: 71  Resp: 18  Temp: 97.9 F (36.6 C)  SpO2: 96%    General: Awake, no distress.  CV:  Good peripheral perfusion.  Resp:  Normal effort.  Abd:  No distention.  MSK:  No deformity noted.  Neuro:  No focal deficits appreciated. Cranial nerves II through XII intact 5/5 strength and sensation in all 4 extremities Other:  Minimal area of flat redness, superficial abrasion, to the top of the scalp without laceration, hematoma, step-offs or any significant tenderness. Minimal diffuse tenderness throughout the right shoulder.  No  evidence of open injury.  Feels intact   ED Results / Procedures / Treatments   Labs (all labs ordered are listed, but only abnormal results are displayed) Labs Reviewed - No data to display  EKG   RADIOLOGY CT head interpreted by me without evidence of acute intracranial pathology CT cervical spine interpreted by me without evidence of fracture or dislocation Plain film of the right shoulder interpreted by me without evidence of fracture or dislocation  Official radiology report(s): DG Shoulder Right  Result Date: 11/01/2022 CLINICAL DATA:  Fall with right shoulder pain EXAM: RIGHT SHOULDER - 2 VIEW COMPARISON:  None Available. FINDINGS: There is no evidence of fracture or dislocation. Degenerative spurring at the [facility] joint. IMPRESSION: No acute finding. Electronically Signed   By: Tiburcio Pea M.D.   On: 11/01/2022 04:47   CT HEAD WO CONTRAST ( )  Result Date: 11/01/2022 CLINICAL DATA:  Fall with top of head injury. EXAM: CT HEAD WITHOUT CONTRAST CT CERVICAL SPINE WITHOUT CONTRAST TECHNIQUE: Multidetector CT imaging of the head and cervical spine was  performed following the standard protocol without intravenous contrast. Multiplanar CT image reconstructions of the cervical spine were also generated. RADIATION DOSE REDUCTION: This exam was performed according to the departmental dose-optimization program which includes automated exposure control, adjustment of the mA and/or kV according to patient size and/or use of iterative reconstruction technique. COMPARISON:  11/12/2020 FINDINGS: CT HEAD FINDINGS Brain: No evidence of acute infarction, hemorrhage, hydrocephalus, extra-axial collection or mass lesion/mass effect. Vascular: No hyperdense vessel or unexpected calcification. Skull: Normal. Negative for fracture or focal lesion. Sinuses/Orbits: No evidence of injury CT CERVICAL SPINE FINDINGS Alignment: Normal. Skull base and vertebrae: No acute fracture. No primary bone lesion or focal  pathologic process. Soft tissues and spinal canal: No prevertebral fluid or swelling. No visible canal hematoma. Disc levels:  Ordinary degenerative endplate and facet spurring. Upper chest: No visible injury IMPRESSION: No evidence of intracranial or cervical spine injury. Electronically Signed   By: Tiburcio Pea M.D.   On: 11/01/2022 04:28   CT Cervical Spine Wo Contrast  Result Date: 11/01/2022 CLINICAL DATA:  Fall with top of head injury. EXAM: CT HEAD WITHOUT CONTRAST CT CERVICAL SPINE WITHOUT CONTRAST TECHNIQUE: Multidetector CT imaging of the head and cervical spine was performed following the standard protocol without intravenous contrast. Multiplanar CT image reconstructions of the cervical spine were also generated. RADIATION DOSE REDUCTION: This exam was performed according to the departmental dose-optimization program which includes automated exposure control, adjustment of the mA and/or kV according to patient size and/or use of iterative reconstruction technique. COMPARISON:  11/12/2020 FINDINGS: CT HEAD FINDINGS Brain: No evidence of acute infarction, hemorrhage, hydrocephalus, extra-axial collection or mass lesion/mass effect. Vascular: No hyperdense vessel or unexpected calcification. Skull: Normal. Negative for fracture or focal lesion. Sinuses/Orbits: No evidence of injury CT CERVICAL SPINE FINDINGS Alignment: Normal. Skull base and vertebrae: No acute fracture. No primary bone lesion or focal pathologic process. Soft tissues and spinal canal: No prevertebral fluid or swelling. No visible canal hematoma. Disc levels:  Ordinary degenerative endplate and facet spurring. Upper chest: No visible injury IMPRESSION: No evidence of intracranial or cervical spine injury. Electronically Signed   By: Tiburcio Pea M.D.   On: 11/01/2022 04:28    PROCEDURES and INTERVENTIONS:  Procedures  Medications - No data to display   IMPRESSION / MDM / ASSESSMENT AND PLAN / ED COURSE  I reviewed the  triage vital signs and the nursing notes.  Differential diagnosis includes, but is not limited to, skull fracture, cervical fracture, ICH, fracture, dislocation  {Patient presents with symptoms of an acute illness or injury that is potentially life-threatening.  Patient presents for evaluation after a fall.  She looks well and is neurologically intact.  We will obtain imaging.  No indications for serum diagnostics  Clinical Course as of 11/01/22 0500  Thu Nov 01, 2022  0459 Reassessed and discussed reassuring imaging.  She is thankful and appreciative.  We discussed expectant management and return precautions [DS]    Clinical Course User Index [DS] Delton Prairie, MD     FINAL CLINICAL IMPRESSION(S) / ED DIAGNOSES   Final diagnoses:  Fall, initial encounter  Injury of head, initial encounter  Acute pain of right shoulder     Rx / DC Orders   ED Discharge Orders     None        Note:  This document was prepared using Dragon voice recognition software and may include unintentional dictation errors.   Delton Prairie, MD 11/01/22 0500

## 2022-11-01 NOTE — Discharge Instructions (Signed)
Use Tylenol for pain and fevers.  Up to 1000 mg per dose, up to 4 times per day.  Do not take more than 4000 mg of Tylenol/acetaminophen within 24 hours..  

## 2023-01-28 ENCOUNTER — Encounter: Payer: Self-pay | Admitting: Emergency Medicine

## 2023-01-28 ENCOUNTER — Other Ambulatory Visit: Payer: Self-pay

## 2023-01-28 ENCOUNTER — Emergency Department: Payer: Medicare Other

## 2023-01-28 ENCOUNTER — Emergency Department
Admission: EM | Admit: 2023-01-28 | Discharge: 2023-01-28 | Disposition: A | Discharge status: Home / Self Care | Payer: Medicare Other | Attending: Emergency Medicine | Admitting: Emergency Medicine

## 2023-01-28 DIAGNOSIS — R200 Anesthesia of skin: Secondary | ICD-10-CM | POA: Diagnosis not present

## 2023-01-28 DIAGNOSIS — I6523 Occlusion and stenosis of bilateral carotid arteries: Secondary | ICD-10-CM | POA: Insufficient documentation

## 2023-01-28 DIAGNOSIS — H538 Other visual disturbances: Secondary | ICD-10-CM | POA: Diagnosis not present

## 2023-01-28 DIAGNOSIS — Z853 Personal history of malignant neoplasm of breast: Secondary | ICD-10-CM | POA: Diagnosis not present

## 2023-01-28 DIAGNOSIS — R202 Paresthesia of skin: Secondary | ICD-10-CM | POA: Insufficient documentation

## 2023-01-28 DIAGNOSIS — G43809 Other migraine, not intractable, without status migrainosus: Secondary | ICD-10-CM | POA: Insufficient documentation

## 2023-01-28 DIAGNOSIS — E7800 Pure hypercholesterolemia, unspecified: Secondary | ICD-10-CM | POA: Insufficient documentation

## 2023-01-28 DIAGNOSIS — I6782 Cerebral ischemia: Secondary | ICD-10-CM | POA: Diagnosis not present

## 2023-01-28 DIAGNOSIS — I252 Old myocardial infarction: Secondary | ICD-10-CM | POA: Diagnosis not present

## 2023-01-28 DIAGNOSIS — I10 Essential (primary) hypertension: Secondary | ICD-10-CM | POA: Insufficient documentation

## 2023-01-28 LAB — COMPREHENSIVE METABOLIC PANEL
ALT: 16 U/L (ref 0–44)
AST: 18 U/L (ref 15–41)
Albumin: 3.7 g/dL (ref 3.5–5.0)
Alkaline Phosphatase: 43 U/L (ref 38–126)
Anion gap: 10 (ref 5–15)
BUN: 18 mg/dL (ref 8–23)
CO2: 29 mmol/L (ref 22–32)
Calcium: 8.6 mg/dL — ABNORMAL LOW (ref 8.9–10.3)
Chloride: 98 mmol/L (ref 98–111)
Creatinine, Ser: 0.52 mg/dL (ref 0.44–1.00)
GFR, Estimated: >60 mL/min (ref >60)
Glucose, Bld: 102 mg/dL — ABNORMAL HIGH (ref 70–99)
Potassium: 3.5 mmol/L (ref 3.5–5.1)
Sodium: 137 mmol/L (ref 135–145)
Total Bilirubin: 0.7 mg/dL (ref <1.2)
Total Protein: 6.5 g/dL (ref 6.5–8.1)

## 2023-01-28 LAB — ETHANOL: Alcohol, Ethyl (B): <10 mg/dL (ref <10)

## 2023-01-28 LAB — DIFFERENTIAL
Abs Immature Granulocytes: 0.02 10*3/uL (ref 0.00–0.07)
Basophils Absolute: 0 10*3/uL (ref 0.0–0.1)
Basophils Relative: 1 %
Eosinophils Absolute: 0.1 10*3/uL (ref 0.0–0.5)
Eosinophils Relative: 1 %
Immature Granulocytes: 0 %
Lymphocytes Relative: 23 %
Lymphs Abs: 2 10*3/uL (ref 0.7–4.0)
Monocytes Absolute: 1 10*3/uL (ref 0.1–1.0)
Monocytes Relative: 11 %
Neutro Abs: 5.6 10*3/uL (ref 1.7–7.7)
Neutrophils Relative %: 64 %

## 2023-01-28 LAB — CBG MONITORING, ED: Glucose-Capillary: 99 mg/dL (ref 70–99)

## 2023-01-28 LAB — CBC
HCT: 41.7 % (ref 36.0–46.0)
Hemoglobin: 14.1 g/dL (ref 12.0–15.0)
MCH: 31.7 pg (ref 26.0–34.0)
MCHC: 33.8 g/dL (ref 30.0–36.0)
MCV: 93.7 fL (ref 80.0–100.0)
Platelets: 292 10*3/uL (ref 150–400)
RBC: 4.45 MIL/uL (ref 3.87–5.11)
RDW: 13 % (ref 11.5–15.5)
WBC: 8.7 10*3/uL (ref 4.0–10.5)
nRBC: 0 % (ref 0.0–0.2)

## 2023-01-28 LAB — APTT: aPTT: 29 s (ref 24–36)

## 2023-01-28 LAB — PROTIME-INR
INR: 1 (ref 0.8–1.2)
Prothrombin Time: 13.6 s (ref 11.4–15.2)

## 2023-01-28 MED ORDER — LORAZEPAM 2 MG/ML IJ SOLN
1.0000 mg | Freq: Once | INTRAMUSCULAR | Status: AC | PRN
  Administered 2023-01-28: 1 mg via INTRAVENOUS
  Filled 2023-01-28: qty 1

## 2023-01-28 MED ORDER — KETOROLAC TROMETHAMINE 15 MG/ML IJ SOLN
15.0000 mg | Freq: Once | INTRAMUSCULAR | Status: DC
  Filled 2023-01-28: qty 1

## 2023-01-28 MED ORDER — SODIUM CHLORIDE 0.9% FLUSH
3.0000 mL | Freq: Once | INTRAVENOUS | Status: AC
  Administered 2023-01-28: 3 mL via INTRAVENOUS

## 2023-01-28 MED ORDER — MAGNESIUM SULFATE 2 GM/50ML IV SOLN
2.0000 g | Freq: Once | INTRAVENOUS | Status: AC
  Administered 2023-01-28: 2 g via INTRAVENOUS
  Filled 2023-01-28: qty 50

## 2023-01-28 MED ORDER — SODIUM CHLORIDE 0.9 % IV BOLUS
500.0000 mL | Freq: Once | INTRAVENOUS | Status: AC
  Administered 2023-01-28: 500 mL via INTRAVENOUS

## 2023-01-28 MED ORDER — ACETAMINOPHEN 325 MG PO TABS
650.0000 mg | ORAL_TABLET | Freq: Once | ORAL | Status: DC
  Filled 2023-01-28: qty 2

## 2023-01-28 MED ORDER — IOHEXOL 350 MG/ML SOLN
75.0000 mL | Freq: Once | INTRAVENOUS | Status: AC | PRN
  Administered 2023-01-28: 75 mL via INTRAVENOUS

## 2023-01-28 MED ORDER — PROCHLORPERAZINE EDISYLATE 10 MG/2ML IJ SOLN
10.0000 mg | Freq: Once | INTRAMUSCULAR | Status: DC
  Filled 2023-01-28: qty 2

## 2023-01-28 NOTE — ED Triage Notes (Signed)
Patient to ED via POV for left sided facial numbness. Pt reports blurred vision in left eye. LWK 1130. Denies weakness or slurred speech. Ambulatory to triage. States also having headache. Prior to this starting patient was having family stressors.

## 2023-01-28 NOTE — ED Notes (Signed)
Code stroke  called to  [facility]  at  12:40pm

## 2023-01-28 NOTE — Progress Notes (Signed)
   01/28/23 1600  Spiritual Encounters  Type of Visit Follow up  Care provided to: Pt and family  Referral source Patient request  Reason for visit Urgent spiritual support  OnCall Visit Yes  Spiritual Framework  Presenting Themes Meaning/purpose/sources of inspiration;Coping tools  Patient Stress Factors Family relationships  Family Stress Factors Family relationships  Interventions  Spiritual Care Interventions Made Established relationship of care and support;Compassionate presence;Reflective listening;Normalization of emotions;Reconciliation with self/others  Intervention Outcomes  Outcomes Connection to spiritual care;Awareness of support  Spiritual Care Plan  Spiritual Care Issues Still Outstanding No further spiritual care needs at this time (see row info)   Patient asked to speak with me dealing with a family situation. Give the patient my view and educate her how stress is not good for her at this time. Patient is now looking for ways to stay stress free and out of the situation. Patient has support of her husband, family, and church community. Patient is discharging and going home with a new outlook on the family situation.

## 2023-01-28 NOTE — Progress Notes (Signed)
Code stroke activated per elert @1243 .  Pt in CT at time of activation.  Reports that she was at bible study and became emotional discussing life situations and developed left facial sensation changes and left eye vision changes.  LKWT 1130 per patient.  NO blood thinners. Paged Dr Wilford Corner @1245 , present in CT to assess patient @ 1246, no TNK administered, additional imaging ordered.  TSRN off camera @ 1255.  MRS 0  Avner Stroder, Multimedia programmer

## 2023-01-28 NOTE — Consult Note (Signed)
NEUROLOGY CONSULT NOTE   Date of service: January 28, 2023 Patient Name: Linda Yang MRN:  [id number] DOB:  02-22-1940 Chief Complaint: "left sided numbness" Requesting Provider: Pilar Jarvis, MD  History of Present Illness  Linda Yang is a 82 y.o. female  has a past medical history of Allergies, Anginal pain (HCC), Anxiety, Arthritis, Asthma, Breast cancer (HCC) (02/2017), Complication of anesthesia, Coronary artery disease, Dyspnea, Family history of breast cancer, Family history of kidney cancer, Family history of leukemia, Family history of lymphoma, Fatty liver, GERD (gastroesophageal reflux disease), Glaucoma, Headache, High cholesterol, Hypertension, Macular degeneration, Myocardial infarction (HCC), Personal history of radiation therapy, PONV (postoperative nausea and vomiting), and Sleep apnea.   Presented to the emergency department via private vehicle for sudden onset of left-sided facial numbness and some blurred vision in the left eye.  Her last known well is around 11:30 AM.  She said that she was in an argument with someone and suddenly started noticing left-sided facial numbness and some blurred vision.  She also had a mild headache going across her forehead.  She has had migraines in the past without aura but not had any sensory or motor symptoms associated with it. She reports her symptoms are somewhat better but still present at this time. Code stroke was activated because she was within the window. She was evaluated in the CT scanner emergently.    LKW: 11:30 AM Modified rankin score: 0-Completely asymptomatic and back to baseline post- stroke IV Thrombolysis: Symptoms too mild to treat EVT: No LVO   NIHSS components Score: Comment  1a Level of Conscious 0[x]  1[]  2[]  3[]      1b LOC Questions 0[x]  1[]  2[]       1c LOC Commands 0[x]  1[]  2[]       2 Best Gaze 0[x]  1[]  2[]       3 Visual 0[x]  1[]  2[]  3[]      4 Facial Palsy 0[x]  1[]  2[]  3[]      5a Motor Arm - left 0[x]  1[]   2[]  3[]  4[]  UN[]    5b Motor Arm - Right 0[x]  1[]  2[]  3[]  4[]  UN[]    6a Motor Leg - Left 0[x]  1[]  2[]  3[]  4[]  UN[]    6b Motor Leg - Right 0[x]  1[]  2[]  3[]  4[]  UN[]    7 Limb Ataxia 0[x]  1[]  2[]  3[]  UN[]     8 Sensory 0[]  1[x]  2[]  UN[]      9 Best Language 0[x]  1[]  2[]  3[]      10 Dysarthria 0[x]  1[]  2[]  UN[]      11 Extinct. and Inattention 0[x]  1[]  2[]       TOTAL: 1      ROS  Comprehensive ROS performed and pertinent positives documented in HPI    Past History   Past Medical History:  Diagnosis Date   Allergies    Anginal pain (HCC)    occas. took ntg last week   Anxiety    Arthritis    Asthma    Breast cancer (HCC) 02/2017   right breast   Complication of anesthesia    Coronary artery disease    Dyspnea    doe   Family history of breast cancer    Family history of kidney cancer    Family history of leukemia    Family history of lymphoma    Fatty liver    GERD (gastroesophageal reflux disease)    Glaucoma    Headache    High cholesterol    Hypertension    Macular degeneration    Myocardial  infarction [facility])    5/18   Personal history of radiation therapy    PONV (postoperative nausea and vomiting)    Sleep apnea    no CPAP    Past Surgical History:  Procedure Laterality Date   ABDOMINAL HYSTERECTOMY     ACHILLES TENDON SURGERY     BREAST BIOPSY Right 03/05/2017   Affirm Bx- invasive mammary   BREAST LUMPECTOMY Right 03/25/2017   invasive mammary, clear margins, LN negative   BUNIONECTOMY Bilateral    CATARACT EXTRACTION W/PHACO Left 01/29/2017   Procedure: CATARACT EXTRACTION PHACO AND INTRAOCULAR LENS PLACEMENT (IOC);  Surgeon: Galen Manila, MD;  Location: [facility] ORS;  Service: Ophthalmology;  Laterality: Left;  [place] 00:49.9 AP% 20.3 CDE 10.11 Fluid Pack lot # [id number]   CATARACT EXTRACTION W/PHACO Right 08/30/2020   Procedure: CATARACT EXTRACTION PHACO AND INTRAOCULAR LENS PLACEMENT (IOC) RIGHT 11.15 01:09.0;  Surgeon: Galen Manila, MD;  Location:  [facility];  Service: Ophthalmology;  Laterality: Right;  DEXTENZA   COLONOSCOPY WITH PROPOFOL N/A 08/08/2021   Procedure: COLONOSCOPY WITH PROPOFOL;  Surgeon: Regis Bill, MD;  Location: [facility];  Service: Endoscopy;  Laterality: N/A;   CORONARY ANGIOPLASTY     STENT 5/18   CORONARY PRESSURE/FFR STUDY N/A 07/18/2016   Procedure: Intravascular Pressure Wire/FFR Study;  Surgeon: Alwyn Pea, MD;  Location: [facility];  Service: Cardiovascular;  Laterality: N/A;   CORONARY STENT INTERVENTION N/A 07/18/2016   Procedure: Coronary Stent Intervention;  Surgeon: Alwyn Pea, MD;  Location: [facility];  Service: Cardiovascular;  Laterality: N/A;   CTR     ESOPHAGOGASTRODUODENOSCOPY (EGD) WITH PROPOFOL N/A 08/08/2021   Procedure: ESOPHAGOGASTRODUODENOSCOPY (EGD) WITH PROPOFOL;  Surgeon: Regis Bill, MD;  Location: [facility];  Service: Endoscopy;  Laterality: N/A;   LEFT HEART CATH AND CORONARY ANGIOGRAPHY N/A 07/18/2016   Procedure: Left Heart Cath and Coronary Angiography;  Surgeon: Alwyn Pea, MD;  Location: [facility];  Service: Cardiovascular;  Laterality: N/A;   MASTECTOMY Right    partial   PARTIAL MASTECTOMY WITH NEEDLE LOCALIZATION Right 03/25/2017   Procedure: PARTIAL MASTECTOMY WITH NEEDLE LOCALIZATION;  Surgeon: Carolan Shiver, MD;  Location: [facility];  Service: General;  Laterality: Right;   SENTINEL NODE BIOPSY Right 03/25/2017   Procedure: SENTINEL NODE BIOPSY;  Surgeon: Carolan Shiver, MD;  Location: [facility];  Service: General;  Laterality: Right;   TRIGGER FINGER RELEASE Bilateral     Family History: Family History  Problem Relation Age of Onset   Breast cancer Paternal Aunt        post men.    Breast cancer Cousin    Lymphoma Father    Leukemia Maternal Aunt    Cervical cancer Paternal Grandmother        d. 86s   Breast cancer Cousin        dx 9   Cervical cancer Cousin     Kidney cancer Cousin     Social History  reports that she has never smoked. She has never used smokeless tobacco. She reports that she does not drink alcohol and does not use drugs.  Allergies  Allergen Reactions   Atorvastatin Other (See Comments)    Leg cramps.   Brilinta [Ticagrelor]    Erythromycin Diarrhea   Metoprolol Other (See Comments)    Hypotension    Other Diarrhea and Other (See Comments)    All mycin antibiotics    Red Yeast Rice [Cholestin] Other (See Comments)  GI discomfort    Statins Other (See Comments)    Myalgia, cramps    Sulfamethoxazole-Trimethoprim Other (See Comments)    Medications   Current Facility-Administered Medications:    acetaminophen (TYLENOL) tablet 650 mg, 650 mg, Oral, Once, Pilar Jarvis, MD   ketorolac (TORADOL) 15 MG/ML injection 15 mg, 15 mg, Intravenous, Once, Pilar Jarvis, MD   LORazepam (ATIVAN) injection 1 mg, 1 mg, Intravenous, Once PRN, Pilar Jarvis, MD   magnesium sulfate IVPB 2 g 50 mL, 2 g, Intravenous, Once, Pilar Jarvis, MD   prochlorperazine (COMPAZINE) injection 10 mg, 10 mg, Intravenous, Once, Pilar Jarvis, MD   sodium chloride 0.9 % bolus 500 mL, 500 mL, Intravenous, Once, Pilar Jarvis, MD   sodium chloride flush (NS) 0.9 % injection 3 mL, 3 mL, Intravenous, Once, Pilar Jarvis, MD  Current Outpatient Medications:    albuterol (PROVENTIL) (2.5 MG/3ML) 0.083% nebulizer solution, Inhale into the lungs., Disp: , Rfl:    anastrozole (ARIMIDEX) 1 MG tablet, Take 1 tablet (1 mg total) by mouth daily. (Patient not taking: Reported on 06/05/2022), Disp: 90 tablet, Rfl: 0   aspirin EC 81 MG tablet, Take 81 mg by mouth daily. , Disp: , Rfl:    benzonatate (TESSALON) 100 MG capsule, Take 2 capsules (200 mg total) by mouth every 8 (eight) hours. (Patient not taking: Reported on 03/08/2022), Disp: 21 capsule, Rfl: 0   budesonide (PULMICORT) 0.25 MG/2ML nebulizer solution, Take 0.25 mg by nebulization 2 (two) times daily as needed., Disp: ,  Rfl:    busPIRone (BUSPAR) 5 MG tablet, Take 5 mg by mouth 2 (two) times daily., Disp: , Rfl:    Calcium Carbonate-Simethicone 750-80 MG CHEW, Chew 1 tablet by mouth daily as needed (heartburn)., Disp: , Rfl:    Calcium Carbonate-Vitamin D 600-400 MG-UNIT tablet, Take 2 tablets by mouth daily., Disp: , Rfl:    Camphor-Menthol-Capsicum 80-24-16 MG PTCH, Place 1-3 patches onto the skin daily as needed (pain). , Disp: , Rfl:    cholecalciferol (VITAMIN D) 400 units TABS tablet, Take 400 Units by mouth at bedtime., Disp: , Rfl:    docusate sodium (COLACE) 100 MG capsule, Take 100 mg by mouth daily as needed (for constipation). (Patient not taking: Reported on 06/05/2022), Disp: , Rfl:    Evolocumab (REPATHA SURECLICK) 140 MG/ML SOAJ, Inject 140 mg into the skin every 14 (fourteen) days., Disp: 2 mL, Rfl: 8   ipratropium (ATROVENT) 0.06 % nasal spray, Place 2 sprays into both nostrils 4 (four) times daily., Disp: 15 mL, Rfl: 12   METAMUCIL FIBER PO, Take by mouth as needed., Disp: , Rfl:    metoprolol succinate (TOPROL-XL) 25 MG 24 hr tablet, Take one pill once a day, Disp: 90 tablet, Rfl: 3   Multiple Vitamins-Minerals (ICAPS AREDS 2 PO), Take by mouth 2 (two) times daily., Disp: , Rfl:    nitrofurantoin, macrocrystal-monohydrate, (MACROBID) 100 MG capsule, Take 1 capsule (100 mg total) by mouth 2 (two) times daily., Disp: 10 capsule, Rfl: 0   nitroGLYCERIN (NITROSTAT) 0.4 MG SL tablet, Place 0.4 mg under the tongue every 5 (five) minutes as needed for chest pain. , Disp: , Rfl:    PARoxetine (PAXIL) 40 MG tablet, Take 40 mg by mouth at bedtime., Disp: , Rfl:    phenazopyridine (PYRIDIUM) 200 MG tablet, Take 1 tablet (200 mg total) by mouth 3 (three) times daily., Disp: 6 tablet, Rfl: 0   Polyvinyl Alcohol-Povidone PF 1.4-0.6 % SOLN, Place 1-2 drops into both eyes 3 (three) times  daily as needed (for dry eyes.)., Disp: , Rfl:    promethazine-dextromethorphan (PROMETHAZINE-DM) 6.25-15 MG/5ML syrup, Take 5  mLs by mouth 4 (four) times daily as needed., Disp: 118 mL, Rfl: 0   traMADol (ULTRAM) 50 MG tablet, Take 50 mg by mouth every 6 (six) hours as needed., Disp: , Rfl:    trolamine salicylate (ASPERCREME) 10 % cream, Apply 1 application topically 4 (four) times daily as needed for muscle pain., Disp: , Rfl:    UNABLE TO FIND, CBD Oil, Disp: , Rfl:    vitamin B-12 (CYANOCOBALAMIN) 1000 MCG tablet, Take 1,000 mcg by mouth at bedtime., Disp: , Rfl:   Vitals   Vitals:   26-Feb-2023 1237 2023/02/26 1238 02/26/2023 1311 February 26, 2023 1312  BP: 138/70  (!) 144/52   Pulse: 72     Resp: 18   15  Temp: 98.4 F (36.9 C)     TempSrc: Oral     SpO2: 93%     Weight:  94.8 kg    Height:  5\' 2"  (1.575 m)      Body mass index is 38.23 kg/m.  Physical Exam   Constitutional: Appears well-developed and well-nourished.  Psych: Affect appropriate to situation.  Eyes: No scleral injection.  HENT: No OP obstruction.  Head: Normocephalic.  Atraumatic Cardiovascular: Normal rate and regular rhythm.  Respiratory: Effort normal, non-labored breathing.  GI: Soft.  No distension. There is no tenderness.  Skin: WDI.   Neurologic Examination  Awake alert oriented x 3 No dysarthria No evidence of aphasia Cranial nerves: Pupils equal round reactive to light, extract movements intact, visual fields full, face grossly symmetric, facial sensation diminished on the left cheek in comparison to the right, sensation intact all over the forehead, tongue and palate midline. Motor examination with no drift in any of the 4 extremities Sensation: Sensory exam reveals diminished light touch on left leg in comparison to the right but intact on the arms and hands. Coordination examination reveals no dysmetria. Gait was normal  Labs/Imaging/Neurodiagnostic studies   CBC:  Recent Labs  Lab 02/26/2023 1241  WBC 8.7  NEUTROABS 5.6  HGB 14.1  HCT 41.7  MCV 93.7  PLT 292   Basic Metabolic Panel:  Lab Results  Component Value  Date   NA 137 26-Feb-2023   K 3.5 02-26-2023   CO2 29 02-26-23   GLUCOSE 102 (H) Feb 26, 2023   BUN 18 02/26/23   CREATININE 0.52 26-Feb-2023   CALCIUM 8.6 (L) 2023/02/26   GFRNONAA >60 02-26-2023   GFRAA >60 01/20/2017   Urine Drug Screen: Pending  Alcohol Level     Component Value Date/Time   ETH <10 2023/02/26 1241   INR  Lab Results  Component Value Date   INR 1.0 2023-02-26   APTT  Lab Results  Component Value Date   APTT 29 02/26/2023   CT Head without contrast(Personally reviewed): No acute findings  CT angio Head and Neck with contrast(Personally reviewed): No emergent large vessel occlusion    ASSESSMENT   Linda Yang is a 82 y.o. female  has a past medical history of Allergies, Anginal pain (HCC), Anxiety, Arthritis, Asthma, Breast cancer (HCC) (02/2017), Complication of anesthesia, Coronary artery disease, Dyspnea, Family history of breast cancer, Family history of kidney cancer, Family history of leukemia, Family history of lymphoma, Fatty liver, GERD (gastroesophageal reflux disease), Glaucoma, Headache, High cholesterol, Hypertension, Macular degeneration, Myocardial infarction [facility]), Personal history of radiation therapy, PONV (postoperative nausea and vomiting), and Sleep apnea.  Presenting with sudden  onset of left-sided numbness in the face and leg and some blurred vision in the setting of headache.  Symptoms started when she was in a heated discussion/verbal argument with someone. She has mild symptoms of subjective sensory deficit on the left. Detailed discussion on use of IV TNKase held with the patient and upon weighing the risks and benefits, she decided to not consider TNKase. We will get an MRI to evaluate for stroke but currently appears more consistent with complex migraine versus paresthesias rather than stroke but given her risk factors including hyperlipidemia for which she is on Repatha, high blood pressure and prior history of malignancies, a  small cortical or thalamic infarction causing sensory only symptoms is also possible.  RECOMMENDATIONS  Stat MRI of the brain-further recommendations after MRI of the brain.   Addendum MRI of the brain completed and reviewed.  On my review, no restricted diffusion on axial or coronal DWI images. I would recommend treatment with migraine cocktail and if she is symptomatically better, discharged home with outpatient follow-up as needed. Plan was discussed with Dr. Modesto Charon ______________________________________________________________________    Signed, Milon Dikes, MD [facility]

## 2023-01-28 NOTE — Progress Notes (Signed)
   01/28/23 1300  Spiritual Encounters  Type of Visit Attempt (pt unavailable)  Care provided to: Significant other  Referral source Code page  Reason for visit Urgent spiritual support  OnCall Visit Yes  Spiritual Framework  Patient Stress Factors Not reviewed  Family Stress Factors Not reviewed  Interventions  Spiritual Care Interventions Made Established relationship of care and support  Intervention Outcomes  Outcomes Awareness of support  Spiritual Care Plan  Spiritual Care Issues Still Outstanding Chaplain will continue to follow   Patient was still being Scan when I spoke with her spoke in the family Consult room. We revisit patient room later after medical staff complete their assessment of the patient. Let spoke know that we are here for them during this time.

## 2023-01-28 NOTE — ED Notes (Signed)
Charge RN called for code stroke bed

## 2023-01-28 NOTE — Discharge Instructions (Addendum)
 Thank you for choosing [place] for your health care today!  Please see your primary doctor this week for a follow up appointment.   If you have any new, worsening, or unexpected symptoms call your doctor right away or come back to the emergency department for reevaluation.  It was my pleasure to care for you today.   Daneil Dan Modesto Charon, MD

## 2023-01-28 NOTE — ED Notes (Signed)
Pt back to MRI ?

## 2023-01-28 NOTE — Progress Notes (Signed)
CODE STROKE- PHARMACY COMMUNICATION   Time CODE STROKE called/page received: 1245  Time response to CODE STROKE was made (in person or via phone): 1248  Time Stroke Kit retrieved from [employer] (only if needed): No thrombolytic per joint decision between neurologist and patient  Name of Provider/Nurse contacted: Dr. Wilford Corner  Past Medical History:  Diagnosis Date   Allergies    Anginal pain (HCC)    occas. took ntg last week   Anxiety    Arthritis    Asthma    Breast cancer (HCC) 02/2017   right breast   Complication of anesthesia    Coronary artery disease    Dyspnea    doe   Family history of breast cancer    Family history of kidney cancer    Family history of leukemia    Family history of lymphoma    Fatty liver    GERD (gastroesophageal reflux disease)    Glaucoma    Headache    High cholesterol    Hypertension    Macular degeneration    Myocardial infarction [facility])    5/18   Personal history of radiation therapy    PONV (postoperative nausea and vomiting)    Sleep apnea    no CPAP   Prior to Admission medications   Medication Sig Start Date End Date Taking? Authorizing Provider  albuterol (PROVENTIL) (2.5 MG/3ML) 0.083% nebulizer solution Inhale into the lungs. 04/07/21 06/05/22  [provider]  anastrozole (ARIMIDEX) 1 MG tablet Take 1 tablet (1 mg total) by mouth daily. Patient not taking: Reported on 06/05/2022 01/15/22   Jeralyn Ruths, MD  aspirin EC 81 MG tablet Take 81 mg by mouth daily.     [provider]  benzonatate (TESSALON) 100 MG capsule Take 2 capsules (200 mg total) by mouth every 8 (eight) hours. Patient not taking: Reported on 03/08/2022 05/26/21   Becky Augusta, NP  budesonide (PULMICORT) 0.25 MG/2ML nebulizer solution Take 0.25 mg by nebulization 2 (two) times daily as needed.    [provider]  busPIRone (BUSPAR) 5 MG tablet Take 5 mg by mouth 2 (two) times daily.    [provider]  Calcium  Carbonate-Simethicone 750-80 MG CHEW Chew 1 tablet by mouth daily as needed (heartburn).    [provider]  Calcium Carbonate-Vitamin D 600-400 MG-UNIT tablet Take 2 tablets by mouth daily.    [provider]  Camphor-Menthol-Capsicum 80-24-16 MG PTCH Place 1-3 patches onto the skin daily as needed (pain).     [provider]  cholecalciferol (VITAMIN D) 400 units TABS tablet Take 400 Units by mouth at bedtime.    [provider]  docusate sodium (COLACE) 100 MG capsule Take 100 mg by mouth daily as needed (for constipation). Patient not taking: Reported on 06/05/2022    [provider]  Evolocumab (REPATHA SURECLICK) 140 MG/ML SOAJ Inject 140 mg into the skin every 14 (fourteen) days. 07/26/22   Antonieta Iba, MD  ipratropium (ATROVENT) 0.06 % nasal spray Place 2 sprays into both nostrils 4 (four) times daily. 05/26/21   Becky Augusta, NP  METAMUCIL FIBER PO Take by mouth as needed.    [provider]  metoprolol succinate (TOPROL-XL) 25 MG 24 hr tablet Take one pill once a day 06/05/22   Antonieta Iba, MD  Multiple Vitamins-Minerals (ICAPS AREDS 2 PO) Take by mouth 2 (two) times daily.    [provider]  nitrofurantoin, macrocrystal-monohydrate, (MACROBID) 100 MG capsule Take 1 capsule (100  mg total) by mouth 2 (two) times daily. 08/05/21   Becky Augusta, NP  nitroGLYCERIN (NITROSTAT) 0.4 MG SL tablet Place 0.4 mg under the tongue every 5 (five) minutes as needed for chest pain.  07/06/16   [provider]  PARoxetine (PAXIL) 40 MG tablet Take 40 mg by mouth at bedtime.    [provider]  phenazopyridine (PYRIDIUM) 200 MG tablet Take 1 tablet (200 mg total) by mouth 3 (three) times daily. 08/05/21   Becky Augusta, NP  Polyvinyl Alcohol-Povidone PF 1.4-0.6 % SOLN Place 1-2 drops into both eyes 3 (three) times daily as needed (for dry eyes.).    [provider]  promethazine-dextromethorphan (PROMETHAZINE-DM)  6.25-15 MG/5ML syrup Take 5 mLs by mouth 4 (four) times daily as needed. 05/26/21   Becky Augusta, NP  traMADol (ULTRAM) 50 MG tablet Take 50 mg by mouth every 6 (six) hours as needed. 07/24/22   [provider]  trolamine salicylate (ASPERCREME) 10 % cream Apply 1 application topically 4 (four) times daily as needed for muscle pain.    [provider]  UNABLE TO FIND CBD Oil    [provider]  vitamin B-12 (CYANOCOBALAMIN) 1000 MCG tablet Take 1,000 mcg by mouth at bedtime.    [provider]    Will M. Dareen Piano, PharmD Clinical Pharmacist 01/28/2023 12:57 PM

## 2023-01-28 NOTE — ED Notes (Signed)
Secretary Misty Stanley called and given all info for code stroke pt.

## 2023-01-28 NOTE — ED Provider Notes (Addendum)
[facility] Provider Note    Event Date/Time   First MD Initiated Contact with Patient 01/28/23 1247     (approximate)   History   Numbness   HPI  Linda Yang is a 82 y.o. female   Past medical history of breast cancer, high cholesterol, hypertension, prior MI who comes to the emergency department with a stroke code.  She was at church and had a emotional stressor as there was a family dispute and then she began to have a headache as well as numbness to the left side of her face and left leg.  This happened around noon time.  She came immediately to the emergency department.  She had otherwise been in her regular state of health, denies any trauma, denies any recent medical illnesses and has no other acute medical complaints.  She specifically denies chest pain  Independent Historian contributed to assessment above: Husband at bedside to corroborate information past medical history as above       Physical Exam   Triage Vital Signs: ED Triage Vitals  Encounter Vitals Group     BP 01/28/23 1237 138/70     Systolic BP Percentile --      Diastolic BP Percentile --      Pulse Rate 01/28/23 1237 72     Resp 01/28/23 1237 18     Temp 01/28/23 1237 98.4 F (36.9 C)     Temp Source 01/28/23 1237 Oral     SpO2 01/28/23 1237 93 %     Weight 01/28/23 1238 209 lb (94.8 kg)     Height 01/28/23 1238 5\' 2"  (1.575 m)     Head Circumference --      Peak Flow --      Pain Score 01/28/23 1238 3     Pain Loc --      Pain Education --      Exclude from Growth Chart --     Most recent vital signs: Vitals:   01/28/23 1311 01/28/23 1312  BP: (!) 144/52   Pulse:    Resp:  15  Temp:    SpO2:      General: Awake, no distress.  CV:  Good peripheral perfusion.  Resp:  Normal effort.  Abd:  No distention.  Other:  Pleasant woman in no acute distress with normal vital signs albeit slightly hypertensive.  She is able to range all extremities full range of  motion has no facial asymmetry but there is subjective numbness to palpation of the left side of her face and the left lower extremity, this spares the upper extremities however.  She is a soft benign abdominal exam.  She has radial pulses intact bilaterally   ED Results / Procedures / Treatments   Labs (all labs ordered are listed, but only abnormal results are displayed) Labs Reviewed  COMPREHENSIVE METABOLIC PANEL - Abnormal; Notable for the following components:      Result Value   Glucose, Bld 102 (*)    Calcium 8.6 (*)    All other components within normal limits  PROTIME-INR  APTT  CBC  DIFFERENTIAL  ETHANOL  CBG MONITORING, ED  I-STAT CREATININE, ED  CBG MONITORING, ED     I ordered and reviewed the above labs they are notable for glucose is normal  EKG  ED ECG REPORT I, Pilar Jarvis, the attending physician, personally viewed and interpreted this ECG.   Date: 01/28/2023  EKG Time: 1313  Rate: 72  Rhythm:  sinus  Axis: nl  Intervals:none  ST&T Change: no stemi    RADIOLOGY I independently reviewed and interpreted CT scan of the head and see no obvious bleeding or midline shift I also reviewed radiologist's formal read.   PROCEDURES:  Critical Care performed: Yes, see critical care procedure note(s)  .Critical Care  Performed by: Pilar Jarvis, MD Authorized by: Pilar Jarvis, MD   Critical care provider statement:    Critical care time (minutes):  30   Critical care was time spent personally by me on the following activities:  Development of treatment plan with patient or surrogate, discussions with consultants, evaluation of patient's response to treatment, examination of patient, ordering and review of laboratory studies, ordering and review of radiographic studies, ordering and performing treatments and interventions, pulse oximetry, re-evaluation of patient's condition and review of old charts    MEDICATIONS ORDERED IN ED: Medications  ketorolac  (TORADOL) 15 MG/ML injection 15 mg (0 mg Intravenous Hold 01/28/23 1400)  prochlorperazine (COMPAZINE) injection 10 mg (0 mg Intravenous Hold 01/28/23 1400)  acetaminophen (TYLENOL) tablet 650 mg (0 mg Oral Hold 01/28/23 1359)  sodium chloride flush (NS) 0.9 % injection 3 mL (3 mLs Intravenous Given 01/28/23 1400)  iohexol (OMNIPAQUE) 350 MG/ML injection 75 mL (75 mLs Intravenous Contrast Given 01/28/23 1257)  sodium chloride 0.9 % bolus 500 mL (500 mLs Intravenous New Bag/Given 01/28/23 1356)  magnesium sulfate IVPB 2 g 50 mL (2 g Intravenous New Bag/Given 01/28/23 1358)  LORazepam (ATIVAN) injection 1 mg (1 mg Intravenous Given 01/28/23 1403)    External physician / consultants:  I spoke with Wilford Corner of neuro regarding care plan for this patient.   IMPRESSION / MDM / ASSESSMENT AND PLAN / ED COURSE  I reviewed the triage vital signs and the nursing notes.                                Patient's presentation is most consistent with acute presentation with potential threat to life or bodily function.  Differential diagnosis includes, but is not limited to stroke, complex migraine, head bleed   The patient is on the cardiac monitor to evaluate for evidence of arrhythmia and/or significant heart rate changes.  MDM:    Symptoms concerning for stroke with acute onset within the thrombolytic window.  Fortunately imaging looks normal and symptoms are very mild at this time.  Defer on thrombolytics given mild symptoms per neurology recommendations.  Pending MRI of the head.  Could be complex migraine we will treat with migraine cocktail.  No chest pain, unusual distribution of neurologic symptoms does not represent dissection.  Disposition will depend on MRI, repeat neurologic assessments, neurology recommendations.   -- Patient headache is resolved.  Her numbness has improved greatly without intervention.  She is declining migraine cocktail states she feels better.  MRI was reviewed Dr Wilford Corner  which is no acute abnormalities.  Pending formal read by radiology but as I informed this patient of the findings, she is eager to go home at this time prior to final read.  She feels well.  She will follow-up with PMD        FINAL CLINICAL IMPRESSION(S) / ED DIAGNOSES   Final diagnoses:  Paresthesia  Other migraine without status migrainosus, not intractable     Rx / DC Orders   ED Discharge Orders     None        Note:  This document  was prepared using Conservation officer, historic buildings and may include unintentional dictation errors.    Pilar Jarvis, MD 01/28/23 1407    Pilar Jarvis, MD 01/28/23 [phone]

## 2023-01-28 NOTE — ED Notes (Signed)
CT called and proved with code stroke pt and needing room. Pt to be taken to CT 2  Husband taken to family wait until room is ready.

## 2023-03-28 ENCOUNTER — Telehealth: Payer: Self-pay | Admitting: Cardiovascular Disease

## 2023-03-28 NOTE — Telephone Encounter (Signed)
 Pt c/o medication issue:  1. Name of Medication:   Evolocumab  (REPATHA  SURECLICK) 140 MG/ML SOAJ    2. How are you currently taking this medication (dosage and times per day)?   3. Are you having a reaction (difficulty breathing--STAT)? no  4. What is your medication issue? Patient states that the prices of medication has went up. And she can no longer afford it. Please advise

## 2023-03-29 ENCOUNTER — Other Ambulatory Visit: Payer: Self-pay | Admitting: Family Medicine

## 2023-03-29 ENCOUNTER — Telehealth: Payer: Self-pay

## 2023-03-29 ENCOUNTER — Other Ambulatory Visit (HOSPITAL_COMMUNITY): Payer: Self-pay

## 2023-03-29 DIAGNOSIS — Z1231 Encounter for screening mammogram for malignant neoplasm of breast: Secondary | ICD-10-CM

## 2023-03-29 NOTE — Telephone Encounter (Signed)
 She has an active PA still. Test claim shows refill too soon so claim recently paid. She has [employer]ARP so it's likely high due to her deductible. I can enroll patient in [employer].

## 2023-03-29 NOTE — Telephone Encounter (Signed)
 Patient Advocate Encounter   The patient was approved for a [employer] grant that will help cover the cost of REPATHA  Total amount awarded, $2,500.  Effective: 02/27/23 - 02/26/24   APW:[id number] ERW:[id number] Hmnle:[id number] PI:[id number]   Pharmacy provided with approval and processing information. Patient informed via [facility] Ileana Lehmann, CPhT  Pharmacy Patient Advocate Specialist  Direct Number: [phone] Fax: [phone]

## 2023-04-03 ENCOUNTER — Ambulatory Visit
Admission: RE | Admit: 2023-04-03 | Discharge: 2023-04-03 | Disposition: A | Discharge status: Home / Self Care | Payer: Medicare Other | Source: Ambulatory Visit | Attending: Family Medicine | Admitting: Family Medicine

## 2023-04-03 DIAGNOSIS — Z1231 Encounter for screening mammogram for malignant neoplasm of breast: Secondary | ICD-10-CM | POA: Insufficient documentation

## 2023-04-09 NOTE — Telephone Encounter (Signed)
Patient is following up. She's thinking she will still have to pay $540 copay for Repatha and would like to discuss other options. Please clarify.

## 2023-04-11 NOTE — Telephone Encounter (Signed)
Patient is following up. She would like to further discuss [employer] grant for Repaha if possible.

## 2023-08-14 ENCOUNTER — Other Ambulatory Visit: Payer: Self-pay | Admitting: Cardiovascular Disease

## 2023-08-20 ENCOUNTER — Encounter: Payer: Medicare Other | Admitting: Dermatology

## 2023-08-27 ENCOUNTER — Other Ambulatory Visit: Payer: Self-pay | Admitting: Cardiovascular Disease

## 2023-08-27 NOTE — Telephone Encounter (Signed)
 Pt scheduled on 08/28/23

## 2023-08-28 ENCOUNTER — Ambulatory Visit: Attending: Cardiology | Admitting: Cardiology

## 2023-08-28 ENCOUNTER — Encounter: Payer: Self-pay | Admitting: Cardiology

## 2023-08-28 VITALS — BP 134/78 | HR 73 | Ht 62.0 in | Wt 216.8 lb

## 2023-08-28 DIAGNOSIS — E782 Mixed hyperlipidemia: Secondary | ICD-10-CM | POA: Diagnosis not present

## 2023-08-28 DIAGNOSIS — G4733 Obstructive sleep apnea (adult) (pediatric): Secondary | ICD-10-CM

## 2023-08-28 DIAGNOSIS — I10 Essential (primary) hypertension: Secondary | ICD-10-CM | POA: Diagnosis not present

## 2023-08-28 DIAGNOSIS — I25118 Atherosclerotic heart disease of native coronary artery with other forms of angina pectoris: Secondary | ICD-10-CM | POA: Diagnosis not present

## 2023-08-28 MED ORDER — REPATHA SURECLICK 140 MG/ML ~~LOC~~ SOAJ: 140.0000 mg | SUBCUTANEOUS | 3 refills | Status: DC

## 2023-08-28 NOTE — Progress Notes (Signed)
 Cardiology Office Note   Date:  08/28/2023  ID:  AUDRIS SPEAKER, DOB 08/28/1940, MRN [id number] PCP: Alla Amis, MD  [facility] Providers Cardiologist:  Evalene Lunger, MD Cardiology APP:  Gerard Frederick, NP     History of Present Illness Linda Yang is a 83 y.o. female with a past medical history of coronary artery disease status post PCI/DES to the proximal LAD (2018), hypertension, hyperlipidemia, asthma, obesity, sleep apnea that did not tolerate CPAP, DDD, history of breast cancer, fatty liver disease, macular degeneration, anxiety, who is here today for follow-up on her coronary artery disease.   Previously followed by [facility] cardiology.  Underwent cardiac catheterization in 2018 where she had successful PCI/DES to the proximal LAD.  She had residual 60% proximal left circumflex disease and 40% left main disease.  Echocardiogram completed in 2021 revealed normal left ventricular systolic function with mild LVH, normal right ventricular systolic function, trivial regurgitation with no valvular stenosis, trivial MR and TR with an LV EF of 55%.   She was last seen in clinic April 2024 by Dr. Gollan.  At that time she had complaints of shortness of breath with overexertion and upper back pain.  Her metoprolol  succinate was changed from 25-12.5 twice daily.  She was continued on Repatha  for cholesterol where is at goal.  No other medication changes were made and further testing was ordered at that time.  She returns to clinic today  ROS: 10 point review of system has been reviewed and considered negative except what is listed in the HPI  Studies Reviewed EKG Interpretation Date/Time:  Wednesday August 28 2023 15:13:19 EDT Ventricular Rate:  73 PR Interval:  154 QRS Duration:  78 QT Interval:  388 QTC Calculation: 427 R Axis:   -29  Text Interpretation: Normal sinus rhythm Minimal voltage criteria for LVH, may be normal variant ( R in aVL ) Left axis deviation When compared  with ECG of 28-Jan-2023 13:13, PREVIOUS ECG IS PRESENT No significant change since last tracing Confirmed by Gerard Frederick ([id number]) on 08/28/2023 3:20:14 PM    2D echo ROSELLEN) 09/21/2019 NORMAL LEFT VENTRICULAR SYSTOLIC FUNCTION WITH MILD LVH NORMAL RIGHT VENTRICULAR SYSTOLIC FUNCTION TRIVIAL REGURGITATION NOTED (See above) NO VALVULAR STENOSIS TRIVIAL MR, TR EF 55%  Risk Assessment/Calculations           Physical Exam VS:  BP 134/78 (BP Location: Right Arm, Patient Position: Sitting, Cuff Size: Large)   Pulse 73   Ht 5' 2 (1.575 m)   Wt 216 lb 12.8 oz (98.3 kg)   SpO2 94%   BMI 39.65 kg/m        Wt Readings from Last 3 Encounters:  08/28/23 216 lb 12.8 oz (98.3 kg)  01/28/23 209 lb (94.8 kg)  11/01/22 210 lb (95.3 kg)    GEN: Well nourished, well developed in no acute distress NECK: No JVD; No carotid bruits CARDIAC: RRR, no murmurs, rubs, gallops RESPIRATORY:  Clear to auscultation without rales, wheezing or rhonchi  ABDOMEN: Soft, non-tender, non-distended EXTREMITIES:  No edema; No deformity   ASSESSMENT AND PLAN Coronary artery disease of the native coronary arteries with stable angina.  Prior stenting to the proximal LAD with residual left circumflex disease and left main disease in 2018.  Patient states that over the last year she has probably taken Nitrostat  3 times says she notices an increased heart rate and fluttering in her chest when she gets stressed out.  Other than that she denies any chest comfort chest  discomfort any anginal anginal equivalent symptoms.  EKG today reveals sinus rhythm with a rate of 73, LVH, left axis deviation, with no ischemic changes noted today.  She is continued on aspirin  81 mg daily and Repatha  140 mg injection every 2 weeks.  No further ischemic testing is needed at this time.  Primary hypertension with a blood pressure of 130/70 on repeat today.  She is continued on Toprol -XL 25 mg daily.  She has been encouraged to continue to monitor her  blood pressure 1 to 2 hours postmedication administration at home as well.  Mixed hyperlipidemia with an LDL of 37.  She remains below goal.  She is continued on Repatha  140 mg every 2 weeks.  Unfortunately her triglycerides were over 300 and she was advised to make dietary changes.  She has upcoming lab work next month with her PCP.  If her triglycerides continue to remain elevated can consider starting her on a fenofibrate or Vascepa 2 g twice daily.  Morbid obesity with a BMI of 39.65.  She would benefit from weight loss.  Sleep apnea not on CPAP but she reports that is was not tolerated.       Dispo: Patient to return to clinic to see MD/APP in 11 to 12 months or sooner if needed for further evaluation.  Signed, Mckensey Berghuis, NP

## 2023-08-28 NOTE — Patient Instructions (Signed)
 Medication Instructions:  Your physician recommends that you continue on your current medications as directed. Please refer to the Current Medication list given to you today.   *If you need a refill on your cardiac medications before your next appointment, please call your pharmacy*  Lab Work: No labs ordered today  If you have labs (blood work) drawn today and your tests are completely normal, you will receive your results only by: MyChart Message (if you have MyChart) OR A paper copy in the mail If you have any lab test that is abnormal or we need to change your treatment, we will call you to review the results.  Testing/Procedures: No test ordered today   Follow-Up: At [facility], you and your health needs are our priority.  As part of our continuing mission to provide you with exceptional heart care, our providers are all part of one team.  This team includes your primary Cardiologist (physician) and Advanced Practice Providers or APPs (Physician Assistants and Nurse Practitioners) who all work together to provide you with the care you need, when you need it.  Your next appointment:   12 month(s)  Provider:   Timothy Gollan, MD or Tylene Lunch, NP

## 2023-10-16 ENCOUNTER — Other Ambulatory Visit: Payer: Self-pay | Admitting: Cardiovascular Disease

## 2023-12-12 ENCOUNTER — Telehealth: Payer: Self-pay | Admitting: *Deleted

## 2023-12-12 NOTE — Telephone Encounter (Signed)
 I called back to the patient to let her know that she was discharged last year.  The last time that you got mammogram was with your PCP Dr..  So I was told to tell you you would have to call your PCP about what is going on right now with your on the right side.  Will come to call me back if you have questions.[phone]

## 2023-12-12 NOTE — Telephone Encounter (Signed)
 Patient says it is the arm that the lumpectomy was done and she wanted to have somebody to look at it because she has pain with arm.Linda Yang

## 2023-12-23 ENCOUNTER — Other Ambulatory Visit: Payer: Self-pay | Admitting: Cardiovascular Disease

## 2023-12-25 ENCOUNTER — Other Ambulatory Visit: Payer: Self-pay

## 2023-12-25 ENCOUNTER — Telehealth: Payer: Self-pay | Admitting: Cardiovascular Disease

## 2023-12-25 NOTE — Telephone Encounter (Signed)
*  STAT* If patient is at the pharmacy, call can be transferred to refill team.   1. Which medications need to be refilled? (please list name of each medication and dose if known) metoprolol    2. Would you like to learn more about the convenience, safety, & potential cost savings by using the [facility]?    3. Are you open to using the [facility] (Type [facility]. ).   4. Which pharmacy/location (including street and city if local pharmacy) is medication to be sent to?[place], [place]   5. Do they need a 30 day or 90 day supply? 90 day

## 2023-12-25 NOTE — Telephone Encounter (Signed)
 Returned pt's call inquiring about Rx refill for Metoprolol  Succinate (Toprol  XL); no answer; left message on VM (per DPR); informed patient that her prescription for Metoprolol  Succinate (Toprol  XL) 25 mg had been refilled today (11/5) 90 day supply with 2 refills sent to [facility] in [place].

## 2023-12-26 ENCOUNTER — Telehealth: Payer: Self-pay | Admitting: Emergency Medicine

## 2023-12-26 NOTE — Telephone Encounter (Signed)
 Pt in lobby with husband c/o The pt is saying her heart was beating really fast this morning and felt like she couldn't breathe she wanted a nurse to check her out per front desk  Pt when asked about her meds reports being out of her metoprolol  and taking PRN nitroglycerin  to calm her heart  Mechanism and purpose of nitroglycerin  explained, pt offered refill of metoprolol  (ran out approx 4 days ago), pt unable to provide HR and BP (has machine but didn't use it), pt counseled on providing this information, pended metoprolol  script found and refilled

## 2024-03-10 ENCOUNTER — Encounter: Admitting: Dermatology

## 2024-03-12 ENCOUNTER — Encounter: Payer: Self-pay | Admitting: Dermatology

## 2024-03-12 ENCOUNTER — Ambulatory Visit: Admitting: Dermatology

## 2024-03-12 DIAGNOSIS — C44319 Basal cell carcinoma of skin of other parts of face: Secondary | ICD-10-CM | POA: Diagnosis not present

## 2024-03-12 DIAGNOSIS — L720 Epidermal cyst: Secondary | ICD-10-CM

## 2024-03-12 DIAGNOSIS — W908XXA Exposure to other nonionizing radiation, initial encounter: Secondary | ICD-10-CM | POA: Diagnosis not present

## 2024-03-12 DIAGNOSIS — D485 Neoplasm of uncertain behavior of skin: Secondary | ICD-10-CM | POA: Diagnosis not present

## 2024-03-12 DIAGNOSIS — Z1283 Encounter for screening for malignant neoplasm of skin: Secondary | ICD-10-CM

## 2024-03-12 DIAGNOSIS — D1801 Hemangioma of skin and subcutaneous tissue: Secondary | ICD-10-CM

## 2024-03-12 DIAGNOSIS — L738 Other specified follicular disorders: Secondary | ICD-10-CM | POA: Diagnosis not present

## 2024-03-12 DIAGNOSIS — L578 Other skin changes due to chronic exposure to nonionizing radiation: Secondary | ICD-10-CM | POA: Diagnosis not present

## 2024-03-12 DIAGNOSIS — L820 Inflamed seborrheic keratosis: Secondary | ICD-10-CM | POA: Diagnosis not present

## 2024-03-12 DIAGNOSIS — L814 Other melanin hyperpigmentation: Secondary | ICD-10-CM | POA: Diagnosis not present

## 2024-03-12 DIAGNOSIS — D229 Melanocytic nevi, unspecified: Secondary | ICD-10-CM

## 2024-03-12 DIAGNOSIS — L821 Other seborrheic keratosis: Secondary | ICD-10-CM | POA: Diagnosis not present

## 2024-03-12 DIAGNOSIS — L853 Xerosis cutis: Secondary | ICD-10-CM

## 2024-03-12 DIAGNOSIS — C4491 Basal cell carcinoma of skin, unspecified: Secondary | ICD-10-CM

## 2024-03-12 HISTORY — DX: Basal cell carcinoma of skin, unspecified: C44.91

## 2024-03-12 NOTE — Patient Instructions (Addendum)
 Biopsy Wound Care Instructions  Leave the original bandage on for 24 hours if possible.  If the bandage becomes soaked or soiled before that time, it is OK to remove it and examine the wound.  A small amount of post-operative bleeding is normal.  If excessive bleeding occurs, remove the bandage, place gauze over the site and apply continuous pressure (no peeking) over the area for 30 minutes. If this does not work, please call our clinic as soon as possible or page your doctor if it is after hours.   Once a day, cleanse the wound with soap and water. It is fine to shower. After washing, apply petroleum jelly (Vaseline) or an antibiotic ointment if your doctor prescribed one for you, followed by a bandage.    For best healing, the wound should be covered with a layer of ointment at all times. If you are not able to keep the area covered with a bandage to hold the ointment in place, this may mean re-applying the ointment several times a day.  Continue this wound care until the wound has healed and is no longer open.   Itching and mild discomfort is normal during the healing process. However, if you develop pain or severe itching, please call our office.   If you have any discomfort, you can take Tylenol  (acetaminophen ) or ibuprofen as directed on the bottle. (Please do not take these if you have an allergy to them or cannot take them for another reason).  Some redness, tenderness and white or yellow material in the wound is normal healing.  If the area becomes very sore and red, or develops a thick yellow-green material (pus), it may be infected; please notify us .    If you have stitches, return to clinic as directed to have the stitches removed. You will continue wound care for 2-3 days after the stitches are removed.   Wound healing continues for up to one year following surgery. It is not unusual to experience pain in the scar from time to time during the interval.  If the pain becomes severe or the  scar thickens, you should notify the office.    A slight amount of redness in a scar is expected for the first six months.  After six months, the redness will fade and the scar will soften and fade.  The color difference becomes less noticeable with time.  If there are any problems, return for a post-op surgery check at your earliest convenience.  To improve the appearance of the scar, you can use silicone scar gel, cream, or sheets (such as Mederma or Serica) every night for up to one year. These are available over the counter (without a prescription).  Please call our office at [phone] for any questions or concerns.    Recommend starting moisturizer with exfoliant (Urea, Salicylic acid, or Lactic acid) one to two times daily to help smooth rough and bumpy skin.  OTC options include Cetaphil Rough and Bumpy lotion (Urea), Eucerin Roughness Relief lotion or spot treatment cream (Urea), CeraVe SA lotion/cream for Rough and Bumpy skin (Sal Acid), Gold Bond Rough and Bumpy cream (Sal Acid), and AmLactin 12% lotion/cream (Lactic Acid).  If applying in morning, also apply sunscreen to sun-exposed areas, since these exfoliating moisturizers can increase sensitivity to sun.  Gentle Skin Care Guide  1. Bathe no more than once a day.  2. Avoid bathing in hot water  3. Use a mild soap like Dove, Vanicream, Cetaphil, CeraVe. Can use Lever  2000 or Cetaphil antibacterial soap  4. Use soap only where you need it. On most days, use it under your arms, between your legs, and on your feet. Let the water rinse other areas unless visibly dirty.  5. When you get out of the bath/shower, use a towel to gently blot your skin dry, don't rub it.  6. While your skin is still a little damp, apply a moisturizing cream such as Vanicream, CeraVe, Cetaphil, Eucerin, Sarna lotion or plain Vaseline Jelly. For hands apply Neutrogena Norwegian Hand Cream or Excipial Hand Cream.  7. Reapply moisturizer any time you  start to itch or feel dry.  8. Sometimes using free and clear laundry detergents can be helpful. Fabric softener sheets should be avoided. Downy Free & Gentle liquid, or any liquid fabric softener that is free of dyes and perfumes, it acceptable to use  9. If your doctor has given you prescription creams you may apply moisturizers over them    Melanoma ABCDEs  Melanoma is the most dangerous type of skin cancer, and is the leading cause of death from skin disease.  You are more likely to develop melanoma if you: Have light-colored skin, light-colored eyes, or red or blond hair Spend a lot of time in the sun Tan regularly, either outdoors or in a tanning bed Have had blistering sunburns, especially during childhood Have a close family member who has had a melanoma Have atypical moles or large birthmarks  Early detection of melanoma is key since treatment is typically straightforward and cure rates are extremely high if we catch it early.   The first sign of melanoma is often a change in a mole or a new dark spot.  The ABCDE system is a way of remembering the signs of melanoma.  A for asymmetry:  The two halves do not match. B for border:  The edges of the growth are irregular. C for color:  A mixture of colors are present instead of an even brown color. D for diameter:  Melanomas are usually (but not always) greater than 6mm - the size of a pencil eraser. E for evolution:  The spot keeps changing in size, shape, and color.  Please check your skin once per month between visits. You can use a small mirror in front and a large mirror behind you to keep an eye on the back side or your body.   If you see any new or changing lesions before your next follow-up, please call to schedule a visit.  Please continue daily skin protection including broad spectrum sunscreen SPF 30+ to sun-exposed areas, reapplying every 2 hours as needed when you're outdoors.    Due to recent changes in healthcare  laws, you may see results of your pathology and/or laboratory studies on MyChart before the doctors have had a chance to review them. We understand that in some cases there may be results that are confusing or concerning to you. Please understand that not all results are received at the same time and often the doctors may need to interpret multiple results in order to provide you with the best plan of care or course of treatment. Therefore, we ask that you please give us  2 business days to thoroughly review all your results before contacting the office for clarification. Should we see a critical lab result, you will be contacted sooner.   If You Need Anything After Your Visit  If you have any questions or concerns for your doctor, please call our  main line at [phone] and press option 4 to reach your doctor's medical assistant. If no one answers, please leave a voicemail as directed and we will return your call as soon as possible. Messages left after 4 pm will be answered the following business day.   You may also send us  a message via MyChart. We typically respond to MyChart messages within 1-2 business days.  For prescription refills, please ask your pharmacy to contact our office. Our fax number is [phone].  If you have an urgent issue when the clinic is closed that cannot wait until the next business day, you can page your doctor at the number below.    Please note that while we do our best to be available for urgent issues outside of office hours, we are not available 24/7.   If you have an urgent issue and are unable to reach us , you may choose to seek medical care at your doctor's office, retail clinic, urgent care center, or emergency room.  If you have a medical emergency, please immediately call 911 or go to the emergency department.  Pager Numbers  - Dr. Hester: [phone]  - Dr. Jackquline: [phone]  - Dr. Claudene: [phone]   - Dr. Raymund: [phone]  In the  event of inclement weather, please call our main line at [phone] for an update on the status of any delays or closures.  Dermatology Medication Tips: Please keep the boxes that topical medications come in in order to help keep track of the instructions about where and how to use these. Pharmacies typically print the medication instructions only on the boxes and not directly on the medication tubes.   If your medication is too expensive, please contact our office at [phone] option 4 or send us  a message through MyChart.   We are unable to tell what your co-pay for medications will be in advance as this is different depending on your insurance coverage. However, we may be able to find a substitute medication at lower cost or fill out paperwork to get insurance to cover a needed medication.   If a prior authorization is required to get your medication covered by your insurance company, please allow us  1-2 business days to complete this process.  Drug prices often vary depending on where the prescription is filled and some pharmacies may offer cheaper prices.  The website www.goodrx.com contains coupons for medications through different pharmacies. The prices here do not account for what the cost may be with help from insurance (it may be cheaper with your insurance), but the website can give you the price if you did not use any insurance.  - You can print the associated coupon and take it with your prescription to the pharmacy.  - You may also stop by our office during regular business hours and pick up a GoodRx coupon card.  - If you need your prescription sent electronically to a different pharmacy, notify our office through [facility] or by phone at [phone] option 4.     Si Usted Necesita Algo Despus de Su Visita  Tambin puede enviarnos un mensaje a travs de Clinical Cytogeneticist. Por lo general respondemos a los mensajes de MyChart en el transcurso de 1 a 2 das hbiles.  Para  renovar recetas, por favor pida a su farmacia que se ponga en contacto con nuestra oficina. Randi lakes de fax es [place] [phone].  Si tiene un asunto urgente cuando la clnica est cerrada y que no puede esperar teacher, adult education  el siguiente da hbil, puede llamar/localizar a su doctor(a) al nmero que aparece a continuacin.   Por favor, tenga en cuenta que aunque hacemos todo lo posible para estar disponibles para asuntos urgentes fuera del horario de [place], no estamos disponibles las 24 horas del da, los 7 [address]  [address] de la [place].   Si tiene un problema urgente y no puede comunicarse con nosotros, puede optar por buscar atencin mdica  en el consultorio de su doctor(a), en una clnica privada, en un centro de atencin urgente o en una sala de emergencias.  Si tiene engineer, drilling, por favor llame inmediatamente al 911 o vaya a la sala de emergencias.  Nmeros de bper  - Dr. Hester: [phone]  - Dra. Jackquline: [phone]  - Dr. Claudene: [phone]  - Dra. Kitts: [phone]  En caso de inclemencias del [place], por favor llame a nuestra lnea principal al [phone] para una actualizacin sobre el estado de cualquier retraso o cierre.  Consejos para la medicacin en dermatologa: Por favor, guarde las cajas en las que vienen los medicamentos de uso tpico para ayudarle a seguir las instrucciones sobre dnde y cmo usarlos. Las farmacias generalmente imprimen las instrucciones del medicamento slo en las cajas y no directamente en los tubos del [place].   Si su medicamento es muy caro, por favor, pngase en contacto con landry rieger llamando al [phone] y presione la opcin 4 o envenos un mensaje a travs de Clinical Cytogeneticist.   No podemos decirle cul ser su copago por los medicamentos por adelantado ya que esto es diferente dependiendo de la cobertura de su seguro. Sin embargo, es posible que podamos encontrar un medicamento sustituto a audiological scientist un formulario para  que el seguro cubra el medicamento que se considera necesario.   Si se requiere una autorizacin previa para que su compaa de seguros cubra su medicamento, por favor permtanos de 1 a 2 das hbiles para completar este proceso.  Los precios de los medicamentos varan con frecuencia dependiendo del environmental consultant de dnde se surte la receta y alguna farmacias pueden ofrecer precios ms baratos.  El sitio web www.goodrx.com tiene cupones para medicamentos de health and safety inspector. Los precios aqu no tienen en cuenta lo que podra costar con la ayuda del seguro (puede ser ms barato con su seguro), pero el sitio web puede darle el precio si no utiliz tourist information centre manager.  - Puede imprimir el cupn correspondiente y llevarlo con su receta a la farmacia.  - Tambin puede pasar por nuestra oficina durante el horario de atencin regular y education officer, museum una tarjeta de cupones de GoodRx.  - Si necesita que su receta se enve electrnicamente a una farmacia diferente, informe a nuestra oficina a travs de MyChart de [place] o por telfono llamando al [phone] y presione la opcin 4.

## 2024-03-12 NOTE — Progress Notes (Signed)
 "  Follow-Up Visit   Subjective  Linda Yang is a 84 y.o. female who presents for the following: Skin Cancer Screening and Full Body Skin Exam  The patient presents for Total-Body Skin Exam (TBSE) for skin cancer screening and mole check. The patient has spots, moles and lesions to be evaluated, some may be new or changing and the patient may have concern these could be cancer.  No hx skin cancer. Patient with a few rough spots at face and elbow that are bothersome.   The following portions of the chart were reviewed this encounter and updated as appropriate: medications, allergies, medical history  Review of Systems:  No other skin or systemic complaints except as noted in HPI or Assessment and Plan.  Objective  Well appearing patient in no apparent distress; mood and affect are within normal limits.  A full examination was performed including scalp, head, eyes, ears, nose, lips, neck, chest, axillae, abdomen, back, buttocks, bilateral upper extremities, bilateral lower extremities, hands, feet, fingers, toes, fingernails, and toenails. All findings within normal limits unless otherwise noted below.   Relevant physical exam findings are noted in the Assessment and Plan.  R lateral eyebrow x 1, R elbow x 1 (2) Erythematous stuck-on, waxy papule or plaque left malar cheek- medial 5 mm indistinct pink pearly thin papule   Assessment & Plan   SKIN CANCER SCREENING PERFORMED TODAY.  ACTINIC DAMAGE - Chronic condition, secondary to cumulative UV/sun exposure - diffuse scaly erythematous macules with underlying dyspigmentation - Recommend daily broad spectrum sunscreen SPF 30+ to sun-exposed areas, reapply every 2 hours as needed.  - Staying in the shade or wearing long sleeves, sun glasses (UVA+UVB protection) and wide brim hats (4-inch brim around the entire circumference of the hat) are also recommended for sun protection.  - Call for new or changing lesions.  LENTIGINES,  SEBORRHEIC KERATOSES, HEMANGIOMAS - Benign normal skin lesions - Benign-appearing - Call for any changes  MELANOCYTIC NEVI - Tan-brown and/or pink-flesh-colored symmetric macules and papules - Benign appearing on exam today - Observation - Call clinic for new or changing moles - Recommend daily use of broad spectrum spf 30+ sunscreen to sun-exposed areas.   Milia - tiny firm white papule at left nasal root - type of cyst - benign - sometimes these will clear with nightly OTC adapalene/Differin 0.1% gel or retinol. - may be extracted if symptomatic - observe  Sebaceous Hyperplasia - Small yellow papules with a central dell at left malar cheek- lateral, see photo, not biopsied today - Benign-appearing - Observe. Call for changes.  Xerosis - diffuse xerotic patches - recommend gentle, hydrating skin care - gentle skin care handout given Recommend starting moisturizer with exfoliant (Urea, Salicylic acid, or Lactic acid) one to two times daily to help smooth rough and bumpy skin.  OTC options include Cetaphil Rough and Bumpy lotion (Urea), Eucerin Roughness Relief lotion or spot treatment cream (Urea), CeraVe SA lotion/cream for Rough and Bumpy skin (Sal Acid), Gold Bond Rough and Bumpy cream (Sal Acid), and AmLactin 12% lotion/cream (Lactic Acid).  If applying in morning, also apply sunscreen to sun-exposed areas, since these exfoliating moisturizers can increase sensitivity to sun.   INFLAMED SEBORRHEIC KERATOSIS (2) R lateral eyebrow x 1, R elbow x 1 (2) Symptomatic, irritating, patient would like treated.  Benign-appearing.  Call clinic for new or changing lesions.   - Destruction of lesion - R lateral eyebrow x 1, R elbow x 1 (2)  Destruction method: cryotherapy  Informed consent: discussed and consent obtained   Lesion destroyed using liquid nitrogen: Yes   Region frozen until ice ball extended beyond lesion: Yes   Outcome: patient tolerated procedure well with no  complications   Post-procedure details: wound care instructions given   Additional details:  Prior to procedure, discussed risks of blister formation, small wound, skin dyspigmentation, or rare scar following cryotherapy. Recommend Vaseline ointment to treated areas while healing.   NEOPLASM OF UNCERTAIN BEHAVIOR OF SKIN left malar cheek- medial - Skin / nail biopsy Type of biopsy: tangential   Informed consent: discussed and consent obtained   Patient was prepped and draped in usual sterile fashion: Area prepped with alcohol. Anesthesia: the lesion was anesthetized in a standard fashion   Anesthetic:  1% lidocaine  w/ epinephrine  1-100,000 buffered w/ 8.4% NaHCO3 Instrument used: flexible razor blade   Hemostasis achieved with: pressure, aluminum chloride and electrodesiccation   Outcome: patient tolerated procedure well   Post-procedure details: wound care instructions given   Post-procedure details comment:  Ointment and small bandage applied  Specimen 1 - Surgical pathology Differential Diagnosis: Sebaceous Hyperplasia r/o BCC  Check Margins: No  Return in about 1 year (around 03/12/2025) for TBSE, with Dr. Jackquline.  LILLETTE Lonell Drones, RMA, am acting as scribe for Rexene Jackquline, MD .   Documentation: I have reviewed the above documentation for accuracy and completeness, and I agree with the above.  Rexene Jackquline, MD   "

## 2024-03-17 ENCOUNTER — Ambulatory Visit: Payer: Self-pay | Admitting: Dermatology

## 2024-03-17 DIAGNOSIS — C44319 Basal cell carcinoma of skin of other parts of face: Secondary | ICD-10-CM

## 2024-03-17 LAB — SURGICAL PATHOLOGY

## 2024-03-18 ENCOUNTER — Encounter: Payer: Self-pay | Admitting: Dermatology

## 2024-03-18 NOTE — Telephone Encounter (Signed)
-----   Message from Rexene Rattler, MD sent at 03/17/2024  5:27 PM EST ----- 1. Skin, left malar cheek :       BASAL CELL CARCINOMA, SUPERFICIAL, NODULAR AND INFILTRATIVE PATTERNS, BASE       INVOLVED   BCC skin cancer- recommend Mohs surgery with Dr. Corey - please call patient

## 2024-03-18 NOTE — Telephone Encounter (Signed)
 Patient advised of BX results and referral information sent to Dr. Corey.

## 2024-03-18 NOTE — Telephone Encounter (Signed)
 Left pt msg to call for bx results/sh

## 2024-03-18 NOTE — Addendum Note (Signed)
 Addended by: TERESA PALMA R on: 03/18/2024 12:23 PM   Modules accepted: Orders

## 2025-03-15 ENCOUNTER — Encounter: Admitting: Dermatology
